# Patient Record
Sex: Female | Born: 1965 | Race: White | Hispanic: No | Marital: Married
Health system: Midwestern US, Academic
[De-identification: ages and names within clinical notes are randomized; demographics above are authoritative.]

## PROBLEM LIST (undated history)

## (undated) DIAGNOSIS — F1010 Alcohol abuse, uncomplicated: Secondary | ICD-10-CM

## (undated) HISTORY — PX: GASTRIC BYPASS: SHX52

## (undated) LAB — HM PAP SMEAR: HM Pap smear: NORMAL

---

## 2011-08-23 ENCOUNTER — Ambulatory Visit: Payer: PRIVATE HEALTH INSURANCE

## 2011-08-30 ENCOUNTER — Ambulatory Visit: Admit: 2011-08-30 | Payer: PRIVATE HEALTH INSURANCE

## 2011-08-30 DIAGNOSIS — Z0000 Encounter for general adult medical examination without abnormal findings: Secondary | ICD-10-CM

## 2011-08-30 MED ORDER — fluticasone (FLONASE) 50 mcg/actuation nasal spray
50 | Freq: Every day | NASAL | Status: AC
Start: 2011-08-30 — End: 2012-07-23

## 2011-08-30 MED ORDER — ALPRAZolam (XANAX) 0.25 MG tablet
0.25 | ORAL_TABLET | ORAL | Status: AC
Start: 2011-08-30 — End: 2012-07-23

## 2011-08-30 MED ORDER — ZOLMitriptan (ZOMIG) 5 MG tablet
5 | ORAL_TABLET | ORAL | 1.00 refills | 28.00000 days | Status: AC | PRN
Start: 2011-08-30 — End: 2014-09-28

## 2011-08-30 MED ORDER — valACYclovir (VALTREX) 1000 MG tablet
1000 | ORAL_TABLET | ORAL | Status: AC
Start: 2011-08-30 — End: 2012-07-23

## 2011-08-30 NOTE — Unmapped (Signed)
Subjective  HPI:   Patient ID: Sherry Compton is a 45 y.o. female.    Chief Complaint:  HPI       Needs to establish MD    Has not had a physical for many yrs    C/o migraine headache ,5-6 times a year   Usually excedrin helps but not in market any more  She has taken her sister's zomig which helps    Gets fever blisters 6-8 times a year,last time she received a cream  Andit did not help the sore    C/o anxiety, more lately  Her husband had  An heart attack recently  She received anxiety pills to help he fly   h/o allergies all her life,sxworse recenlty and thinks it is because she has got older  C/o watery  ,runny eyes ,runny nose,has throat itchy  Recently  got sinus cold medicine with not much help         ROS:   Review of Systems   Constitutional: Negative for fever, activity change, appetite change, fatigue and unexpected weight change.   HENT: Negative for ear pain, congestion, sneezing, postnasal drip and ear discharge.    Eyes: Negative for itching.   Respiratory: Negative for cough, chest tightness and wheezing.    Cardiovascular: Negative for chest pain, palpitations and leg swelling.   Gastrointestinal: Negative for nausea, vomiting and abdominal pain.   Genitourinary: Negative for urgency and decreased urine volume.   Musculoskeletal: Negative for back pain.   Neurological: Positive for headaches. Negative for tremors, seizures, syncope, speech difficulty, light-headedness and numbness.   Hematological: Negative for adenopathy.   Psychiatric/Behavioral: Negative for confusion, disturbed wake/sleep cycle and dysphoric mood.          Objective:   Physical Exam   Constitutional: She is oriented to person, place, and time. She appears well-developed and well-nourished.   HENT:   Head: Normocephalic and atraumatic.   Right Ear: External ear normal.   Left Ear: External ear normal.   Nose: Nose normal.   Mouth/Throat: Oropharynx is clear and moist. No oropharyngeal exudate.        Nares boggy and congested      Eyes: Conjunctivae are normal. Pupils are equal, round, and reactive to light.   Neck: Normal range of motion. Neck supple.   Cardiovascular: Normal rate, regular rhythm, normal heart sounds and intact distal pulses.    No murmur heard.  Pulmonary/Chest: Effort normal and breath sounds normal. No respiratory distress. She has no wheezes. She exhibits no tenderness.   Abdominal: Soft. Bowel sounds are normal. She exhibits no distension and no mass. There is no tenderness. There is no rebound and no guarding.   Musculoskeletal: Normal range of motion. She exhibits no edema and no tenderness.   Neurological: She is alert and oriented to person, place, and time. She has normal reflexes. She displays normal reflexes. No cranial nerve deficit. She exhibits normal muscle tone. Coordination normal.   Skin: Skin is warm.   Psychiatric: She has a normal mood and affect. Her behavior is normal. Judgment and thought content normal.             Filed Vitals:    08/30/11 1534   BP: 120/80   Pulse: 68   Temp: 97.7 ??F (36.5 ??C)   TempSrc: Oral   Height: 5' 9 (1.753 m)   Weight: 255 lb (115.667 kg)   SpO2: 99%     Body mass index is 37.66 kg/(m^2).  Body  surface area is 2.37 meters squared.                Assessment/Plan:     Anxiety rx for xanaxgiven  Headache ,migraine zomig rx given    Allergic rhinits rx for flonase given    feve rblisters rx for valtrex  Follow up for cpe

## 2011-08-30 NOTE — Unmapped (Signed)
Take zomig at start of headache    Take dAILY ZYRTEC 10mg   And flonase    Xanax as needed prior to flight  Valtrex as directed for feve rblisters  F/u for cpe

## 2012-07-23 ENCOUNTER — Ambulatory Visit: Admit: 2012-07-23 | Payer: PRIVATE HEALTH INSURANCE

## 2012-07-23 DIAGNOSIS — J019 Acute sinusitis, unspecified: Secondary | ICD-10-CM

## 2012-07-23 MED ORDER — valACYclovir (VALTREX) 1000 MG tablet
1000 | ORAL_TABLET | Freq: Two times a day (BID) | ORAL | Status: AC
Start: 2012-07-23 — End: 2014-03-01

## 2012-07-23 MED ORDER — ALPRAZolam (XANAX) 0.25 MG tablet
0.25 | ORAL_TABLET | ORAL | Status: AC
Start: 2012-07-23 — End: 2014-03-01

## 2012-07-23 NOTE — Unmapped (Signed)
Subjective  HPI:   Patient ID: Sherry Compton is a 46 y.o. female.    Chief Complaint:  HPI Comments: Fever blisters in mouth - prev used valtrex. Worked well. Needs refills.    Sinus - cough prod green stuff. 4 days. Runny nose. Sinus pressure.  Nothing otc    Going on trip soon - caving. Anxious. Would like refill of xanax due to anxiety going in [place].            Sinusitis    Anxiety                        ROS:   Review of Systems  No fevers, chills, sob, wheezing, n/v/d.     Objective:   Physical Exam      nad  unlabored  ctab  Rrr, no m  Sinuses ttp  Rhinorrhea, boggy turbinates  Tm and canals normal bilat, normal ext ears.  Tearful affect    Filed Vitals:    07/23/12 1305   BP: 110/76   Pulse: 84   Temp: 98.5 ??F (36.9 ??C)   TempSrc: Oral   Height: 5' 9 (1.753 m)   Weight: 276 lb (125.193 kg)   SpO2: 98%     Body mass index is 40.76 kg/(m^2).  Body surface area is 2.47 meters squared.                Assessment/Plan:   There is no problem list on file for this patient.  Acute sinusitis - handout on otc remedies for symptoms. D/w pt no need for abx. Call if persisting after 14 days and I'll call in an abx.    Anxiety - Rx refill x1 of xanax. F/u with Dr. August Saucer to discuss anxiety more.    Herpes labialis - valtrex refill.    Reviewed medications with the patient and she understands how to take them and has no barriers to medication compliance.

## 2012-07-23 NOTE — Unmapped (Signed)
Patient needs refill on fever blister medication, she has no idea what its called     [facility], [address]  [phone]

## 2012-07-23 NOTE — Unmapped (Signed)
Call if not improving by 2 weeks.    Please follow up with Dr. August Saucer to discuss depression and anxiety.

## 2012-10-21 NOTE — Unmapped (Signed)
Orders inc hart

## 2012-10-21 NOTE — Unmapped (Signed)
Patient is requesting lab orders because she wants to get them done before her appt tomorrow at 10am and she said dr. August Saucer had ordered labs for her last year but they are expired and she never had them done so she basically just wants to get those done. She would like a phone call letting her know this has been entered into the system because she is planning to go there in the morning before the appt. She is going to [facility] lab

## 2012-10-22 ENCOUNTER — Ambulatory Visit: Admit: 2012-10-22 | Payer: PRIVATE HEALTH INSURANCE

## 2012-10-22 ENCOUNTER — Inpatient Hospital Stay: Admit: 2012-10-22 | Payer: PRIVATE HEALTH INSURANCE

## 2012-10-22 DIAGNOSIS — F32A Depression, unspecified: Secondary | ICD-10-CM

## 2012-10-22 DIAGNOSIS — Z0000 Encounter for general adult medical examination without abnormal findings: Secondary | ICD-10-CM

## 2012-10-22 LAB — HEMOGLOBIN A1C: Hemoglobin A1C: 4.8 % (ref 4.8–6.4)

## 2012-10-22 LAB — LIPID PANEL
Cholesterol, Total: 164 mg/dL (ref 0–200)
HDL: 50 mg/dL (ref 30–60)
LDL Cholesterol: 90 mg/dL (ref 0–160)
Triglycerides: 119 mg/dL (ref 10–150)

## 2012-10-22 LAB — CBC
Hematocrit: 39.2 % (ref 35.0–45.0)
Hemoglobin: 12.9 g/dL (ref 11.7–15.5)
MCH: 30.6 pg (ref 27.0–33.0)
MCHC: 32.8 g/dL (ref 32.0–36.0)
MCV: 93.2 fL (ref 80.0–100.0)
MPV: 8.3 fL (ref 7.5–11.5)
Platelets: 250 10*3/uL (ref 140–400)
RBC: 4.2 10*6/uL (ref 3.80–5.10)
RDW: 14.5 % (ref 11.0–15.0)
WBC: 7.2 10*3/uL (ref 3.8–10.8)

## 2012-10-22 LAB — VITAMIN D 25 HYDROXY: Vit D, 25-Hydroxy: 19 ng/mL (ref 30.0–100)

## 2012-10-22 LAB — TSH: TSH: 2.53 m[IU]/L (ref 0.45–4.50)

## 2012-10-22 MED ORDER — FLUoxetine (PROZAC) 20 MG capsule
20 | ORAL_CAPSULE | Freq: Every day | ORAL | Status: AC
Start: 2012-10-22 — End: 2012-11-12

## 2012-10-22 NOTE — Unmapped (Signed)
Pt here now for an appt with dr August Saucer

## 2012-10-22 NOTE — Unmapped (Signed)
lmtcb

## 2012-10-22 NOTE — Unmapped (Addendum)
Start prozac,schedule for psychology appt.  If not affordable  Continue your present peer to peer counseling.  F/u in 4 weeks

## 2012-10-22 NOTE — Unmapped (Signed)
Subjective  HPI:   Patient ID: Sherry Compton is a 46 y.o. female.    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  She has 46 yr old in school ,married last yr majoring in Environmental manager.ever since he got married she thinks her dtr in law  turned him away from family,they never come down to visit them  Initially he rdtr in law was very nice to them and then there was an incident at he rgraduation party in may and since then thinks chamged.  They are [place] family and very close knit family.  She thought this was separation and has been counseling with a Librarian, academic and not  gettign better and feels she has lost he rson and wants to let go and knows he will come back ,he is immature and they will come back to her  She still calls her son once  In 1-2 weeks  Her son and dtr in law  visit her dtr in law's sideof family who live in [place] and drive past their home and do not drop by to see them.  NAd if they come home it is for few minutes  When she asked her son why the do not  Visit them  he said he is not comfortable at their home.    Past Medical History   Diagnosis Date   ??? Anxiety      Past Surgical History   Procedure Laterality Date   ??? Augmentation breast endoscopic       History     Social History   ??? Marital Status: Married     Spouse Name: N/A     Number of Children: N/A   ??? Years of Education: N/A     Occupational History   ??? Not on file.     Social History Main Topics   ??? Smoking status: Former Smoker   ??? Smokeless tobacco: Not on file   ???  Alcohol Use: No   ??? Drug Use: No   ??? Sexually Active: Not on file     Other Topics Concern   ??? Caffeine Use Yes   ??? Exercise No   ??? Seat Belt Yes     Social History Narrative   ??? No narrative on file     Family History   Problem Relation Age of Onset   ??? Migraines Sister          Medications:  Current Outpatient Prescriptions   Medication Sig Dispense Refill   ??? ALPRAZolam (XANAX) 0.25 MG tablet 1-2 tablets 1/2 hr prior to flight  30 tablet  0   ??? valACYclovir (VALTREX) 1000 MG tablet Take 1 tablet (1,000 mg total) by mouth 2 times a day. For 3 days  6 tablet  2   ??? ZOLMitriptan (ZOMIG) 5 MG tablet Take 1 tablet (5  mg total) by mouth as needed for Migraine.  10 tablet  5     No current facility-administered medications for this visit.        ROS:   Review of Systems   Constitutional: Negative for fatigue.   Respiratory: Negative for shortness of breath.    Cardiovascular: Negative for chest pain and palpitations.   Psychiatric/Behavioral: Positive for sleep disturbance and dysphoric mood. The patient is nervous/anxious. The patient is not hyperactive.           Objective:   Physical Exam   Vitals reviewed.  Constitutional: She is oriented to person, place, and time. She appears well-developed and well-nourished.   HENT:   Head: Normocephalic and atraumatic.   Eyes: Conjunctivae and EOM are normal. Pupils are equal, round, and reactive to light.   Neck: Normal range of motion. Neck supple.   Cardiovascular: Normal rate, regular rhythm, normal heart sounds and intact distal pulses.    No murmur heard.  Pulmonary/Chest: Effort normal and breath sounds normal.   Abdominal: Soft.   Neurological: She is alert and oriented to person, place, and time.   Psychiatric: Her speech is normal and behavior is normal. Judgment and thought content normal. Her mood appears anxious. Cognition and memory are normal.   tearful             Filed Vitals:    10/22/12 0957   BP: 124/78   Pulse: 79   Temp: 97.4 ??F (36.3 ??C)   TempSrc: Oral    Height: 5' 9 (1.753 m)   Weight: 293 lb (132.904 kg)   SpO2: 98%     Body mass index is 43.25 kg/(m^2).  Body surface area is 2.54 meters squared.           See ph9  Score       Assessment/Plan:     Depression with adjustment disorder  See pt isntructions  Total time spent with pt 09-1044 am of which more than 50% in ftfc/cc  Pt given counseling   Recommended psychotherpay  rx for prozac given

## 2012-10-22 NOTE — Unmapped (Signed)
Patient says dr. August Saucer gave her a prescription but she does not have it. She wants to know if it was being sent to the pharmacy and where it was sent. She only has her appt summary but no prescription.

## 2012-10-23 NOTE — Unmapped (Signed)
LMTCB.  Prozac was refilled on 10/30 to [facility] on [address].

## 2012-10-23 NOTE — Unmapped (Signed)
Pt informed rx sent to [facility]

## 2012-11-12 MED ORDER — FLUoxetine (PROZAC) 20 MG capsule
20 | ORAL_CAPSULE | Freq: Two times a day (BID) | ORAL | Status: AC
Start: 2012-11-12 — End: 2014-09-28

## 2012-11-12 NOTE — Unmapped (Signed)
Was supposed to take once aday ,does she want to continue 2 times daily ot one time a day.

## 2012-11-12 NOTE — Unmapped (Signed)
Pt states she was taking the med once a day for 2 weeks and it didn't help at all, so she try taking it bid and she feels much better. Advised pt to f/u with dr August Saucer at her scheduled time and she would sent a refill to the pharm.

## 2012-11-12 NOTE — Unmapped (Signed)
Wanting to clarify the dosage for this patient from dr August Saucer for   FLUoxetine (PROZAC) 20 MG capsule patient is stating she is taking two a day, and now trying to get her script filled and it is too early,because the prescription states she should be taking only (1) a day.

## 2014-03-01 ENCOUNTER — Ambulatory Visit: Admit: 2014-03-01 | Payer: PRIVATE HEALTH INSURANCE

## 2014-03-01 MED ORDER — ZOLMitriptan (ZOMIG) 5 MG tablet
5 | ORAL_TABLET | Freq: Once | ORAL | 1.00 refills | 28.00000 days | Status: AC | PRN
Start: 2014-03-01 — End: 2014-09-28

## 2014-03-01 MED ORDER — ALPRAZolam (XANAX) 0.25 MG tablet
0.25 | ORAL_TABLET | ORAL | Status: AC
Start: 2014-03-01 — End: 2014-09-28

## 2014-03-01 MED ORDER — valACYclovir (VALTREX) 1000 MG tablet
1000 | ORAL_TABLET | Freq: Two times a day (BID) | ORAL | Status: AC
Start: 2014-03-01 — End: 2015-02-07

## 2014-03-01 NOTE — Unmapped (Signed)
Schedule appt to see bariatric  Doctor     continue valtrex and  zomig  F/u for physical

## 2014-03-01 NOTE — Unmapped (Signed)
Subjective  HPI:   Patient ID: Sherry Compton is a 48 y.o. female.    Chief Complaint:  HPI       Here for follow up   needs refill  Fever blister gets one in every 8 weeks     flying to [place] soon and would like refill of her ativan   wondering  about her diet   used adipex in past  H/o migraine doing ok needs refill  Past Medical History   Diagnosis Date   ??? Anxiety      Past Surgical History   Procedure Laterality Date   ??? Augmentation breast endoscopic       History     Social History   ??? Marital Status: Married     Spouse Name: N/A     Number of Children: N/A   ??? Years of Education: N/A     Occupational History   ??? Not on file.     Social History Main Topics   ??? Smoking status: Former Smoker   ??? Smokeless tobacco: Not on file   ??? Alcohol Use: No   ??? Drug Use: No   ??? Sexual Activity: Not on file     Other Topics Concern   ??? Caffeine Use Yes   ??? Exercise No   ??? Seat Belt Yes     Social History Narrative   ??? No narrative on file     Family History   Problem Relation Age of Onset   ??? Migraines Sister    ??? Diabetes Paternal Grandmother    ??? Heart disease Paternal Grandmother    ??? Miscarriages / Stillbirths Paternal Grandmother          Medications:  Current Outpatient Prescriptions   Medication Sig Dispense Refill   ??? ALPRAZolam (XANAX) 0.25 MG tablet 1-2 tablets 1/2 hr prior to flight  30 tablet  0   ??? FLUoxetine (PROZAC) 20 MG capsule Take 1 capsule (20 mg total) by mouth 2 times a day.  60 capsule  1   ??? valACYclovir (VALTREX) 1000 MG tablet Take 1 tablet (1,000 mg total) by mouth 2 times a day. For 3 days  6 tablet  2   ??? ZOLMitriptan (ZOMIG) 5 MG tablet Take 5 mg by mouth once as needed for Migraine.       ??? ZOLMitriptan (ZOMIG) 5 MG tablet Take 1 tablet (5 mg total) by mouth as needed for Migraine.  10 tablet  5     No current facility-administered medications for this visit.        ROS:   Review of Systems   Constitutional: Negative for fever and fatigue.   Respiratory: Negative for chest tightness and  shortness of breath.    Neurological: Negative for dizziness and light-headedness.          Objective:   Physical Exam   Vitals reviewed.  Constitutional: She appears well-developed and well-nourished.   Cardiovascular: Normal rate, regular rhythm, normal heart sounds and intact distal pulses.    No murmur heard.  Pulmonary/Chest: Effort normal and breath sounds normal.   Skin:   Fever blister on uppe rlip   Psychiatric: She has a normal mood and affect. Her behavior is normal.             Filed Vitals:    03/01/14 1002   BP: 118/88   Pulse: 88   Temp: 98.1 ??F (36.7 ??C)   TempSrc: Oral   Height: 5' 9.5 (  1.765 m)   Weight: 296 lb (134.265 kg)   SpO2: 98%     Body mass index is 43.1 kg/(m^2).  Body surface area is 2.57 meters squared.                Assessment/Plan:     Fever blister  rx for valtrex given   migraines controlled refilled zomig    Obesity referral given   anxiety to [place] rx given

## 2014-09-13 NOTE — Unmapped (Signed)
Called stating is requesting medical records from dr August Saucer

## 2014-09-13 NOTE — Unmapped (Signed)
Ok  Make sure she signs ror

## 2014-09-14 NOTE — Unmapped (Signed)
LM-pt must sign release of records and let us know who we will be forwarding the records to.

## 2014-09-22 NOTE — Unmapped (Signed)
Pt called back -  I informed her that the best way to do this would be for her to sign a records release stating that we're sending records to a new doc.  She is going to come in and sign the release.

## 2014-09-28 ENCOUNTER — Ambulatory Visit: Admit: 2014-09-28 | Payer: PRIVATE HEALTH INSURANCE

## 2014-09-28 DIAGNOSIS — E663 Overweight: Secondary | ICD-10-CM

## 2014-09-28 MED ORDER — ALPRAZolam (XANAX) 0.25 MG tablet
0.25 | ORAL_TABLET | ORAL | Status: AC
Start: 2014-09-28 — End: 2015-02-07

## 2014-09-28 MED ORDER — ZOLMitriptan (ZOMIG) 5 MG tablet
5 | ORAL_TABLET | ORAL | 1.00 refills | 28.00000 days | Status: AC | PRN
Start: 2014-09-28 — End: 2014-11-15

## 2014-09-28 NOTE — Unmapped (Signed)
Subjective  HPI:   Patient ID: Sherry Compton is a 48 y.o. female.    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 Anxiety      Past Surgical History   Procedure Laterality Date   ??? Augmentation breast endoscopic       History     Social History   ??? Marital Status: Married     Spouse Name: N/A     Number of Children: N/A   ??? Years of Education: N/A     Occupational History   ??? Not on file.     Social History Main Topics   ??? Smoking status: Former Smoker   ??? Smokeless tobacco: Not on file   ??? Alcohol Use: No   ??? Drug Use: No   ??? Sexual Activity: Not on file     Other Topics Concern   ??? Caffeine Use Yes   ??? Exercise No   ??? Seat Belt Yes     Social History Narrative   ??? No narrative on file     Family History   Problem Relation Age of Onset   ??? Migraines Sister    ??? Diabetes Paternal Grandmother    ??? Heart disease Paternal Grandmother    ??? Miscarriages / Stillbirths Paternal Grandmother          Medications:  Current Outpatient Prescriptions   Medication Sig Dispense Refill   ??? ALPRAZolam (XANAX) 0.25 MG tablet 1-2 tablets 1/2 hr prior to flight  30 tablet  2   ??? valACYclovir (VALTREX) 1000 MG tablet Take 1 tablet (1,000 mg total) by mouth 2 times a day. For 3 days  30 tablet  2   ??? ZOLMitriptan (ZOMIG) 5 MG tablet Take 1 tablet (5 mg total) by mouth as needed for Migraine.  10 tablet  5   ??? [DISCONTINUED] ZOLMitriptan (ZOMIG) 5 MG tablet Take 1 tablet (5 mg total) by mouth as needed for Migraine.  10 tablet  5     No current facility-administered medications for this visit.        ROS:   Review of  Systems   Constitutional: Positive for weight gain. Negative for weight loss and fatigue.   Gastrointestinal: Negative for abdominal pain.   Neurological: Negative for dizziness and headaches.          Objective:   Physical Exam   Vitals reviewed.  Constitutional: She is oriented to person, place, and time. She appears well-developed and well-nourished.   Neck: Normal range of motion. Neck supple.   Cardiovascular: Normal rate, regular rhythm, normal heart sounds and intact distal pulses.    No murmur heard.  Pulmonary/Chest: Effort normal and breath sounds normal.   Abdominal: Soft.   Neurological: She is alert and oriented to person, place, and time.   Psychiatric: She has a normal mood and affect. Her behavior is normal. Judgment and thought content normal.  Filed Vitals:    09/28/14 1515   BP: 128/76   Pulse: 72   Temp: 98.4 ??F (36.9 ??C)   TempSrc: Oral   Height: 5' 9 (1.753 m)   Weight: 292 lb (132.45 kg)   SpO2: 98%     Body mass index is 43.1 kg/(m^2).  Body surface area is 2.54 meters squared.                Assessment/Plan:   Anxiety controlled ,prn xanax   has not been taking prozac  Morbid obesity referrla to bariatric surgery given    Migraines doing well using zomig as needed

## 2014-09-28 NOTE — Unmapped (Signed)
F/u in 3months for physical

## 2014-11-15 ENCOUNTER — Ambulatory Visit: Admit: 2014-11-15 | Payer: PRIVATE HEALTH INSURANCE

## 2014-11-15 ENCOUNTER — Inpatient Hospital Stay: Admit: 2014-11-15 | Payer: PRIVATE HEALTH INSURANCE

## 2014-11-15 DIAGNOSIS — M1711 Unilateral primary osteoarthritis, right knee: Secondary | ICD-10-CM

## 2014-11-15 DIAGNOSIS — M25561 Pain in right knee: Secondary | ICD-10-CM

## 2014-11-15 MED ORDER — naproxen (NAPROSYN) 500 MG tablet
500 | ORAL_TABLET | Freq: Two times a day (BID) | ORAL | Status: AC
Start: 2014-11-15 — End: 2015-02-07

## 2014-11-15 MED ORDER — ketorolacTORADOLinjection30mg
30 | Freq: Once | INTRAMUSCULAR | Status: AC
Start: 2014-11-15 — End: 2014-11-15
  Administered 2014-11-15: 17:00:00 30 mg via INTRAMUSCULAR

## 2014-11-15 MED ORDER — ketorolac (TORADOL) injection 30 mg
30 | Freq: Four times a day (QID) | INTRAMUSCULAR | Status: AC | PRN
Start: 2014-11-15 — End: 2014-11-15

## 2014-11-15 NOTE — Unmapped (Signed)
Get r knee xray today   ice compress 10 min 3-4 times  aday    wait for xray results

## 2014-11-15 NOTE — Unmapped (Signed)
Subjective  HPI:   Patient ID: Sherry Compton is a 48 y.o. female.    Chief Complaint:  HPI       Chief Complaint   Patient presents with   ??? Knee Pain     48 y.o female here with c/o knee pain due to injury on Thursday     Started a new job at [employer]   she works 5hrs shifts  Works in fashion department organizing stuff  2 days in row   on Thursday woke up with r knee pain  Went throughthe day    it hurt,felt like it was popped out   hurt when she slept   did not know what she did   went to work   on Saturday ,bent over to pick up stuff  r knee pain hit her again  Took ibuprofen 2 every 5-6 hrs with some releif  Pain is 8/10  When she gets up and wants on ti it subsides   layign down on bwd hurts  Past Medical History   Diagnosis Date   ??? Anxiety      Past Surgical History   Procedure Laterality Date   ??? Augmentation breast endoscopic       History     Social History   ??? Marital Status: Married     Spouse Name: N/A     Number of Children: N/A   ??? Years of Education: N/A     Occupational History   ??? Not on file.     Social History Main Topics   ??? Smoking status: Former Smoker   ??? Smokeless tobacco: Not on file   ??? Alcohol Use: No   ??? Drug Use: No   ??? Sexual Activity: Not on file     Other Topics Concern   ??? Caffeine Use Yes   ??? Exercise No   ??? Seat Belt Yes     Social History Narrative     Family History   Problem Relation Age of Onset   ??? Migraines Sister    ??? Diabetes Paternal Grandmother    ??? Heart disease Paternal Grandmother    ??? Miscarriages / Stillbirths Paternal Grandmother          Medications:  Current Outpatient Prescriptions   Medication Sig Dispense Refill   ??? ALPRAZolam (XANAX) 0.25 MG tablet 1-2 tablets 1/2 hr prior to flight 30 tablet 2   ??? valACYclovir (VALTREX) 1000 MG tablet Take 1 tablet (1,000 mg total) by mouth 2 times a day. For 3 days 30 tablet 2   ??? [DISCONTINUED] ZOLMitriptan (ZOMIG) 5 MG tablet Take 1 tablet (5 mg total) by mouth as needed for Migraine. 10 tablet 5     No current  facility-administered medications for this visit.        ROS:   Review of Systems   Constitutional: Negative for fatigue.   Musculoskeletal: Positive for myalgias, joint swelling and arthralgias. Negative for gait problem.   Neurological: Negative for weakness and numbness.          Objective:   Physical Exam   Vitals reviewed.  Constitutional: She appears well-developed and well-nourished.   Cardiovascular: Normal rate, regular rhythm, normal heart sounds and intact distal pulses.    No murmur heard.  Pulmonary/Chest: Effort normal and breath sounds normal.   Musculoskeletal: She exhibits edema and tenderness.   r knee swollen   no redness  rom decreased sec to pain  Filed Vitals:    11/15/14 1141   BP: 110/82   Pulse: 93   Temp: 98.5 ??F (36.9 ??C)   Weight: 297 lb 8 oz (134.945 kg)   SpO2: 97%     Body mass index is 43.91 kg/(m^2).  There is no height on file to calculate BSA.                Assessment/Plan:     r knee pain    poss strain  ? Arthritis  See pt instructions

## 2014-11-30 NOTE — Progress Notes (Signed)
[facility] [address] [place], [place] [address]    Acknowledgment of Informed Consent for Surgical or Medical Procedure and Sedation  I agree to allow doctor(s) TRACE W CURRY and his/her associates or assistants, including residents and/or other qualified medical practitioner to perform the following medical treatment or procedure and to administer or direct the administration of sedation as necessary:  Procedure(s) : LAPAROSCOPIC SLEEVE GASTRECTOMY WITH POSSIBLE HIATAL HERNIA REPAIR  My doctor has explained the following regarding the proposed procedure:  ??? the explanation of the procedure  ??? the benefits of the procedure  ??? the potential problems that might occur during recuperation  ??? the risks and side effects of the procedure which could include but are not limited to severe blood loss, infection, stroke or death  ??? the benefits, risks and side effect of alternative procedures including the consequences of declining this procedure or any alternative procedures  ??? the likelihood of achieving satisfactory results.  I acknowledge no guarantee or assurance has been made to me regarding the results.    I understand that during the course of this treatment/procedure, unforeseen conditions can occur which require an additional or different procedure.  I agree to allow my physician or assistants to perform such extension of the original procedure as they may find necessary.    I understand that sedation will often result in temporary impairment of memory and fine motor skills and that sedation can occasionally progress to a state of deep sedation or general anesthesia.    I understand the risks of anesthesia for surgery include, but are not limited to, sore throat, hoarseness, injury to face, mouth, or teeth; nausea; headache; injury to blood vessels or nerves; death, brain damage, or paralysis.    I understand that if I have a Limitation of Treatment order in effect during my  hospitalization, the order may or may not be in effect during this procedure.     I give my doctor permission to give me blood or blood products.  I understand that there are risks with receiving blood such as hepatitis, AIDS, fever, or allergic reaction.  I acknowledge that the risks, benefits, and alternatives of this treatment have been explained to me and that no express or implied warranty has been given by the hospital, any blood bank, or any person or entity as to the blood or blood components transfused.    At the discretion of my doctor, I agree to allow observers, equipment/product representatives and allow photographing, and/or televising of the procedure, provided my name or identity is maintained confidentially.      I agree the hospital may dispose of or use for scientific or educational purposes any tissue, fluid, or body parts which may be removed.    ________________________________Date________Time______ am/pm  (Circle One)  Patient or Signature of Closest Relative or Legal Guardian    ________________________________Date________Time______am/pm      Page 1 of ____  Witness

## 2014-12-02 ENCOUNTER — Inpatient Hospital Stay: Admit: 2014-12-02 | Attending: Surgery

## 2014-12-02 LAB — CBC
Hematocrit: 37 % (ref 36.0–48.0)
Hemoglobin: 12.1 g/dL (ref 12.0–16.0)
MCH: 28.4 pg (ref 26.0–34.0)
MCHC: 32.7 g/dL (ref 31.0–36.0)
MCV: 87 fL (ref 80.0–100.0)
MPV: 7.2 fL (ref 5.0–10.5)
Platelets: 297 10*3/uL (ref 135–450)
RBC: 4.25 M/uL (ref 4.00–5.20)
RDW: 15.7 % — ABNORMAL HIGH (ref 12.4–15.4)
WBC: 6.2 10*3/uL (ref 4.0–11.0)

## 2014-12-02 LAB — COMPREHENSIVE METABOLIC PANEL
ALT: 11 U/L (ref 10–40)
AST: 13 U/L — ABNORMAL LOW (ref 15–37)
Albumin/Globulin Ratio: 1.4 (ref 1.1–2.2)
Albumin: 3.8 g/dL (ref 3.4–5.0)
Alkaline Phosphatase: 67 U/L (ref 40–129)
Anion Gap: 13 (ref 3–16)
BUN: 13 mg/dL (ref 7–20)
CO2: 23 mmol/L (ref 21–32)
Calcium: 8.7 mg/dL (ref 8.3–10.6)
Chloride: 102 mmol/L (ref 99–110)
Creatinine: 0.7 mg/dL (ref 0.6–1.1)
GFR African American: >60 (ref >60)
GFR Non-African American: >60 (ref >60)
Globulin: 2.7 g/dL
Glucose: 100 mg/dL — ABNORMAL HIGH (ref 70–99)
Potassium: 4.2 mmol/L (ref 3.5–5.1)
Sodium: 138 mmol/L (ref 136–145)
Total Bilirubin: <0.2 mg/dL (ref 0.0–1.0)
Total Protein: 6.5 g/dL (ref 6.4–8.2)

## 2014-12-02 LAB — TYPE AND SCREEN
ABO/Rh: A NEG
Antibody Screen: NEGATIVE

## 2014-12-02 NOTE — Progress Notes (Signed)
The following educational items and goals will be achieved upon completion of the patient's Pre-admission testing appointment:             Identify the learner who is being assessed for education:  Patient                       Ability to Learn:  Exhibits ability to grasp concepts and respond to questions: High  Ready to Learn: Yes  calm   Preferred Method of Learning:  verbal  Barriers to Learning: Verbalizes interest  Special Considerations due to cultural, religious, spiritual beliefs:  No  Language:  English  Language Interpreter:  No    [place]  _0  Appropriate evaluation / integration of data as delineated by ASPAN Standards of Perianesthesia Nursing Practice    Pain scale and pain management   _1 Patient verbalizes understanding of pain scale and pain management  _2 Pre-operative determination of patient???s anticipated Post-Operative pain goal:   4 of 10 on 10 point scale post op goal  _3  Other     Medication(s) - Compliance with preop medication instructions  _4  Patient verbalizes understanding of preop medications (see [facility] Presurgical Instructions)    Instructions, Pre op                                                                                            _5  Patient verbalizes understanding of presurgical instructions as reviewed with phone interview nurse or in-person nurse review    Fall Risk Potential, Preoperatively                                                                                   _6 No preoperative risk identified  _7 Preop risk identified:                    _8 Sensory deficit        _9 Motor deficit        _10 Balance problem        _11 Home medication        _12 Uses assistive device                    _13 History of a Fall within the last 30 days    Goal(s) for fall prevention:  _14 Prevent fall or injury by requesting assistance with activities of daily living  _15 Patient / Significant other verbalizes understanding the need to call for  assistance prior to getting out of bed during hospitalization      Infection Precautions                                                                                            [  x] Patient understands implementation of Surgical Site Infection precautions (see [facility] Pres[facility] Instructions)     Patient Safety  [x]  Patient identification verified  [x]  Site verified    Instructions - Discharge Planning for Outpatients  [x]  Patient / significant other voices understanding of home care and follow up procedures  [x]  Encourage patient / significant other to review discharge instructions the day after procedure due to sedation on day of surgery    Anticipated Special Needs upon discharge:        []  Cooling device        []  Crutches       []  Walker        []  Wound Support device        []  Drain        []  Other       Instructions - Discharge Planning for Admitted patients  [x]  Patient / significant other understands plan for admission after surgery  [x]  Patient / significant other understands plan for anticipated discharge dispostion        12/02/2014 12:07 PM Tami Nichols

## 2014-12-02 NOTE — Anesthesia Pre-Procedure Evaluation (Signed)
Department of Anesthesiology  Preprocedure Note       Name:  Tami Nichols   Age:  48 y.o.  DOB:  04/01/1966                                          MRN:  [id number]         Date:  12/02/2014      Surgeon: Clementeen Grahamurry    Procedure: Laparoscopic Sleeve Gastrectomy    Medications prior to admission:   Prior to Admission medications    Medication Sig Start Date End Date Taking? Authorizing Provider   ALPRAZolam (XANAX) 0.25 MG tablet Take 0.25 mg by mouth nightly as needed for Sleep   Yes Historical Provider, MD   ZOLMitriptan (ZOMIG) 5 MG tablet Take 5 mg by mouth as needed for Migraine   Yes Historical Provider, MD   naproxen (NAPROSYN) 500 MG tablet Take 500 mg by mouth 2 times daily (with meals)   Yes Historical Provider, MD   cetirizine (ZYRTEC) 10 MG tablet Take 10 mg by mouth daily   Yes Historical Provider, MD   naltrexone-bupropion HCl ER (CONTRAVE) 8-90 MG per tablet Take 2 tablets by mouth 2 times daily   Yes Historical Provider, MD       Current medications:    Current Outpatient Prescriptions   Medication Sig Dispense Refill   ??? ALPRAZolam (XANAX) 0.25 MG tablet Take 0.25 mg by mouth nightly as needed for Sleep     ??? ZOLMitriptan (ZOMIG) 5 MG tablet Take 5 mg by mouth as needed for Migraine     ??? naproxen (NAPROSYN) 500 MG tablet Take 500 mg by mouth 2 times daily (with meals)     ??? cetirizine (ZYRTEC) 10 MG tablet Take 10 mg by mouth daily     ??? naltrexone-bupropion HCl ER (CONTRAVE) 8-90 MG per tablet Take 2 tablets by mouth 2 times daily       No current facility-administered medications for this encounter.       Allergies:  No Known Allergies    Problem List:  There is no problem list on file for this patient.      Past Medical History:        Diagnosis Date   ??? Anxiety        Past Surgical History:        Procedure Laterality Date   ??? Breast surgery       augmentation       Social History:    History   Substance Use Topics   ??? Smoking status: Never Smoker    ??? Smokeless tobacco: Not on file   ??? Alcohol Use:  No                                Counseling given: Not Answered      Vital Signs (Current):   Filed Vitals:    12/02/14 1153   Pulse: 82   Temp: 97.3 ??F (36.3 ??C)   TempSrc: Oral   Resp: 20   Height: 5\' 9"  (1.753 m)   Weight: 299 lb 5 oz (135.767 kg)   SpO2: 97%  BP Readings from Last 3 Encounters:   No data found for BP       NPO Status:                                                                                 BMI:   Wt Readings from Last 3 Encounters:   12/02/14 299 lb 5 oz (135.767 kg)     Body mass index is 44.18 kg/(m^2).    Anesthesia Evaluation  Patient summary reviewed and Nursing notes reviewed no history of anesthetic complications:   Airway: Mallampati: II     Neck ROM: full  Mouth opening: > = 3 FB Dental:          Pulmonary:       (-) pneumonia, COPD, asthma, shortness of breath, recent URI and sleep apnea       Cardiovascular:        (-) pacemaker, hypertension, valvular problems/murmurs, past MI, CAD, CABG/stent, dysrhythmias,  angina,  CHF, orthopnea, PND and  DOE             Neuro/Psych:      (-) seizures, neuromuscular disease, TIA, CVA, headaches and psychiatric history  GI/Hepatic/Renal:        (-) hiatal hernia, GERD, PUD, hepatitis, liver disease and bowel prep     Endo/Other:        (-) hypothyroidism, hyperthyroidism, blood dyscrasia    Abdominal:                    Anesthesia Plan    ASA 3     general   (48 year old female presents for laparoscopic sleeve gastrectomy.  Plan general anesthesia with ASA standard monitors.  Questions answered.  Patient agreeable with anesthetic plan.  )  intravenous induction   Anesthetic plan and risks discussed with patient.      Weyman CroonJAMES R Trey Bebee, MD   12/02/2014

## 2014-12-02 NOTE — Progress Notes (Signed)
[facility] PRE-SURGICAL TESTING INSTRUCTIONS     Date of Procedure 12-21 Time of Procedure1100    PRIOR TO PROCEDURE DATE:  1. Arrange for someone to take you home and be with you after discharge since you cannot drive after receiving sedation. Please ensure it is someone we can share information with regarding your discharge.    2. Contact your doctor for advice if:       a. You are taking any blood thinners, aspirin, anti-inflammatory or vitamin E products.       b. There is a change in your physical state such as a cold, fever, rash, cuts, sores or infection, especially near your surgical site.    3. Do not drink alcohol the day before your surgery.    4. Please follow guidelines prior to surgery for diet, medication, or preparations as advised by your doctor.    5. FOLLOW INSTRUCTIONS FOR ARRIVAL TIME AS DIRECTED BY YOUR DOCTOR.  If your doctor does not give you a specific arrival time, please arrive at 0900.    6.  If there is no one at the front desk when you arrive, please call SDS at [phone] to  let us know you have arrived.  You will not need to register again.    THE DAY OF YOUR PROCEDURE:  1. On the day of surgery, enter the MAIN entrance [address] [facility] [address] and follow the signs to the MAIN entrance and Valet parking.      2. Check in with the receptionist inside the lobby or follow the signs posted on the floor and walls to go directly into Same Day Surgery/Services located on the same floor as the lobby.  The phone number for SDS is [phone].    3. DO NOT EAT OR DRINK ANYTHING AFTER MIDNIGHT. The only exception would be a small sip of water with any medications you were told to take the morning of surgery.        a. Medication instructions for the day of surgery:          b. Please contact your Diabetic Physician to receive instructions regarding your insulin for the night before and the day of surgery.    5. Do not swallow water when brushing teeth. No gum, candy, mints or ice chips. Refrain  from smoking or at least decrease the amount.    6. Dress in loose, comfortable clothing appropriate for redressing after your procedure.  Do not wear jewelry, make-up-especially NO eye make-up, fingernail polish, lotion, powders or metal hairclips.    7. Dentures, glasses, or contacts will need to be removed before surgery. Bring cases for your glasses, contacts, dentures, or hearing aids.  If you use a CPAP, please bring it with you the day of surgery.    8. Leave any valuables at home such as credit cards, cash, cell phones and jewelry. The hospital will not be responsible for valuables that are not secured in the hospital safe.    9. You may bring a bag with personal items if you are to spend the night. Please have any large items you may need brought in by your family after your arrival to your hospital room.    10. Please bring a copy of any history and physical form, or medical reports your doctor may have given you.    11. If you have a Living Will or Durable Power of Attorney, please bring a copy on the day of your procedure.  HOW WE KEEP YOU SAFE and WORK TO PREVENT SURGICAL SITE INFECTIONS:  1. Health care workers should always check your ID bracelet to verify your name and birth date. You will be asked many times to state your name, date of birth, and allergies.    2. Health care workers should always clean their hands with soap or alcohol gel before providing care to you. It is okay to ask anyone if they cleaned their hands before they touch you.    3. You will be actively involved in verifying the type of surgery you are having and ensuring the correct surgical site is confirmed.    4. Do not shave near where you will have surgery. Shaving with a razor can irritate your skin and make it easier to develop an infection. On the day of your procedure, any hair that needs to be removed near the surgical site will be ???clipped??? by a Research scientist (physical sciences)healthcare worker using a special clippers designed to avoid skin  irritation.    5. When you are in the operating room, your surgical site will be cleansed with a special soap and in most cases you will be given an antibiotic before the surgery begins.    AFTER YOUR PROCEDURE:  1. For comfort and safety, arrange to have someone at home with you for the first 24 hours after discharge.    2. You and your family will be given written instructions about your diet, activity, dressing care, medications, and return visits.    3. Always clean your hands before and after caring for your wound.    4. Mild nausea, headache, muscle aches, sore throat, or fatigue may occur after anesthesia. Should any of these symptoms become severe, or should you notice any signs of infection, you should call your doctor.    5. Narcotic pain medications can cause significant constipation.  You may want to add a stool softener to your postoperative medication schedule or speak to your surgeon on how best to manage this SIDE EFFECT.    SPECIAL INSTRUCTIONS     ADDITIONAL INFORMATION REVIEWED:  Yes Taking Control of Your Pain   No Cardiac Surgery Instructions for AM admission to the hospital  Yes FAQs about ???Surgical Site Infections ???  No Your Guide to Hip Replacement Surgery. Please bring this booklet  back on the day of your surgery.  No Your Guide to Knee Replacement Surgery. Please bring this booklet back on the day of your surgery.  Yes Same Day Services Booklet   No Hibiclens?? Bathing Instructions   No Other    Thank you for allowing us to care for you.  We strive to exceed your expectations in the overall delivery of care and service to you and your family.    If you need to contact us for any reason, please call us at [phone]  Shandrell Boda.12/02/2014 .12:09 PM    Instructions reviewed and copy given to patient during visit.

## 2014-12-03 LAB — EKG 12-LEAD
Atrial Rate: 68 {beats}/min
P Axis: 49 degrees
P-R Interval: 172 ms
Q-T Interval: 376 ms
QRS Duration: 98 ms
QTc Calculation (Bazett): 399 ms
R Axis: 74 degrees
T Axis: 15 degrees
Ventricular Rate: 68 {beats}/min

## 2014-12-13 LAB — TYPE AND SCREEN
ABO/Rh: A NEG
Antibody Screen: NEGATIVE

## 2014-12-13 LAB — POC PREGNANCY UR-QUAL: Pregnancy, Urine: NEGATIVE

## 2014-12-13 MED ORDER — DIPHENHYDRAMINE HCL 50 MG/ML IJ SOLN
50 MG/ML | Freq: Once | INTRAMUSCULAR | Status: DC | PRN
Start: 2014-12-13 — End: 2014-12-13

## 2014-12-13 MED ORDER — HYDROMORPHONE HCL 1 MG/ML IJ SOLN
1 MG/ML | INTRAMUSCULAR | Status: DC | PRN
Start: 2014-12-13 — End: 2014-12-15

## 2014-12-13 MED ORDER — METOCLOPRAMIDE HCL 5 MG/ML IJ SOLN
5 MG/ML | Freq: Once | INTRAMUSCULAR | Status: DC
Start: 2014-12-13 — End: 2014-12-13

## 2014-12-13 MED ORDER — LACTATED RINGERS IV SOLN
INTRAVENOUS | Status: DC
Start: 2014-12-13 — End: 2014-12-13
  Administered 2014-12-13: 15:00:00 via INTRAVENOUS

## 2014-12-13 MED ORDER — NORMAL SALINE FLUSH 0.9 % IV SOLN
0.9 % | INTRAVENOUS | Status: DC | PRN
Start: 2014-12-13 — End: 2014-12-13

## 2014-12-13 MED ORDER — OXYCODONE-ACETAMINOPHEN 5-325 MG PO TABS
5-325 MG | Freq: Once | ORAL | Status: DC | PRN
Start: 2014-12-13 — End: 2014-12-13

## 2014-12-13 MED ORDER — HYDROMORPHONE HCL 1 MG/ML IJ SOLN
1 MG/ML | INTRAMUSCULAR | Status: DC | PRN
Start: 2014-12-13 — End: 2014-12-13

## 2014-12-13 MED ORDER — MIDAZOLAM HCL 2 MG/2ML IJ SOLN
2 MG/ML | Freq: Once | INTRAMUSCULAR | Status: DC | PRN
Start: 2014-12-13 — End: 2014-12-13

## 2014-12-13 MED ORDER — HYDRALAZINE HCL 20 MG/ML IJ SOLN
20 MG/ML | INTRAMUSCULAR | Status: DC | PRN
Start: 2014-12-13 — End: 2014-12-13

## 2014-12-13 MED ORDER — MEPERIDINE HCL 25 MG/ML IJ SOLN
25 MG/ML | INTRAMUSCULAR | Status: DC | PRN
Start: 2014-12-13 — End: 2014-12-13

## 2014-12-13 MED ORDER — METOCLOPRAMIDE HCL 5 MG/ML IJ SOLN
5 MG/ML | Freq: Once | INTRAMUSCULAR | Status: AC | PRN
Start: 2014-12-13 — End: 2014-12-13
  Administered 2014-12-13: 18:00:00 10 mg via INTRAVENOUS

## 2014-12-13 MED ORDER — GLYCOPYRROLATE 0.2 MG/ML IJ SOLN
0.2 MG/ML | Freq: Once | INTRAMUSCULAR | Status: DC
Start: 2014-12-13 — End: 2014-12-13

## 2014-12-13 MED ORDER — LABETALOL HCL 5 MG/ML IV SOLN
5 MG/ML | INTRAVENOUS | Status: DC | PRN
Start: 2014-12-13 — End: 2014-12-13

## 2014-12-13 MED ORDER — MORPHINE SULFATE (PF) 2 MG/ML IV SOLN
2 MG/ML | INTRAVENOUS | Status: DC | PRN
Start: 2014-12-13 — End: 2014-12-13

## 2014-12-13 MED ORDER — NORMAL SALINE FLUSH 0.9 % IV SOLN
0.9 % | Freq: Two times a day (BID) | INTRAVENOUS | Status: DC
Start: 2014-12-13 — End: 2014-12-13

## 2014-12-13 MED ORDER — SCOPOLAMINE 1 MG/3DAYS TD PT72
1 MG/3DAYS | TRANSDERMAL | Status: DC
Start: 2014-12-13 — End: 2014-12-15
  Administered 2014-12-13: 18:00:00 1 via TRANSDERMAL

## 2014-12-13 MED ORDER — LIDOCAINE HCL (PF) 1 % IJ SOLN
1 % | Freq: Once | INTRAMUSCULAR | Status: DC | PRN
Start: 2014-12-13 — End: 2014-12-13

## 2014-12-13 MED ORDER — FAMOTIDINE 20 MG/2ML IV SOLN
20 MG/2ML | Freq: Once | INTRAVENOUS | Status: DC
Start: 2014-12-13 — End: 2014-12-13

## 2014-12-13 MED ORDER — HYOSCYAMINE SULFATE 0.125 MG SL SUBL
125 MCG | SUBLINGUAL | Status: DC | PRN
Start: 2014-12-13 — End: 2014-12-15
  Administered 2014-12-14 (×2): 125 ug via SUBLINGUAL

## 2014-12-13 MED ORDER — MORPHINE SULFATE (PF) 4 MG/ML IV SOLN
4 MG/ML | INTRAVENOUS | Status: DC | PRN
Start: 2014-12-13 — End: 2014-12-13
  Administered 2014-12-13: 18:00:00 4 mg via INTRAVENOUS

## 2014-12-13 MED ORDER — ACETAMINOPHEN 325 MG PO TABS
325 MG | ORAL | Status: DC | PRN
Start: 2014-12-13 — End: 2014-12-13

## 2014-12-13 MED ORDER — DEXTROSE 5 % IV SOLN (MINI-BAG)
5 % | Freq: Once | INTRAVENOUS | Status: DC
Start: 2014-12-13 — End: 2014-12-13

## 2014-12-13 MED ORDER — LACTATED RINGERS IV SOLN
INTRAVENOUS | Status: DC
Start: 2014-12-13 — End: 2014-12-15
  Administered 2014-12-14 (×2): via INTRAVENOUS

## 2014-12-13 MED ORDER — PANTOPRAZOLE SODIUM 40 MG IV SOLR
40 MG | Freq: Every day | INTRAVENOUS | Status: DC
Start: 2014-12-13 — End: 2014-12-15
  Administered 2014-12-13 – 2014-12-15 (×3): 40 mg via INTRAVENOUS

## 2014-12-13 MED ORDER — BUPIVACAINE-EPINEPHRINE (PF) 0.5% -1:200000 IJ SOLN
INTRAMUSCULAR | Status: AC
Start: 2014-12-13 — End: ?

## 2014-12-13 MED ORDER — SODIUM CHLORIDE 0.9 % IJ SOLN
0.9 % | Freq: Every day | INTRAMUSCULAR | Status: DC
Start: 2014-12-13 — End: 2014-12-15
  Administered 2014-12-15: 13:00:00 10 mL via INTRAVENOUS

## 2014-12-13 MED ORDER — ALPRAZOLAM 0.25 MG PO TABS
0.25 MG | Freq: Every evening | ORAL | Status: DC | PRN
Start: 2014-12-13 — End: 2014-12-15

## 2014-12-13 MED ORDER — ONDANSETRON HCL 4 MG/2ML IJ SOLN
4 MG/2ML | Freq: Four times a day (QID) | INTRAMUSCULAR | Status: DC | PRN
Start: 2014-12-13 — End: 2014-12-15
  Administered 2014-12-13 – 2014-12-15 (×5): 4 mg via INTRAVENOUS

## 2014-12-13 MED ORDER — HYOSCYAMINE SULFATE 0.125 MG PO TABS
125 MCG | ORAL | Status: DC | PRN
Start: 2014-12-13 — End: 2014-12-13

## 2014-12-13 MED ORDER — FENTANYL CITRATE 0.05 MG/ML IJ SOLN
0.05 MG/ML | INTRAMUSCULAR | Status: DC | PRN
Start: 2014-12-13 — End: 2014-12-13
  Administered 2014-12-13 (×2): 25 ug via INTRAVENOUS

## 2014-12-13 MED ORDER — HYDROMORPHONE HCL 1 MG/ML IJ SOLN
1 MG/ML | INTRAMUSCULAR | Status: DC | PRN
Start: 2014-12-13 — End: 2014-12-15
  Administered 2014-12-13 – 2014-12-14 (×3): 0.5 mg via INTRAVENOUS

## 2014-12-13 MED ORDER — NORMAL SALINE FLUSH 0.9 % IV SOLN
0.9 % | Freq: Two times a day (BID) | INTRAVENOUS | Status: DC
Start: 2014-12-13 — End: 2014-12-15
  Administered 2014-12-14: 12:00:00 10 mL via INTRAVENOUS

## 2014-12-13 MED ORDER — NORMAL SALINE FLUSH 0.9 % IV SOLN
0.9 % | INTRAVENOUS | Status: DC | PRN
Start: 2014-12-13 — End: 2014-12-15

## 2014-12-13 MED FILL — PROTONIX 40 MG IV SOLR: 40 MG | INTRAVENOUS | Qty: 40

## 2014-12-13 MED FILL — FENTANYL CITRATE 0.05 MG/ML IJ SOLN: 0.05 MG/ML | INTRAMUSCULAR | Qty: 2

## 2014-12-13 MED FILL — HYDROMORPHONE HCL 1 MG/ML IJ SOLN: 1 MG/ML | INTRAMUSCULAR | Qty: 1

## 2014-12-13 MED FILL — LEVSIN 0.125 MG PO TABS: 0.125 MG | ORAL | Qty: 1

## 2014-12-13 MED FILL — ONDANSETRON HCL 4 MG/2ML IJ SOLN: 4 MG/2ML | INTRAMUSCULAR | Qty: 2

## 2014-12-13 MED FILL — MORPHINE SULFATE (PF) 4 MG/ML IV SOLN: 4 MG/ML | INTRAVENOUS | Qty: 1

## 2014-12-13 MED FILL — GLYCOPYRROLATE 0.2 MG/ML IJ SOLN: 0.2 MG/ML | INTRAMUSCULAR | Qty: 1

## 2014-12-13 MED FILL — FAMOTIDINE 20 MG/2ML IV SOLN: 20 MG/2ML | INTRAVENOUS | Qty: 2

## 2014-12-13 MED FILL — CEFTRIAXONE SODIUM 2 G IJ SOLR: 2 g | INTRAMUSCULAR | Qty: 2

## 2014-12-13 MED FILL — METOCLOPRAMIDE HCL 5 MG/ML IJ SOLN: 5 MG/ML | INTRAMUSCULAR | Qty: 2

## 2014-12-13 MED FILL — SENSORCAINE-MPF/EPINEPHRINE 0.5% -1:200000 IJ SOLN: INTRAMUSCULAR | Qty: 30

## 2014-12-13 NOTE — Progress Notes (Signed)
Rocephin  2 gr iv to or

## 2014-12-13 NOTE — Op Note (Signed)
Operative Note    Patient: Tami Nichols  DOB: 1966/06/22    Surgery date:  12-13-14     [facility]     PREOPERATIVE DIAGNOSIS:  Morbid obesity  POSTOPERATIVE DIAGNOSIS:  Morbid obesity   BMI:   43.68  PROCEDURE PERFORMED: Sleeve gastrectomy  (laparoscopic) 36 French     Surgeon:   Joneen Boersrace W.  Jamerson Vonbargen, MD  Assistant: Dr. Jarvis NewcomerNathan Roberts    Anesthesia: GETA  Estimated Blood Loss: 10 cc's  Complications: None      Indications for procedure:  This is an obese patient who came to see me in the office in consultation for weight loss surgery.  This patient is a good candidate for the procedure and has been thoroughly educated on the risks and benefits of the procedure including the possibility of bleeding, infection, and even death.  The patient has been extensively educated on post operative dietary requirements and the lifestyle changes needed for a successful outcome.  The patient has made a commitment to make the necessary dietary and lifestyle changes for a successful outcome.  Risks, benefits, and alternatives were discsussed and the patient expresses full understanding and desires to proceed.   Procedure Details:  The patient was taken to the operating room and placed on the table in the supine position.  General endotracheal anesthesia was administered and the abdomen was prepped and draped in the usual sterile fashion.  Compression boots were in place.  An incision was made above the umbilicus and access was gained using an Optiview non-bladed trocar.  The abdomen was insufflated with carbon dioxide to 18 mm of mercury.  The remaining trocars were inserted in standard position under direct visualization without complication.  Adhesions were taken down as needed to facilitate exposure.   The patient was placed in slight reverse Trendelenburg position and a liver retractor was placed.     The peritoneum was incised at the Angle of His for mark the cephalad aspect of our dissection. The distal  aspect of dissection was started about 5cm proximal to the pylorus by dividing the attachments of the greater omentum to the greature curvature of the stomach with the harmonic scalpel.  We proceeded with this dissection all the way up to the angle of His until we had the entire greater curve freed up.  We also freed up all posterior attachments all the way over to the lesser curve.    The gastrectomy was initiated 6cm from the pylorus firing transversely using the Ethicon 60mm linear cutter.  Seamguard was used for staple line reinforcement.     After that firing we passed a  Bougie down into the initiated sleeve and pulled it up snugly against the lesser curve.  We then followed the Bougie with repeated firings of the 60mm stapler until we completed the gastrectomy entirely.    The specimen was removed through a 15mm trocar and sent to pathology.    We inspected the staple lines and they all looked good and there was no bleeding.      We achieved meticulous hemostasis, withdrew the trocars, and closed the incisions with 4-0 Monocryl. Benzoin, steri strips, and sterile dressings were applied.  The patient was then awakened, extubated, and taken to the recovery room in stable condition.  There were no complications to this procedure and at the end all counts were correct.              SIGNED  Delos Haringrace Naja Apperson MD  Dictated: 12-13-14           11:18    cc:  Dr.  Maree ErieSrilakshmi Murthy, [address], [place], [phone]

## 2014-12-13 NOTE — H&P (Signed)
[employer]    [id number]    [facility] Same Day Surgery Update H & P  Department of General Surgery   Surgical Service   CNP Pre-operative History and Physical  Last H & P within the last 30 days.    DIAGNOSIS:   E66.01 MORBID OBESTIY, K45.8 HIAT    PROCEDURE:  Laparoscopic Sleeve Gastrectomy With Possible Hernia Repair      HISTORY OF PRESENT ILLNESS: Pt. Is a 48 y.o morbidly obese. Female who has been unsuccessful in losing weight with conservative treatment. Pt. Is now her for surgical intervention.     Please see initial H & P     Past Medical History:        Diagnosis Date   ??? Anxiety      Past Surgical History:        Procedure Laterality Date   ??? Breast surgery       augmentation     Past Social History:  History     Social History   ??? Marital Status: Married     Spouse Name: N/A     Number of Children: N/A   ??? Years of Education: N/A     Social History Main Topics   ??? Smoking status: Never Smoker    ??? Smokeless tobacco: None   ??? Alcohol Use: No   ??? Drug Use: None   ??? Sexual Activity: None     Other Topics Concern   ??? None     Social History Narrative         Medications Prior to Admission:      Prior to Admission medications    Medication Sig Start Date End Date Taking? Authorizing Provider   ALPRAZolam (XANAX) 0.25 MG tablet Take 0.25 mg by mouth nightly as needed for Sleep   Yes Historical Provider, MD   naproxen (NAPROSYN) 500 MG tablet Take 500 mg by mouth 2 times daily (with meals)   Yes Historical Provider, MD   cetirizine (ZYRTEC) 10 MG tablet Take 10 mg by mouth daily   Yes Historical Provider, MD   ZOLMitriptan (ZOMIG) 5 MG tablet Take 5 mg by mouth as needed for Migraine    Historical Provider, MD   naltrexone-bupropion HCl ER (CONTRAVE) 8-90 MG per tablet Take 2 tablets by mouth 2 times daily    Historical Provider, MD         Allergies:  Review of patient's allergies indicates no known allergies.    PHYSICAL EXAM:      BP 175/94 mmHg   Pulse 76   Temp(Src) 97.4 ??F (36.3 ??C)   Resp 18   Ht  5\' 9"  (1.753 m)   Wt 299 lb (135.626 kg)   BMI 44.13 kg/m2   SpO2 100%   LMP 11/23/2014     Heart:  regular rate and rhythm,no murmur     Lungs:  No increased work of breathing, good air exchange, clear to auscultation bilaterally, no crackles or wheezing    Abdomen:  soft, non-distended, non-tender, no rebound tenderness or guarding, normal active bowel sounds and no masses palpated    ASSESSMENT AND PLAN:    1.  Patient seen and focused exam done today- no new changes since last physical exam on 11/15/14    2.  Access to ancillary services are available per request of the provider.    Marijean HeathKathleen Sotero Brinkmeyer, CNP     12/13/2014

## 2014-12-13 NOTE — Progress Notes (Signed)
PACU Transfer Note    Filed Vitals:    12/13/14 1315   BP: 152/91   Pulse: 64   Temp:    Resp: 21   SpO2: 95%       In: 1300 [I.V.:1300]  Out: 10     Pain assessment:  VS stable. [facility] at bedside. Skin check completed. Patient to transfer to room #5304-02 via bed as scheduled. Family updated.  Pain Level: 7 (abdomen ache)    Report given to Receiving unit RN.    12/13/2014 1:39 PM

## 2014-12-13 NOTE — Progress Notes (Signed)
[facility]  Patient resting in bed with eyes closed appears sleeping with VSS, moving freely in bed.

## 2014-12-13 NOTE — Anesthesia Post-Procedure Evaluation (Signed)
Anesthesia Post-op Note    Name: Tami Nichols   DOB: 08/02/1966    MRN: [id number]   Procedure(s) Performed: Sleeve gastrectomy  (laparoscopic) 3036 French        Date of Surgery: 12/13/14  Anesthesia type: General      POST-OP ASSESSMENT::    ?? Anesthetic problems: No  ?? Vitals (most recent):  height is 5\' 9"  (1.753 m) and weight is 299 lb (135.626 kg). Her temporal temperature is 97.6 ??F (36.4 ??C). Her blood pressure is 152/91 and her pulse is 64. Her respiration is 21 and oxygen saturation is 95%.   ?? Cardiovascular system stable: Yes  ?? Respiratory function:  airway patent: Yes  ventilator and/or ETT: No  ?? Hydration adequate: Yes    ?? Nausea: No  ?? Level of consciousness: awake, alert and oriented  ?? Post-op pain control: Adequate  ?? Other:    assessment completed and signed @:   Dola ArgyleJames R. Effie Shyoleman, MD  December 13, 2014 2:01 PM

## 2014-12-13 NOTE — Discharge Summary (Signed)
Physician Discharge Summary     Patient ID:  Tami Nichols  [id number]  48 y.o.  02/19/1966    Admit date: 12/13/2014    Discharge date and time: 12/15/14    Admitting Physician: Joneen Boersrace W Curry, MD     Discharge Physician: Joneen Boersrace W Curry, MD    Admission Diagnoses: E66.01 MORBID OBESTIY, K45.8 HIAT    Discharge Diagnoses: Morbid Obesity    Admission Condition: fair    Discharged Condition: stable    Indication for Admission: Surgery:  Laparoscopic Sleeve Gastrectomy, IV hydration, monitoring, pain and nausea management    Hospital Course: 48 y.o. female admitted with morbid obesity who underwent laparoscopic sleeve gastrectomy.  Surgery was uneventful and she was admitted to bariatric post-operative surgical floor in stable condition for IV hydration, monitoring and pain and nausea management.  The following morning the pain was tolerable on po pain medication but she was having nausea with po intake.  She stayed overnight for continued nausea and pain control.  She was discharged in stable condition on POD2.      Treatments: IV hydration, IV pain control, IV nausea control        Disposition: home    Patient Instructions:   @MEDDISCHARGE @  Activity: activity as tolerated and no driving while on analgesics  Diet: clear liquids  Wound Care: as directed    Follow-up with Dr. Clementeen Grahamurry in 1-2 weeks as scheduled      Signed:  Rufina FalcoDianne Elizabeth Nichols  12/13/2014  2:43 PM

## 2014-12-13 NOTE — Progress Notes (Signed)
Patient admitted to PACU # 15 from OR post Lap Gastrectomy per Dr. Clementeen Grahamurry.  Attached to PACU monitoring system and report received from CRNA.  Patient was hemodynamically stable during surgical procedure.  Patient arrived in PACU very drowsy and with no evidence of pain.

## 2014-12-13 NOTE — Progress Notes (Addendum)
Department of Surgery  Post Op Note    Objective:Resting in bed.  Complaining of "gas pains".  Denies any nausea or vomiting.  Denies CP or SOF  Anesthesia type: General      I/O    Intra op    Post op     Fluids    1100  ml    300  ml     EBL       10  ml      n/a     Urine       n/a      n/a       Exam: VITALS:  BP 146/75 mmHg   Pulse 68   Temp(Src) 97.8 ??F (36.6 ??C) (Oral)   Resp 18   Ht 5\' 9"  (1.753 m)   Wt 299 lb (135.626 kg)   BMI 44.13 kg/m2   SpO2 97%   LMP 11/23/2014  Post-op vital signs:  Stable     Exam:General appearance: alert, appears stated age and cooperative  Lungs: clear to auscultation bilaterally  Heart: regular rate and rhythm, S1, S2 normal, no murmur, click, rub or gallop  Abdomen: soft, appropriately tender, 5 trocar sites with gauze and medipore dressing.  Minimal staining on dressings 1, 2 and 4.  No active signs of bleeding present.  Hypoactive bowel sounds      Assessment and Plan  Pt is a 48 year old female s/p laparoscopic sleeve gastrectomy POD #0    Pain management: continue with dilaudid  FeNa:  Diet - bariatric clears , Fluids LR @ 125 ml/hour   GU:  Patient has not urinated.  Will continue to monitor closely  Ambulation: OOB to chair  Respiratory:  IS at bedside, encourage hourly IS and deep breathing  Prophylaxis: AC boots    Stable post op check    D. [place] RidingCahill NP-C  [phone]  Resident Support Staff

## 2014-12-13 NOTE — Plan of Care (Signed)
Problem: Pain:  Goal: Pain level will decrease  Pain level will decrease   Outcome: Ongoing  Complaining of "gas" pain rated 9 on a 0-10 scale.  Instructed patient on turning and repositioning and ambulating.  Medicated with Dilaudid 0.5 mg IV x 1 at this time.

## 2014-12-13 NOTE — Progress Notes (Signed)
[facility] PACU Education and Care Plan Goals  The following items will be achieved upon completion of the patient's transfer or discharge from the PACU:    Post Operative Pain Management                                                                               _0  Patient will verbalize understanding of pain scale and pain management.  _1  Patient achieves predetermined pain goal of 4.  _2  Self reports a comfort level acceptable for discharge to in patient floor.  _3  Other     Fall Risk Potential  _4  Due to Perioperative medication administration  Additional Risk Identified:   _5  Sensory deficit         _6  Motor deficit         _7  Balance problem         _8  Home medication         _9  Uses assistive device to ambulate    Goal(s) for fall prevention:  _10  Prevent fall or injury by calling for assistance with activity and use of siderails while hospitalized  _11  Prevent fall or injury by using assistance with activity after discharge.  _12  Patient / Significant other verbalize understanding in use of any ordered assistive devices    Mobility Safety/ ADL  _13  Reach a functional mobility goal within limitations of the procedure.    Infection Precautions                                                                                                            _14 Patient understands implementation of infection precautions (see [facility] Presurgical Instructions and SSI Prevention Handout)    Post operative Assessment and Care                                                             _15  Standards of care met as delineated by ASPAN.                                                              Discharge Education and Goals  _16 Patient voices understanding of PACU discharge criteria  _17 Outpatient / significant other voices understanding of home care and follow up procedures (See [facility] Procedure Discharge Instructions)  _18  Patient / significant other understanding of Special Needs:  _19  Cooling  device  _20   Wound Support Device  _0  Crutches   _1 Drain    _2  Walker   _3 Other:  Ice pack to abdomen/bilateral BK SCD's on   _4  Inpatient / significant other understands the plan for transfer to the inpatient unit

## 2014-12-13 NOTE — Brief Op Note (Signed)
Brief Postoperative Note    Tami SkyeAmber Nichols  Date of Birth:  08/03/1966  [id number]    Pre-operative Diagnosis: morbid obesity    Post-operative Diagnosis: Same    Procedure:  Laparoscopic sleeve gastrectomy     Anesthesia: General    Surgeons/Assistants: curry, Raiden Haydu     Estimated Blood Loss: less than 50     Complications: None    Specimens: Was Obtained: partial gastrectomy       Electronically signed by Jarvis NewcomerNathan Anu Stagner, MD on 12/13/2014 at 11:21 AM

## 2014-12-14 ENCOUNTER — Inpatient Hospital Stay
Admit: 2014-12-14 | Discharge: 2014-12-15 | Disposition: A | Discharge status: Home / Self Care | Source: Ambulatory Visit | Attending: Surgery | Admitting: Surgery

## 2014-12-14 MED ORDER — HYDROCODONE-ACETAMINOPHEN 5-325 MG PO TABS
5-325 MG | ORAL | Status: DC | PRN
Start: 2014-12-14 — End: 2014-12-13

## 2014-12-14 MED ORDER — OXYCODONE-ACETAMINOPHEN 5-325 MG PO TABS
5-325 MG | ORAL | Status: DC | PRN
Start: 2014-12-14 — End: 2014-12-15

## 2014-12-14 MED ORDER — OXYCODONE-ACETAMINOPHEN 5-325 MG PO TABS
5-325 MG | ORAL | Status: DC | PRN
Start: 2014-12-14 — End: 2014-12-15
  Administered 2014-12-14 – 2014-12-15 (×5): 1 via ORAL

## 2014-12-14 MED ORDER — PROCHLORPERAZINE EDISYLATE 5 MG/ML IJ SOLN
5 MG/ML | Freq: Once | INTRAMUSCULAR | Status: AC
Start: 2014-12-14 — End: 2014-12-14
  Administered 2014-12-14: 13:00:00 10 mg via INTRAVENOUS

## 2014-12-14 MED ORDER — PROCHLORPERAZINE EDISYLATE 5 MG/ML IJ SOLN
5 MG/ML | Freq: Four times a day (QID) | INTRAMUSCULAR | Status: DC | PRN
Start: 2014-12-14 — End: 2014-12-15
  Administered 2014-12-14: 20:00:00 5 mg via INTRAVENOUS

## 2014-12-14 MED ORDER — ENOXAPARIN SODIUM 40 MG/0.4ML SC SOLN
40 MG/0.4ML | Freq: Every day | SUBCUTANEOUS | Status: DC
Start: 2014-12-14 — End: 2014-12-15
  Administered 2014-12-15 (×2): 40 mg via SUBCUTANEOUS

## 2014-12-14 MED FILL — MIDAZOLAM HCL 2 MG/2ML IJ SOLN: 2 MG/ML | INTRAMUSCULAR | Qty: 2

## 2014-12-14 MED FILL — SYMAX-SL 0.125 MG SL SUBL: 0.125 MG | SUBLINGUAL | Qty: 1

## 2014-12-14 MED FILL — GLYCOPYRROLATE 0.4 MG/2ML IJ SOLN: 0.4 MG/2ML | INTRAMUSCULAR | Qty: 2

## 2014-12-14 MED FILL — PROCHLORPERAZINE EDISYLATE 5 MG/ML IJ SOLN: 5 MG/ML | INTRAMUSCULAR | Qty: 2

## 2014-12-14 MED FILL — LIDOCAINE HCL (CARDIAC) 20 MG/ML IV SOLN: 20 MG/ML | INTRAVENOUS | Qty: 5

## 2014-12-14 MED FILL — HYDROMORPHONE HCL 1 MG/ML IJ SOLN: 1 MG/ML | INTRAMUSCULAR | Qty: 1

## 2014-12-14 MED FILL — ONDANSETRON HCL 4 MG/2ML IJ SOLN: 4 MG/2ML | INTRAMUSCULAR | Qty: 2

## 2014-12-14 MED FILL — LOVENOX 40 MG/0.4ML SC SOLN: 40 MG/0.4ML | SUBCUTANEOUS | Qty: 0.4

## 2014-12-14 MED FILL — PROTONIX 40 MG IV SOLR: 40 MG | INTRAVENOUS | Qty: 40

## 2014-12-14 MED FILL — PROPOFOL 10 MG/ML IV EMUL: 10 MG/ML | INTRAVENOUS | Qty: 40

## 2014-12-14 MED FILL — ZEMURON 50 MG/5ML IV SOLN: 50 MG/5ML | INTRAVENOUS | Qty: 5

## 2014-12-14 MED FILL — KETOROLAC TROMETHAMINE 60 MG/2ML IJ SOLN: 60 MG/2ML | INTRAMUSCULAR | Qty: 2

## 2014-12-14 MED FILL — FENTANYL CITRATE 0.05 MG/ML IJ SOLN: 0.05 MG/ML | INTRAMUSCULAR | Qty: 2

## 2014-12-14 MED FILL — PERCOCET 5-325 MG PO TABS: 5-325 MG | ORAL | Qty: 1

## 2014-12-14 NOTE — Progress Notes (Signed)
Nausea has improved and pain now  "4".  Resting in chair with eyes closed. Encouraged ambulation.

## 2014-12-14 NOTE — Progress Notes (Signed)
Surgery Daily Progress Note  Karys Totzke  Subjective:Continues to c/o abdominal pain.  Tolerating sips      Objective   Infusions:  ??? lactated ringers 125 mL/hr at 12/13/14 1440        I/O:I/O last 3 completed shifts:  In: 3268 [I.V.:3268]  Out: 1735 [Urine:1725; Blood:10]           Wt Readings from Last 1 Encounters:   12/13/14 299 lb (135.626 kg)                 LABS:  No results for input(s): WBC, HGB, HCT, MCV, PLT in the last 72 hours.   No results for input(s): NA, K, CL, CO2, PHOS, BUN, CREATININE in the last 72 hours.    Invalid input(s): CA   No results for input(s): AST, ALT, ALB, BILIDIR, BILITOT, ALKPHOS in the last 72 hours.   No results for input(s): LIPASE, AMYLASE in the last 72 hours.   No results for input(s): PROT, INR, APTT in the last 72 hours.   No results for input(s): CKTOTAL, CKMB, CKMBINDEX, TROPONINI in the last 72 hours.            Exam:BP 145/73 mmHg   Pulse 80   Temp(Src) 98.9 ??F (37.2 ??C) (Oral)   Resp 16   Ht 5\' 9"  (1.753 m)   Wt 299 lb (135.626 kg)   BMI 44.13 kg/m2   SpO2 98%   LMP 11/23/2014  General appearance: alert, appears stated age and cooperative  Lungs: clear to auscultation bilaterally  Heart: regular rate and rhythm, S1, S2   Abdomen: soft, appropriately-tender; bowel sounds present. Incisions c/d/i      ASSESSMENT/PLAN: Pt. is a 48 y.o. female s/p laparoscopic sleeve gastrectomy POD #1  Continue IVF  Continue to encourage sips of bariatric clears  Continue ambulation  Will follow progress this am, if patient tolerates diet, ambulation with pain controlled, will anticipate discharge home this afternoon       Rufina FalcoDianne Elizabeth Taimur Fier 12/14/2014 7:07 AM  [phone]

## 2014-12-14 NOTE — Progress Notes (Addendum)
Nausea without emesis noted.  Belching as well.  No flatus at this time. Abdomen soft and old drainage on incision sites, no new drainage.  Encouraged ambulating frequently and IS every hour 10 times.  Ambulated to chair and gait steady.  Medicated with zofran for nausea.  Patient did have small amount of clear emesis after ambulating to chair.  Also had some urinary incontinence when vomiting.  [facility]ee Cahill aware and new order received.

## 2014-12-14 NOTE — Other (Addendum)
Patient Acct Nbr:  [id number]  Primary AUTH/CERT:    Primary Insurance Company Name:   [employer]/HMO  Primary Insurance Plan Name:  [employer]/SELECT PLUS/CHOICE  Primary Insurance Group Number:  [id number]  Primary Insurance Plan Type: N  Primary Insurance Policy Number:  [id number]

## 2014-12-14 NOTE — Plan of Care (Signed)
Problem: Pain:  Goal: Control of acute pain  Control of acute pain   Outcome: Ongoing  Pt reported pain at 4/10. Pain medication given per MAR. Will continue to monitor.

## 2014-12-14 NOTE — Progress Notes (Signed)
[facility] Cahill APRN aware of nausea with small amount of clear emesis.  Stated she drank two 30 ml cups within 30 minutes.  Medicated with compazine.

## 2014-12-14 NOTE — Progress Notes (Signed)
Pt asked for pain medication earlier this shift and reported her pain at a 4/10. [facility]. Pt then walked the halls and showered. She asked for the second tablet more than an hour after the first to "help her sleep".  Lilli FewSara Ziegler, RN.

## 2014-12-14 NOTE — Progress Notes (Signed)
Walked around half unit and then to chair.  States nausea has improved.

## 2014-12-14 NOTE — Progress Notes (Signed)
Patient has been asleep since pain medications.  Having to remind patient to drink and she is not drinking on her own.  States that she is afraid to drink due to becoming nauseated but is not nauseated at this time.  Encouraged patient to drink every hour and use IS.

## 2014-12-14 NOTE — Progress Notes (Signed)
Pt up and walking in halls with daughter. Tolerated well and encouraged to walk as much as possible.   Lilli FewSara Ziegler, RN.

## 2014-12-14 NOTE — Plan of Care (Signed)
Problem: Pain:  Goal: Pain level will decrease  Pain level will decrease   Outcome: Ongoing  Pain controlled at this time.

## 2014-12-15 MED FILL — PERCOCET 5-325 MG PO TABS: 5-325 MG | ORAL | Qty: 1

## 2014-12-15 MED FILL — ONDANSETRON HCL 4 MG/2ML IJ SOLN: 4 MG/2ML | INTRAMUSCULAR | Qty: 2

## 2014-12-15 MED FILL — LOVENOX 40 MG/0.4ML SC SOLN: 40 MG/0.4ML | SUBCUTANEOUS | Qty: 0.4

## 2014-12-15 MED FILL — PROTONIX 40 MG IV SOLR: 40 MG | INTRAVENOUS | Qty: 40

## 2014-12-15 NOTE — Progress Notes (Addendum)
Surgery Daily Progress Note  Patient: Tami Nichols    Subjective:  No acute events overnight.    Objective   Infusions:  ??? lactated ringers 125 mL/hr at 12/14/14 2254        I/O:I/O last 3 completed shifts:  In: 3902.9 [P.O.:60; I.V.:3842.9]  Out: 4500 [Urine:4500]           Wt Readings from Last 1 Encounters:   12/13/14 299 lb (135.626 kg)                 LABS:  No results for input(s): WBC, HGB, HCT, MCV, PLT in the last 72 hours.   No results for input(s): NA, K, CL, CO2, PHOS, BUN, CREATININE in the last 72 hours.    Invalid input(s): CA   No results for input(s): AST, ALT, ALB, BILIDIR, BILITOT, ALKPHOS in the last 72 hours.   No results for input(s): LIPASE, AMYLASE in the last 72 hours.   No results for input(s): PROT, INR, APTT in the last 72 hours.   No results for input(s): CKTOTAL, CKMB, CKMBINDEX, TROPONINI in the last 72 hours.      Exam:BP 145/78 mmHg   Pulse 74   Temp(Src) 98 ??F (36.7 ??C) (Oral)   Resp 16   Ht 5\' 9"  (1.753 m)   Wt 299 lb (135.626 kg)   BMI 44.13 kg/m2   SpO2 97%   LMP 11/23/2014  General appearance: alert, appears stated age and cooperative  Lungs: clear to auscultation bilaterally  Heart: regular rate and rhythm, S1, S2 normal, no murmur, click, rub or gallop  Abdomen: soft, appropriately tender, incisions c/d/i      ASSESSMENT/PLAN: Pt. is a 48 y.o. female s/p lap sleeve gastrectomy POD2    - doing well  -continue bariatric clears  -DC today       Lafayette DragonAndrew Jairen Goldfarb, MD  12/15/2014 6:44 AM  Pager [phone]

## 2014-12-15 NOTE — Progress Notes (Signed)
Pt given discharge instructions and explained what to look for. Pt  Verbalized understanding. Pt left via wheelchair with husband.

## 2014-12-15 NOTE — Progress Notes (Signed)
Doing well no issues currently.    No nausea or vomiting.    Abdomen soft, NTND    Wounds look good.    Probable DC today.    Delos Haringrace Letica Giaimo MD

## 2015-01-14 NOTE — Unmapped (Signed)
Pt c/o right knee pain. Sherry Compton states that she was prescribed a medication previously for the same issue. She has to work this evening, and would like a refill sent to her local pharmacy.   Spoke with Dr. Amedeo Gory received verbal Sherry Compton to phone in Naproxen 500 mg #30no refills. Pt has been made aware.

## 2015-02-07 ENCOUNTER — Other Ambulatory Visit: Admit: 2015-02-07 | Payer: PRIVATE HEALTH INSURANCE

## 2015-02-07 ENCOUNTER — Ambulatory Visit: Admit: 2015-02-07 | Payer: PRIVATE HEALTH INSURANCE

## 2015-02-07 DIAGNOSIS — B351 Tinea unguium: Secondary | ICD-10-CM

## 2015-02-07 LAB — FUNGAL CULTURE, DERMATOPHYTE WITH KOH: KOH Prep: NONE SEEN

## 2015-02-07 MED ORDER — naproxen (NAPROSYN) 500 MG tablet
500 | ORAL_TABLET | Freq: Two times a day (BID) | ORAL | Status: AC
Start: 2015-02-07 — End: 2015-03-10

## 2015-02-07 MED ORDER — ALPRAZolam (XANAX) 0.25 MG tablet
0.25 | ORAL_TABLET | ORAL | Status: AC
Start: 2015-02-07 — End: 2015-05-03

## 2015-02-07 MED ORDER — valACYclovir (VALTREX) 1000 MG tablet
1000 | ORAL_TABLET | Freq: Two times a day (BID) | ORAL | Status: AC
Start: 2015-02-07 — End: 2015-06-16

## 2015-02-07 NOTE — Unmapped (Signed)
Alprazolam 0.25 mg  LR- 09/28/14 #30x2  LOV-02/07/15  Lab Results   Component Value Date    LDL 90 10/22/2012    HGBA1C 4.8 10/22/2012    TSH 2.53 10/22/2012    VITD25H 19.0* 10/22/2012

## 2015-02-07 NOTE — Unmapped (Signed)
Subjective  HPI:   Patient ID: Sherry Compton is a 49 y.o. female.    Chief Complaint:  HPI     Chief Complaint   Patient presents with   ??? Nail Problem     fungus. left foot, big toe         Here with left big toe nail discoloration   since 2 mths  Nail bed lifts   she thinks she has fungal infection   no pain  H/o anxiety stable   see below   on xanax prn    Little interest or pleasure in doing things: Not at all  Feeling down, depressed, or hopeless: Not at all  Trouble falling or staying asleep, or sleeping too much: More than half the days  Feeling tired or having little energy: Several days  Feeling bad about yourself - or that you are a failure or have let yourself or your family down: Several days  Trouble concentrating on things, such as reading the newspaper or watching television: Several days  Moving or speaking so slowly that other people could have noticed. Or the opposite - being so fidgety or restless that you have been moving around a lot more than usual: Not at all  Thoughts that you would be better off dead, or of hurting yourself in some way: Not at all  PHQ-9 Total Score: 5  If you checked off any problems, how difficult have these problems made it for you to do your work, take care of things at home, or get along with other people?: Somewhat difficult    Over the last 2 weeks, how often have you been bothered by the following problems?  Feeling nervous, anxious or on edge:: More than half the days  Not being able to stop or control worrying:: Several days  Worrying too much about different things:: More than half the days  Trouble relaxing:: Several days  Being so restless that it is hard to sit still:: Not at all  Becoming easily annoyed or irritable:: Several days  Feeling afraid as if something awful might happen:: Not at all  Total Score: 7  If you checked off any problems, how difficult have these problems made it for you to do your work, take care of things at home, or get along with other  people?: Somewhat difficult   She has h/o back pain would like refill of naproxen   has herpes labialis stable would like rx of valtrex when she has flare up  Past Medical History   Diagnosis Date   ??? Anxiety      Past Surgical History   Procedure Laterality Date   ??? Augmentation breast endoscopic       History     Social History   ??? Marital Status: Married     Spouse Name: N/A     Number of Children: N/A   ??? Years of Education: N/A     Occupational History   ??? Not on file.     Social History Main Topics   ??? Smoking status: Former Smoker   ??? Smokeless tobacco: Not on file   ??? Alcohol Use: No   ??? Drug Use: No   ??? Sexual Activity: Not on file     Other Topics Concern   ??? Caffeine Use Yes   ??? Exercise No   ??? Seat Belt Yes     Social History Narrative     Family History   Problem Relation Age of  Onset   ??? Migraines Sister    ??? Diabetes Paternal Grandmother    ??? Heart disease Paternal Grandmother    ??? Miscarriages / Stillbirths Paternal Grandmother        Medications:  Current Outpatient Prescriptions   Medication Sig Dispense Refill   ??? ALPRAZolam (XANAX) 0.25 MG tablet 1-2 tablets 1/2 hr prior to flight 30 tablet 2   ??? naproxen (NAPROSYN) 500 MG tablet Take 1 tablet (500 mg total) by mouth 2 times a day with meals. 60 tablet 0   ??? valACYclovir (VALTREX) 1000 MG tablet Take 1 tablet (1,000 mg total) by mouth 2 times a day. For 3 days 30 tablet 2     No current facility-administered medications for this visit.        ROS:   Review of Systems   Constitutional: Negative for weight loss, weight gain and fatigue.   Musculoskeletal: Negative for neck pain and neck stiffness.   Neurological: Negative for dizziness, light-headedness and headaches.   Psychiatric/Behavioral: Negative for sleep disturbance and dysphoric mood. The patient is nervous/anxious.           Objective:   Physical Exam   Vitals reviewed.  Constitutional: She appears well-developed and well-nourished.   Cardiovascular: Normal rate, regular rhythm, normal  heart sounds and intact distal pulses.    No murmur heard.  Pulmonary/Chest: Effort normal and breath sounds normal.   Abdominal: Soft.   Psychiatric: She has a normal mood and affect. Her behavior is normal. Judgment and thought content normal.         Left foot nail discolored and loose on nail bed      Filed Vitals:    02/07/15 1046   BP: 112/80   Pulse: 75   Temp: 97.9 ??F (36.6 ??C)   TempSrc: Oral   Weight: 266 lb 12.8 oz (121.02 kg)   SpO2: 98%     Body mass index is 39.38 kg/(m^2).  There is no height on file to calculate BSA.         Chief Complaint   Patient presents with   ??? Nail Problem     fungus. left foot, big toe     Wt Readings from Last 3 Encounters:   02/07/15 266 lb 12.8 oz (121.02 kg)   11/15/14 297 lb 8 oz (134.945 kg)   09/28/14 292 lb (132.45 kg)     Lab Results   Component Value Date    WBC 7.2 10/22/2012    HGB 12.9 10/22/2012    HCT 39.2 10/22/2012    MCV 93.2 10/22/2012    PLT 250 10/22/2012     Lab Results   Component Value Date    HGBA1C 4.8 10/22/2012     Lab Results   Component Value Date    CHOLTOT 164 10/22/2012    TRIG 119 10/22/2012    HDL 50 10/22/2012    LDL 90 10/22/2012     Lab Results   Component Value Date    TSH 2.53 10/22/2012     Lab Results   Component Value Date    VITD25H 19.0* 10/22/2012     No results found for: GLUCOSE, BUN, CO2, CREATININE, K, NA, CL, CALCIUM, ALBUMIN, PROT, ALKPHOS, ALT, AST, BILITOT           Assessment/Plan:     left toenail fungus culture sent    Anxiety stable on prn xanax  Herpes labialis flares rx for valtrex given   pain prn naproxen  F/u in 6 months

## 2015-02-07 NOTE — Unmapped (Signed)
rx printed

## 2015-02-07 NOTE — Unmapped (Signed)
Will send nail for culture

## 2015-03-10 MED ORDER — naproxen (NAPROSYN) 500 MG tablet
500 | ORAL_TABLET | Freq: Two times a day (BID) | ORAL | Status: AC
Start: 2015-03-10 — End: 2015-05-03

## 2015-03-10 NOTE — Unmapped (Signed)
Pt was in office to day with daughter. Received result of no fugal infection found in great toenail.

## 2015-03-10 NOTE — Unmapped (Signed)
Patient requesting refill on Naproxen sent to [facility]

## 2015-03-10 NOTE — Unmapped (Signed)
Pt aware- See previous message

## 2015-03-10 NOTE — Unmapped (Signed)
See previous message,closed it accidentally

## 2015-03-10 NOTE — Unmapped (Signed)
Pt called to request a prescription refill of Naproxen  LOV  02/07/15  LR  #60  x0  02/07/15    Lab Results   Component Value Date    LDL 90 10/22/2012    HGBA1C 4.8 10/22/2012    TSH 2.53 10/22/2012    VITD25H 19.0* 10/22/2012

## 2015-03-10 NOTE — Unmapped (Signed)
Please inform pt her toe nail does not have fungal infection.

## 2015-05-03 MED ORDER — naproxen (NAPROSYN) 500 MG tablet
500 | ORAL_TABLET | Freq: Two times a day (BID) | ORAL | Status: AC
Start: 2015-05-03 — End: 2015-06-16

## 2015-05-03 MED ORDER — ALPRAZolam (XANAX) 0.25 MG tablet
0.25 | ORAL_TABLET | ORAL | Status: AC
Start: 2015-05-03 — End: 2015-06-16

## 2015-05-03 NOTE — Unmapped (Signed)
Pt calling for two refills. Explained the need for an office visit for controlled medication, and to be sure to schedule prior to leaving. However, pt stated that she was just here 02/07/15, and requested to send anyway because she is leaving for vacation.    Alprazolam 0.25 mg  LR- 02/07/15 #30x0    NAPROXEN 500 MG  LR- 03/10/15 #60X0  LOV- 02/07/15  Lab Results   Component Value Date    LDL 90 10/22/2012    HGBA1C 4.8 10/22/2012    TSH 2.53 10/22/2012    VITD25H 19.0* 10/22/2012

## 2015-05-03 NOTE — Unmapped (Signed)
Error, should have bee refill request phone note

## 2015-05-04 NOTE — Unmapped (Signed)
Printed and signed rx for alprazolam faxed to [facility].

## 2015-06-14 NOTE — Unmapped (Signed)
Will call pt tom

## 2015-06-14 NOTE — Unmapped (Signed)
Patient is requesting an appointment for RX Refill. Soonest appt is not until 06/23/15 and patient will be running out her RXs. Patient is requesting RX for Xanax, Naproxen and Valtrex to be refilled.     Patient states that she would also like to discuss if possible for PCP to change RX for Valtrex, states that she has fever blisters and would like to try the medication that she could take everyday. Please advise. Thank you.

## 2015-06-15 NOTE — Unmapped (Signed)
Ok tomorrow [phone]

## 2015-06-15 NOTE — Unmapped (Signed)
Pt states she is going out of town for a wedding on Saturday 6/25 and will not be back until 7/6.  She is requesting to be seen this week if possible. Please advise.  Thank you!

## 2015-06-15 NOTE — Unmapped (Signed)
Pt advised and scheduled.

## 2015-06-16 ENCOUNTER — Ambulatory Visit: Admit: 2015-06-16 | Payer: PRIVATE HEALTH INSURANCE

## 2015-06-16 DIAGNOSIS — M545 Low back pain, unspecified: Secondary | ICD-10-CM

## 2015-06-16 MED ORDER — ALPRAZolam (XANAX) 0.25 MG tablet
0.25 | ORAL_TABLET | ORAL | Status: AC
Start: 2015-06-16 — End: 2015-12-01

## 2015-06-16 MED ORDER — valACYclovir (VALTREX) 1000 MG tablet
1000 | ORAL_TABLET | Freq: Two times a day (BID) | ORAL | Status: AC
Start: 2015-06-16 — End: 2016-10-15

## 2015-06-16 MED ORDER — cyclobenzaprine (FLEXERIL) 5 MG tablet
5 | ORAL_TABLET | Freq: Every evening | ORAL | Status: AC
Start: 2015-06-16 — End: 2015-08-26

## 2015-06-16 MED ORDER — naproxen (NAPROSYN) 500 MG tablet
500 | ORAL_TABLET | Freq: Two times a day (BID) | ORAL | Status: AC
Start: 2015-06-16 — End: 2015-08-26

## 2015-06-16 NOTE — Unmapped (Signed)
F/ in 3 mths

## 2015-06-16 NOTE — Unmapped (Signed)
Subjective  HPI:   Patient ID: Sherry Compton is a 49 y.o. female.    Chief Complaint:  HPI       Chief Complaint   Patient presents with   ??? Medication Refill     all medications; pt is also asking to change from valtrex to something she takes everyday; gave pt gad7       Here for follow up   working 3rd shift   back hurts   takes 2 of naproxen  H/o anxiety ,herpes labialis         Over the last 2 weeks, how often have you been bothered by the following problems?  Feeling nervous, anxious or on edge:: Nearly every day  Not being able to stop or control worrying:: More than half the days  Worrying too much about different things:: Nearly every day  Trouble relaxing:: More than half the days  Being so restless that it is hard to sit still:: More than half the days  Becoming easily annoyed or irritable:: Nearly every day  Feeling afraid as if something awful might happen:: Several days  Total Score: 16  If you checked off any problems, how difficult have these problems made it for you to do your work, take care of things at home, or get along with other people?: Somewhat difficult     Past Medical History   Diagnosis Date   ??? Anxiety      Past Surgical History   Procedure Laterality Date   ??? Augmentation breast endoscopic       History     Social History   ??? Marital Status: Married     Spouse Name: N/A   ??? Number of Children: N/A   ??? Years of Education: N/A     Occupational History   ??? Not on file.     Social History Main Topics   ??? Smoking status: Former Smoker   ??? Smokeless tobacco: Not on file   ??? Alcohol Use: No   ??? Drug Use: No   ??? Sexual Activity: Not on file     Other Topics Concern   ??? Caffeine Use Yes   ??? Exercise No   ??? Seat Belt Yes     Social History Narrative     Family History   Problem Relation Age of Onset   ??? Migraines Sister    ??? Diabetes Paternal Grandmother    ??? Heart disease Paternal Grandmother    ??? Miscarriages / Stillbirths Paternal Grandmother          Medications:  Current Outpatient  Prescriptions   Medication Sig Dispense Refill   ??? ALPRAZolam (XANAX) 0.25 MG tablet 1tablet daily as needed 30 tablet 2   ??? naproxen (NAPROSYN) 500 MG tablet Take 1 tablet (500 mg total) by mouth 2 times a day with meals. 60 tablet 0   ??? valACYclovir (VALTREX) 1000 MG tablet Take 1 tablet (1,000 mg total) by mouth 2 times a day. For 3 days 30 tablet 2     No current facility-administered medications for this visit.        ROS:   Review of Systems   Constitutional: Positive for weight loss. Negative for fatigue.        Voluntary weight loss   Respiratory: Negative for chest tightness and shortness of breath.    Cardiovascular: Negative for chest pain and palpitations.   Gastrointestinal: Negative for abdominal pain.   Musculoskeletal: Positive for myalgias and back pain.  Negative for arthralgias, neck pain and neck stiffness.        Has pain in back at work   she doe slot of mopping  Pain in lower back with no radiation or tingling   Neurological: Negative for dizziness, tremors, speech difficulty, weakness and light-headedness.   Psychiatric/Behavioral: Negative for dysphoric mood. The patient is nervous/anxious.         Has been anxious due to upcoming wedding of her dtr          Objective:   Physical Exam   Vitals reviewed.  Constitutional: She appears well-developed and well-nourished.   Cardiovascular: Normal rate, regular rhythm, normal heart sounds and intact distal pulses.    No murmur heard.  Pulmonary/Chest: Effort normal and breath sounds normal.   Abdominal: Soft.   Psychiatric: She has a normal mood and affect. Her behavior is normal. Judgment and thought content normal.             Filed Vitals:    06/16/15 1511   BP: 114/70   Pulse: 62   Temp: 98 ??F (36.7 ??C)   TempSrc: Oral   Height: 5' 9.5 (1.765 m)   Weight: 227 lb (102.967 kg)   SpO2: 98%     Body mass index is 33.05 kg/(m^2).  Body surface area is 2.25 meters squared.         Lab Results   Component Value Date    WBC 7.2 10/22/2012    HGB 12.9  10/22/2012    HCT 39.2 10/22/2012    MCV 93.2 10/22/2012    PLT 250 10/22/2012     Lab Results   Component Value Date    HGBA1C 4.8 10/22/2012     Lab Results   Component Value Date    CHOLTOT 164 10/22/2012    TRIG 119 10/22/2012    HDL 50 10/22/2012    LDL 90 10/22/2012     Lab Results   Component Value Date    TSH 2.53 10/22/2012     Lab Results   Component Value Date    VITD25H 19.0* 10/22/2012     No results found for: GLUCOSE, BUN, CO2, CREATININE, K, NA, CL, CALCIUM, ALBUMIN, PROT, ALKPHOS, ALT, AST, BILITOT           Assessment/Plan:   Back pain   naproxen nd flexeril   herpes labialis rx for valtrex given   anxiety rx for xanax refilled    The patient understands his/her medication(s).  Side effects/barriers of these medications were addressed.  Over-the-counter medications were also addressed during this encounter.

## 2015-07-13 MED ORDER — cefUROXime (CEFTIN) 250 MG tablet
250 | ORAL_TABLET | Freq: Two times a day (BID) | ORAL | 0.00 refills | 5.00000 days | Status: AC
Start: 2015-07-13 — End: 2015-07-16

## 2015-07-13 NOTE — Unmapped (Signed)
LMOM advised per Dr. Singh.

## 2015-07-13 NOTE — Unmapped (Signed)
Pt called in-  She said that for a week now she has been battling a UTI   C/O  Burning, urgency, strong odor   She said that she has been drinking a lot of cranberry and water  Would like a rx sent to pharmacy

## 2015-07-13 NOTE — Unmapped (Signed)
Ok  Prescription(s) were electronically prescribed today

## 2015-08-26 MED ORDER — cyclobenzaprine (FLEXERIL) 5 MG tablet
5 | ORAL_TABLET | Freq: Every evening | ORAL | Status: AC
Start: 2015-08-26 — End: 2015-12-01

## 2015-08-26 MED ORDER — naproxen (NAPROSYN) 500 MG tablet
500 | ORAL_TABLET | Freq: Two times a day (BID) | ORAL | Status: AC
Start: 2015-08-26 — End: 2015-12-01

## 2015-08-26 NOTE — Unmapped (Signed)
Cyclobenzaprine 5 mg  LR- 06/16/15 #30x0    Naproxen 500 mg  LR- 06/16/15 #60x0  LOV- 06/16/15  Lab Results   Component Value Date    LDL 90 10/22/2012    HGBA1C 4.8 10/22/2012    TSH 2.53 10/22/2012    VITD25H 19.0* 10/22/2012

## 2015-12-01 ENCOUNTER — Ambulatory Visit: Admit: 2015-12-01 | Payer: PRIVATE HEALTH INSURANCE

## 2015-12-01 DIAGNOSIS — Z0000 Encounter for general adult medical examination without abnormal findings: Secondary | ICD-10-CM

## 2015-12-01 MED ORDER — ALPRAZolam (XANAX) 0.25 MG tablet
0.25 | ORAL_TABLET | ORAL | Status: AC
Start: 2015-12-01 — End: 2016-03-15

## 2015-12-01 MED ORDER — cyclobenzaprine (FLEXERIL) 5 MG tablet
5 | ORAL_TABLET | Freq: Every evening | ORAL | Status: AC
Start: 2015-12-01 — End: 2016-07-10

## 2015-12-01 MED ORDER — naproxen (NAPROSYN) 500 MG tablet
500 | ORAL_TABLET | Freq: Two times a day (BID) | ORAL | Status: AC
Start: 2015-12-01 — End: 2016-03-15

## 2015-12-01 NOTE — Unmapped (Signed)
Your goals based on today's visit are:(BRING THIS TO YOUR NEXT APPOINTMENT)   1. improve your diet by: low fat low carb diet  2. increase your exercise by: aerobic exercise 30 min 3 times a week  3. Blood Pressure: <140/90   4. Cholesterol: Triglycerides <200, HDL >50, LDL <130   5. Other: Flushot in the fall   6. Your BMI is Body mass index is 30.7 kg/(m^2). so you should try to lose  25  pounds over the next year to reach your ideal body weight  7. Annual eye exam  8. Cancer screenings: Pap smears for women 21-65, mammograms for women 50-75, and colonoscopy : for patients over 14 years of age    How confident are you that you can work on these goals : fair  What barriers are keeping you from working toward these goals: time and motivation    Follow up appointment: 1 yr    You can find a lot of good health information online at [email or web address], click on conditions and treatment at the top of the page or in the about [place] section there are some excellent links (to the ADA etc)     You may get a call or mailing about how satisfied you were with the care you received today. Please do take the time to give [place] your input. It will help [place] to improve the care you and others receive in the future. We thank you in advance for doing this!

## 2015-12-01 NOTE — Unmapped (Signed)
Subjective  HPI:   Patient ID: Sherry Compton is a 49 y.o. female.    Chief Complaint:  HPI       Chief Complaint   Patient presents with   ??? Annual Exam   ??? Medication Refill     xanax, flexeril, naproxen (oarrs consistent & documented)        here for a physical   doing well  H/o back pain would like refill of her  meds  Past Medical History   Diagnosis Date   ??? Anxiety      Past Surgical History   Procedure Laterality Date   ??? Augmentation breast endoscopic       Social History     Social History   ??? Marital Status: Married     Spouse Name: N/A   ??? Number of Children: N/A   ??? Years of Education: N/A     Occupational History   ??? Not on file.     Social History Main Topics   ??? Smoking status: Former Smoker   ??? Smokeless tobacco: Not on file   ??? Alcohol Use: No   ??? Drug Use: No   ??? Sexual Activity: Not on file     Other Topics Concern   ??? Caffeine Use Yes   ??? Exercise No   ??? Seat Belt Yes     Social History Narrative     Family History   Problem Relation Age of Onset   ??? Migraines Sister    ??? Diabetes Paternal Grandmother    ??? Heart disease Paternal Grandmother    ??? Miscarriages / Stillbirths Paternal Grandmother          Medications:  Current Outpatient Prescriptions   Medication Sig Dispense Refill   ??? ALPRAZolam (XANAX) 0.25 MG tablet 1tablet daily as needed 30 tablet 2   ??? cyclobenzaprine (FLEXERIL) 5 MG tablet Take 1 tablet (5 mg total) by mouth at bedtime. 30 tablet 0   ??? naproxen (NAPROSYN) 500 MG tablet Take 1 tablet (500 mg total) by mouth 2 times a day with meals. 60 tablet 0   ??? valACYclovir (VALTREX) 1000 MG tablet Take 1 tablet (1,000 mg total) by mouth 2 times a day. For 3 days 30 tablet 2     No current facility-administered medications for this visit.        ROS:   Review of Systems   Constitutional: Negative for fever, activity change, appetite change and fatigue.   HENT: Negative for congestion, ear pain, sinus pressure, sore throat, trouble swallowing and voice change.    Eyes: Negative for  photophobia, pain, discharge, redness and visual disturbance.   Respiratory: Negative for cough, chest tightness, shortness of breath and wheezing.    Cardiovascular: Negative for chest pain, palpitations and leg swelling.   Gastrointestinal: Negative for nausea, vomiting, abdominal pain, diarrhea and constipation.   Genitourinary: Negative for dysuria, urgency, hematuria, decreased urine volume, difficulty urinating and pelvic pain.   Musculoskeletal: Negative for myalgias, back pain, joint swelling, arthralgias, neck pain and neck stiffness.   Skin: Negative for rash.   Neurological: Negative for dizziness, tremors, speech difficulty, weakness, light-headedness, numbness and headaches.   Hematological: Negative for adenopathy.   Psychiatric/Behavioral: Negative for sleep disturbance and dysphoric mood. The patient is not nervous/anxious.           Objective:   Physical Exam   Vitals reviewed.  Constitutional: She is oriented to person, place, and time. She appears well-developed.   HENT:  Head: Normocephalic and atraumatic.   Right Ear: External ear normal.   Left Ear: External ear normal.   Nose: Nose normal.   Mouth/Throat: Oropharynx is clear and moist. No oropharyngeal exudate.   Eyes: Conjunctivae and EOM are normal. Pupils are equal, round, and reactive to light. Left eye exhibits no discharge. No scleral icterus.   Neck: Normal range of motion. Neck supple. No thyromegaly present.   Cardiovascular: Normal rate, regular rhythm, normal heart sounds and intact distal pulses.    No murmur heard.  Pulmonary/Chest: Effort normal and breath sounds normal. No respiratory distress. She has no wheezes. She exhibits no tenderness.   Abdominal: Soft. Bowel sounds are normal. She exhibits no distension and no mass. There is no tenderness. There is no rebound and no guarding.   Musculoskeletal: Normal range of motion. She exhibits no edema or tenderness.   Lymphadenopathy:     She has no cervical adenopathy.    Neurological: She is alert and oriented to person, place, and time. She has normal reflexes. She displays normal reflexes. No cranial nerve deficit. She exhibits normal muscle tone. Coordination normal.   Skin: Skin is warm.   Psychiatric: She has a normal mood and affect. Her behavior is normal. Judgment and thought content normal.             Filed Vitals:    12/01/15 1517   BP: 118/88   Pulse: 69   Temp: 97.7 ??F (36.5 ??C)   TempSrc: Oral   Height: 5' 9 (1.753 m)   Weight: 208 lb (94.348 kg)   SpO2: 98%     There is no weight on file to calculate BMI.  There is no height or weight on file to calculate BSA.           Wt Readings from Last 3 Encounters:   12/01/15 208 lb (94.348 kg)   06/16/15 227 lb (102.967 kg)   02/07/15 266 lb 12.8 oz (121.02 kg)     Lab Results   Component Value Date    WBC 7.2 10/22/2012    HGB 12.9 10/22/2012    HCT 39.2 10/22/2012    MCV 93.2 10/22/2012    PLT 250 10/22/2012     Lab Results   Component Value Date    HGBA1C 4.8 10/22/2012     Lab Results   Component Value Date    CHOLTOT 164 10/22/2012    TRIG 119 10/22/2012    HDL 50 10/22/2012    LDL 90 10/22/2012     Lab Results   Component Value Date    TSH 2.53 10/22/2012     Lab Results   Component Value Date    VITD25H 19.0* 10/22/2012     No results found for: GLUCOSE, BUN, CO2, CREATININE, K, NA, CL, CALCIUM, ALBUMIN, PROT, ALKPHOS, ALT, AST, BILITOT         Assessment/Plan:   Well exam doing well    Back pain with muscle spasm     naproxen and flexeril refilled   anxiety stable prn xanac  S/p bariatric sleeve procedure doing well  See pt isntructiosn  The patient understands his/her medication(s).  Side effects/barriers of these medications were addressed.  Over-the-counter medications were also addressed during this encounter.\\     pt to schedule pap early january

## 2015-12-05 NOTE — Unmapped (Signed)
Pt states Dr. August Saucer already called her and informed her.

## 2015-12-05 NOTE — Unmapped (Signed)
Order placed for syphilis test

## 2015-12-05 NOTE — Unmapped (Signed)
LMTCB

## 2015-12-05 NOTE — Unmapped (Signed)
Pt called back and hung up while checking for message in her chart

## 2015-12-05 NOTE — Unmapped (Signed)
Pt states that she donates plasma and received notice that her STS [place] TP/PA was non reactive. Therefore, has been advise to have labs performed. Please advise, thanks

## 2015-12-07 ENCOUNTER — Other Ambulatory Visit: Admit: 2015-12-07 | Payer: PRIVATE HEALTH INSURANCE

## 2015-12-07 DIAGNOSIS — Z0000 Encounter for general adult medical examination without abnormal findings: Secondary | ICD-10-CM

## 2015-12-07 LAB — COMPREHENSIVE METABOLIC PANEL
ALT: 8 U/L (ref 7–52)
AST: 12 U/L (ref 13–39)
Albumin: 3.6 g/dL (ref 3.5–5.7)
Alkaline Phosphatase: 51 U/L (ref 36–125)
Anion Gap: 5 mmol/L (ref 3–16)
BUN: 9 mg/dL (ref 7–25)
CO2: 29 mmol/L (ref 21–33)
Calcium: 8.6 mg/dL (ref 8.6–10.3)
Chloride: 108 mmol/L (ref 98–110)
Creatinine: 0.67 mg/dL (ref 0.60–1.30)
Glucose: 94 mg/dL (ref 70–100)
Osmolality, Calculated: 292 mOsm/kg (ref 278–305)
Potassium: 4.3 mmol/L (ref 3.5–5.3)
Sodium: 142 mmol/L (ref 133–146)
Total Bilirubin: 0.4 mg/dL (ref 0.0–1.5)
Total Protein: 5.7 g/dL (ref 6.4–8.9)
eGFR AA CKD-EPI: >90 See note.
eGFR NONAA CKD-EPI: >90 See note.

## 2015-12-07 LAB — CBC
Hematocrit: 38.8 % (ref 35.0–45.0)
Hemoglobin: 12.5 g/dL (ref 11.7–15.5)
MCH: 30.5 pg (ref 27.0–33.0)
MCHC: 32.2 g/dL (ref 32.0–36.0)
MCV: 94.8 fL (ref 80.0–100.0)
MPV: 8.7 fL (ref 7.5–11.5)
Platelets: 208 10E3/uL (ref 140–400)
RBC: 4.1 10E6/uL (ref 3.80–5.10)
RDW: 13.8 % (ref 11.0–15.0)
WBC: 5.7 10E3/uL (ref 3.8–10.8)

## 2015-12-07 LAB — LIPID PANEL
Cholesterol, Total: 162 mg/dL (ref 0–200)
HDL: 57 mg/dL — ABNORMAL LOW (ref 60–92)
LDL Cholesterol: 92 mg/dL
Triglycerides: 63 mg/dL (ref 10–149)

## 2015-12-07 LAB — HEMOGLOBIN A1C: Hemoglobin A1C: 5.1 % (ref 4.8–6.4)

## 2015-12-07 LAB — VITAMIN D 25 HYDROXY: Vit D, 25-Hydroxy: 17.5 ng/mL (ref 30.0–100)

## 2015-12-07 LAB — TREPONEMA PALLIDUM AB WITH REFLEX: Treponema Pallidum: NEGATIVE

## 2015-12-07 LAB — TSH: TSH: 1.99 u[IU]/mL (ref 0.34–5.60)

## 2015-12-08 NOTE — Unmapped (Signed)
pls fax pt's syphilis test   pls check if you need a release of records signature form pt

## 2015-12-08 NOTE — Unmapped (Signed)
Dominique with [facility], calling to request pt's results from the repeat blood lab test to be faxed to her at ATTN: Dondra Prader [phone] to add to their system. Please advise, thanks

## 2015-12-09 NOTE — Unmapped (Signed)
Per Dondra Prader is exempt from [facility], being pt contacted them regarding test, and Dr. August Saucer her pcp ordered. Information faxed.

## 2015-12-22 MED ORDER — ergocalciferol (ERGOCALCIFEROL) 50,000 unit capsule
1250 | ORAL_CAPSULE | ORAL | Status: AC
Start: 2015-12-22 — End: 2016-03-09

## 2016-01-05 ENCOUNTER — Ambulatory Visit: Admit: 2016-01-05 | Payer: PRIVATE HEALTH INSURANCE

## 2016-01-05 DIAGNOSIS — Z01419 Encounter for gynecological examination (general) (routine) without abnormal findings: Secondary | ICD-10-CM

## 2016-01-05 LAB — HPV HIGH RISK WITH GENOTYPING
HPV DNA High Risk Oth: NEGATIVE
HPV Genotype 16: NEGATIVE
HPV Genotype 18: NEGATIVE

## 2016-01-05 NOTE — Unmapped (Signed)
F/u  As needed

## 2016-01-09 NOTE — Unmapped (Signed)
Subjective  HPI:   Patient ID: Sherry Compton is a 50 y.o. female.    Chief Complaint:  HPI       Here for a pap   doing well   her periods are doing well  Regular not heavy no cramping   no hot flashes  Past Medical History   Diagnosis Date   ??? Anxiety      Past Surgical History   Procedure Laterality Date   ??? Augmentation breast endoscopic     ??? Gastric sleeve bariatric surgeyr by dr Clementeen Graham       Social History     Social History   ??? Marital Status: Married     Spouse Name: N/A   ??? Number of Children: N/A   ??? Years of Education: N/A     Occupational History   ??? Not on file.     Social History Main Topics   ??? Smoking status: Former Smoker   ??? Smokeless tobacco: Not on file   ??? Alcohol Use: No   ??? Drug Use: No   ??? Sexual Activity: Not on file     Other Topics Concern   ??? Caffeine Use Yes   ??? Exercise No   ??? Seat Belt Yes     Social History Narrative     Family History   Problem Relation Age of Onset   ??? Migraines Sister    ??? Diabetes Paternal Grandmother    ??? Heart disease Paternal Grandmother    ??? Miscarriages / Stillbirths Paternal Grandmother        Medications:  Current Outpatient Prescriptions   Medication Sig Dispense Refill   ??? ALPRAZolam (XANAX) 0.25 MG tablet 1tablet daily as needed 30 tablet 2   ??? cyclobenzaprine (FLEXERIL) 5 MG tablet Take 1 tablet (5 mg total) by mouth at bedtime. 30 tablet 0   ??? ergocalciferol (ERGOCALCIFEROL) 50,000 unit capsule Take 1 capsule (50,000 Units total) by mouth once a week. 12 capsule 0   ??? naproxen (NAPROSYN) 500 MG tablet Take 1 tablet (500 mg total) by mouth 2 times a day with meals. 60 tablet 0   ??? valACYclovir (VALTREX) 1000 MG tablet Take 1 tablet (1,000 mg total) by mouth 2 times a day. For 3 days 30 tablet 2   ??? ZOLMitriptan (ZOMIG) 2.5 MG tablet Take 2.5 mg by mouth once as needed for Migraine.       No current facility-administered medications for this visit.        ROS:   Review of Systems   Constitutional: Negative for fatigue.   Genitourinary: Negative for  dysuria, urgency, frequency, hematuria, flank pain, decreased urine volume, vaginal bleeding, vaginal discharge, difficulty urinating and pelvic pain.   Neurological: Negative for dizziness, light-headedness and headaches.          Objective:   Physical Exam   Vitals reviewed.  Constitutional: She appears well-developed and well-nourished.   Cardiovascular: Normal rate, regular rhythm, normal heart sounds and intact distal pulses.    No murmur heard.  Pulmonary/Chest: Effort normal and breath sounds normal.   Abdominal: Soft. She exhibits no mass. There is no tenderness. There is no rebound and no guarding.   Genitourinary: Vagina normal and uterus normal. No breast swelling, tenderness, discharge or bleeding. There is no rash or tenderness on the right labia. There is no rash or tenderness on the left labia. Cervix exhibits no motion tenderness. Right adnexum displays no mass and no tenderness. Left adnexum displays no  mass and no tenderness.   Cervical ectropion and nabothian cysts noted.   Musculoskeletal: Normal range of motion.             Filed Vitals:    01/05/16 1519   BP: 118/74   Pulse: 71   Temp: 98.3 ??F (36.8 ??C)   TempSrc: Oral   Resp: 12   Weight: 204 lb (92.534 kg)   SpO2: 98%     Body mass index is 30.11 kg/(m^2).  There is no height on file to calculate BSA.           Lab Results   Component Value Date    WBC 5.7 12/07/2015    HGB 12.5 12/07/2015    HCT 38.8 12/07/2015    MCV 94.8 12/07/2015    PLT 208 12/07/2015     Lab Results   Component Value Date    HGBA1C 5.1 12/07/2015     Lab Results   Component Value Date    CHOLTOT 162 12/07/2015    TRIG 63 12/07/2015    HDL 57* 12/07/2015    LDL 92 12/07/2015     Lab Results   Component Value Date    TSH 1.99 12/07/2015     Lab Results   Component Value Date    VITD25H 17.5* 12/07/2015     Lab Results   Component Value Date    GLUCOSE 94 12/07/2015    BUN 9 12/07/2015    CO2 29 12/07/2015    CREATININE 0.67 12/07/2015    K 4.3 12/07/2015    NA 142  12/07/2015    CL 108 12/07/2015    CALCIUM 8.6 12/07/2015    ALBUMIN 3.6 12/07/2015    PROT 5.7* 12/07/2015    ALKPHOS 51 12/07/2015    ALT 8 12/07/2015    AST 12* 12/07/2015    BILITOT 0.4 12/07/2015            Assessment/Plan:     Well exam,gyn routine  See p tinstructions  Mammogram ordered   colonoscopy when she turns 50 yr ordered  The patient understands his/her medication(s).  Side effects/barriers of these medications were addressed.  Over-the-counter medications were also addressed during this encounter.

## 2016-03-09 MED ORDER — ergocalciferol (ERGOCALCIFEROL) 50,000 unit capsule
1250 | ORAL_CAPSULE | ORAL | Status: AC
Start: 2016-03-09 — End: 2016-05-31

## 2016-03-15 MED ORDER — naproxen (NAPROSYN) 500 MG tablet
500 | ORAL_TABLET | Freq: Two times a day (BID) | ORAL | Status: AC
Start: 2016-03-15 — End: 2016-04-11

## 2016-03-15 MED ORDER — ALPRAZolam (XANAX) 0.25 MG tablet
0.25 | ORAL_TABLET | ORAL | Status: AC
Start: 2016-03-15 — End: 2016-07-10

## 2016-03-15 MED ORDER — cyclobenzaprine (FLEXERIL) 5 MG tablet
5 | ORAL_TABLET | Freq: Every evening | ORAL | Status: AC
Start: 2016-03-15 — End: 2016-07-10

## 2016-03-15 NOTE — Unmapped (Signed)
Pt needs refills on Xanax, naproxen and cyclobenzaprine

## 2016-04-12 MED ORDER — naproxen (NAPROSYN) 500 MG tablet
500 | ORAL_TABLET | ORAL | Status: AC
Start: 2016-04-12 — End: 2016-05-13

## 2016-05-14 MED ORDER — naproxen (NAPROSYN) 500 MG tablet
500 | ORAL_TABLET | ORAL | Status: AC
Start: 2016-05-14 — End: 2016-06-08

## 2016-06-01 MED ORDER — ergocalciferol (ERGOCALCIFEROL) 50,000 unit capsule
1250 | ORAL_CAPSULE | ORAL | Status: AC
Start: 2016-06-01 — End: 2016-08-24

## 2016-06-11 MED ORDER — naproxen (NAPROSYN) 500 MG tablet
500 | ORAL_TABLET | ORAL | Status: AC
Start: 2016-06-11 — End: 2016-07-10

## 2016-07-09 MED ORDER — cephALEXin (KEFLEX) 500 MG capsule
500 | ORAL_CAPSULE | Freq: Two times a day (BID) | ORAL | Status: AC
Start: 2016-07-09 — End: 2016-11-01

## 2016-07-10 MED ORDER — naproxen (NAPROSYN) 500 MG tablet
500 | ORAL_TABLET | Freq: Two times a day (BID) | ORAL | Status: AC
Start: 2016-07-10 — End: 2016-08-11

## 2016-07-11 MED ORDER — ALPRAZolam (XANAX) 0.25 MG tablet
0.25 | ORAL_TABLET | ORAL | Status: AC
Start: 2016-07-11 — End: 2016-10-01

## 2016-07-11 MED ORDER — cyclobenzaprine (FLEXERIL) 5 MG tablet
5 | ORAL_TABLET | ORAL | Status: AC
Start: 2016-07-11 — End: 2016-08-17

## 2016-07-11 MED ORDER — cyclobenzaprine (FLEXERIL) 5 MG tablet
5 | ORAL_TABLET | ORAL | Status: AC
Start: 2016-07-11 — End: 2017-05-14

## 2016-08-01 NOTE — Unmapped (Signed)
Addended by: Maree Erie on: 08/01/2016 08:15 PM     Modules accepted: Orders

## 2016-08-01 NOTE — Unmapped (Signed)
Order placed for urine drug screen  Pt informed

## 2016-08-01 NOTE — Unmapped (Signed)
Pt states when she got her urine tested on July 26th it showed positive for opioids and THC and she states its impossible, did some research and found that if she ate poppy seeds it would make a positive THC test and she remembered she did eat 2 poppy seed muffins. She states she did her urine again today and it is still positive for opioids but neg for THC, pt is worried that something is going on in her body because she has not done drugs. How long does it take for poppy seeds to go out of your system?

## 2016-08-02 ENCOUNTER — Ambulatory Visit: Admit: 2016-08-02 | Payer: PRIVATE HEALTH INSURANCE

## 2016-08-02 ENCOUNTER — Ambulatory Visit: Admit: 2016-08-03 | Payer: PRIVATE HEALTH INSURANCE

## 2016-08-02 DIAGNOSIS — Z79899 Other long term (current) drug therapy: Secondary | ICD-10-CM

## 2016-08-02 DIAGNOSIS — R825 Elevated urine levels of drugs, medicaments and biological substances: Secondary | ICD-10-CM

## 2016-08-03 LAB — URINE DRUG COMPREHENSIVE PANEL, CONFIRMATION
Creatinine, Ur: 163.5 mg/dL
Naltrexone: 466 ng/mL
Oxidant: NEGATIVE
Specific Gravity: 1.02 (ref 1.003–1.035)
pH: 7.9 — ABNORMAL HIGH (ref 4.7–7.8)

## 2016-08-06 NOTE — Unmapped (Signed)
[facility]  [facility]  [address]  [place] [address]    Name:  Sherry Compton Date of Birth: Feb 15, 1966 (50 y.o.)   MRN: [id number]    Date of Service:  08/06/2016    Subjective  Chief Complaint:     Chief Complaint   Patient presents with   ??? Drug Screen       History of Present Illness:   Sherry Compton is a(n) 50 y.o. female here today for the following:     HPI  Here uds  Current Outpatient Medications:  Current Outpatient Prescriptions   Medication Sig Dispense Refill   ??? ALPRAZolam (XANAX) 0.25 MG tablet TAKE 1 TABLET BY MOUTH EVERY DAY AS NEEDED 30 tablet 0   ??? cephALEXin (KEFLEX) 500 MG capsule Take 1 capsule (500 mg total) by mouth 2 times a day. 14 capsule 0   ??? cyclobenzaprine (FLEXERIL) 5 MG tablet TAKE 1 TABLET(5 MG) BY MOUTH AT BEDTIME 30 tablet 0   ??? cyclobenzaprine (FLEXERIL) 5 MG tablet TAKE 1 TABLET(5 MG) BY MOUTH AT BEDTIME 30 tablet 0   ??? ergocalciferol (ERGOCALCIFEROL) 50,000 unit capsule TAKE 1 CAPSULE BY MOUTH 1 TIME A WEEK 12 capsule 0   ??? naproxen (NAPROSYN) 500 MG tablet Take 1 tablet (500 mg total) by mouth 2 times a day with meals. 60 tablet 0   ??? valACYclovir (VALTREX) 1000 MG tablet Take 1 tablet (1,000 mg total) by mouth 2 times a day. For 3 days 30 tablet 2   ??? ZOLMitriptan (ZOMIG) 2.5 MG tablet Take 2.5 mg by mouth once as needed for Migraine.       No current facility-administered medications for this visit.         ROS:   Review of Systems       Objective:   There were no vitals filed for this visit.  There is no weight on file to calculate BMI.    Physical Exam          Assessment/Plan:   Sherry Compton was seen today for drug screen.    Diagnoses and all orders for this visit:    Positive urine drug screen      No Follow-up on file.       Sherry Kells, MD

## 2016-08-13 MED ORDER — naproxen (NAPROSYN) 500 MG tablet
500 | ORAL_TABLET | ORAL | Status: AC
Start: 2016-08-13 — End: 2016-09-06

## 2016-08-20 MED ORDER — cyclobenzaprine (FLEXERIL) 5 MG tablet
5 | ORAL_TABLET | ORAL | Status: AC
Start: 2016-08-20 — End: 2016-10-08

## 2016-08-29 MED ORDER — ergocalciferol (ERGOCALCIFEROL) 50,000 unit capsule
1250 | ORAL_CAPSULE | ORAL | Status: AC
Start: 2016-08-29 — End: 2016-12-04

## 2016-09-07 MED ORDER — naproxen (NAPROSYN) 500 MG tablet
500 | ORAL_TABLET | ORAL | 5 refills | Status: AC
Start: 2016-09-07 — End: 2017-03-14

## 2016-10-01 NOTE — Telephone Encounter (Signed)
Markela # [phone]. Pt has appt on 01/02/2016.  Call pt and inform her.

## 2016-10-03 MED ORDER — ALPRAZolam (XANAX) 0.25 MG tablet
0.25 | ORAL_TABLET | Freq: Every day | ORAL | 0 refills | Status: AC | PRN
Start: 2016-10-03 — End: 2016-11-01

## 2016-10-03 NOTE — Telephone Encounter (Signed)
pls run oarss and if normal call rx in

## 2016-10-03 NOTE — Telephone Encounter (Signed)
rx was was sent in for xanax on 10/03/2016

## 2016-10-09 MED ORDER — cyclobenzaprine (FLEXERIL) 5 MG tablet
5 | ORAL_TABLET | ORAL | 0 refills | Status: AC
Start: 2016-10-09 — End: 2016-11-01

## 2016-10-15 MED ORDER — valACYclovir (VALTREX) 1000 MG tablet
1000 | ORAL_TABLET | Freq: Two times a day (BID) | ORAL | 2 refills | Status: AC
Start: 2016-10-15 — End: 2016-10-15

## 2016-10-16 MED ORDER — valACYclovir (VALTREX) 1000 MG tablet
1000 | ORAL_TABLET | Freq: Two times a day (BID) | ORAL | 2 refills | Status: AC
Start: 2016-10-16 — End: 2018-09-04

## 2016-11-01 ENCOUNTER — Ambulatory Visit: Admit: 2016-11-01 | Payer: PRIVATE HEALTH INSURANCE

## 2016-11-01 DIAGNOSIS — Z0000 Encounter for general adult medical examination without abnormal findings: Secondary | ICD-10-CM

## 2016-11-01 MED ORDER — busPIRone (BUSPAR) 5 MG tablet
5 | ORAL_TABLET | Freq: Two times a day (BID) | ORAL | 1 refills | Status: AC
Start: 2016-11-01 — End: 2017-05-14

## 2016-11-01 MED ORDER — ALPRAZolam (XANAX) 0.25 MG tablet
0.25 | ORAL_TABLET | Freq: Every day | ORAL | 0 refills | Status: AC | PRN
Start: 2016-11-01 — End: 2017-09-12

## 2016-11-01 NOTE — Unmapped (Signed)
Your goals based on today's visit are:(BRING THIS TO YOUR NEXT APPOINTMENT)   1. improve your diet by: low fat low carb diet  2. increase your exercise by: continue present regimen  3. Blood Pressure: <140/90   4. Cholesterol: Triglycerides <200, HDL >50, LDL <130   5. Other: Flushot in the fall   6. Your BMI is Body mass index is 28.24 kg/m??. so you should try to lose 15  pounds over the next year to reach your ideal body weight  7. Annual eye exam  8. Cancer screenings: Pap smears for women 21-65, mammograms for women 50-75, and colonoscopy : for patients over 3 years of age    How confident are you that you can work on these goals : fair  What barriers are keeping you from working toward these goals:none    Follow up appointment: 66yr    You can find a lot of good health information online at [email or web address], click on conditions and treatment at the top of the page or in the about [place] section there are some excellent links (to the ADA etc)     You may get a call or mailing about how satisfied you were with the care you received today. Please do take the time to give [place] your input. It will help [place] to improve the care you and others receive in the future. We thank you in advance for doing this!

## 2016-11-01 NOTE — Progress Notes (Signed)
[facility]  [facility]  [address]  [place] [address]    Name:  Sherry Compton Date of Birth: September 08, 1966 (50 y.o.)   MRN: [id number]    Date of Service:  11/01/2016     Subjective:       History of Present Illness:  Sherry Compton is a(n) 50 y.o. female here today for the following:   HPI  Here for physical    H/o anxiety on prn ativan  Past Medical History:   Diagnosis Date    Anxiety      Past Surgical History:   Procedure Laterality Date    AUGMENTATION BREAST ENDOSCOPIC      gastric sleeve bariatric surgeyr by dr Clementeen Graham       Social History     Social History    Marital status: Married     Spouse name: N/A    Number of children: N/A    Years of education: N/A     Occupational History    Not on file.     Social History Main Topics    Smoking status: Former Smoker    Smokeless tobacco: Not on file    Alcohol use No    Drug use: No    Sexual activity: Yes     Partners: Male     Other Topics Concern    Caffeine Use Yes    Exercise Yes     2 -3 times a week    Seat Belt Yes     Social History Narrative    No narrative on file     Family History   Problem Relation Age of Onset    Migraines Sister     Diabetes Paternal Grandmother     Heart disease Paternal Grandmother     Miscarriages / Stillbirths Paternal Grandmother        Current Outpatient Prescriptions   Medication Sig Dispense Refill    ALPRAZolam (XANAX) 0.25 MG tablet Take 1 tablet (0.25 mg total) by mouth daily as needed for Sleep or Anxiety. 30 tablet 0    cyclobenzaprine (FLEXERIL) 5 MG tablet TAKE 1 TABLET(5 MG) BY MOUTH AT BEDTIME 30 tablet 0    ergocalciferol (ERGOCALCIFEROL) 50,000 unit capsule TAKE 1 CAPSULE BY MOUTH 1 TIME A WEEK 12 capsule 0    naproxen (NAPROSYN) 500 MG tablet TAKE 1 TABLET(500 MG) BY MOUTH TWICE DAILY WITH MEALS 60 tablet 5    valACYclovir (VALTREX) 1000 MG tablet Take 1 tablet (1,000 mg total) by mouth 2 times a day. For 3 days (Patient taking differently:  Take 1,000 mg by mouth 2 times a day. For 3 days as needed   ) 30 tablet 2    busPIRone (BUSPAR) 5 MG tablet Take 1 tablet (5 mg total) by mouth 2 times a day. 30 tablet 1    ZOLMitriptan (ZOMIG) 2.5 MG tablet Take 2.5 mg by mouth once as needed for Migraine.       No current facility-administered medications for this visit.       Review of Systems   Constitutional: Negative for activity change, appetite change, fatigue and fever.   HENT: Negative for congestion, ear pain, sinus pressure, sore throat, trouble swallowing and voice change.    Eyes: Negative for photophobia, pain, discharge, redness and visual disturbance.   Respiratory: Negative for cough, chest tightness, shortness of breath and wheezing.    Cardiovascular: Negative for chest pain, palpitations and leg  swelling.   Gastrointestinal: Negative for abdominal pain, constipation, diarrhea, nausea and vomiting.   Genitourinary: Negative for decreased urine volume, difficulty urinating, dysuria, flank pain, frequency, hematuria, pelvic pain and urgency.   Musculoskeletal: Negative for arthralgias, back pain, joint swelling, myalgias and neck pain.   Skin: Negative for rash.   Neurological: Negative for dizziness, tremors, speech difficulty, weakness, light-headedness, numbness and headaches.   Hematological: Negative for adenopathy. Does not bruise/bleed easily.   Psychiatric/Behavioral: Negative for agitation, behavioral problems, confusion, decreased concentration, depression, dysphoric mood, hallucinations and sleep disturbance. The patient is nervous/anxious. The patient is not hyperactive.             Objective:     Vitals:    11/01/16 1332   BP: 110/70   Pulse: 97   Temp: 97.8 F (36.6 C)   TempSrc: Oral   SpO2: 98%   Weight: 191 lb 3.2 oz (86.7 kg)     Body mass index is 28.24 kg/m.    Physical Exam   Vitals reviewed.  Constitutional: She is oriented to person, place, and time. She appears well-developed and well-nourished. No distress.   HENT:    Head: Normocephalic and atraumatic.   Right Ear: External ear normal.   Left Ear: External ear normal.   Nose: Nose normal.   Mouth/Throat: Oropharynx is clear and moist. No oropharyngeal exudate.   Eyes: Conjunctivae and EOM are normal. Pupils are equal, round, and reactive to light. No scleral icterus.   Neck: Normal range of motion. Neck supple. No thyromegaly present.   Cardiovascular: Normal rate, regular rhythm, normal heart sounds and intact distal pulses.    No murmur heard.  Pulmonary/Chest: Effort normal and breath sounds normal. She has no wheezes.   Abdominal: Soft. Bowel sounds are normal. She exhibits no distension and no mass. There is no tenderness. There is no rebound and no guarding.   Musculoskeletal: Normal range of motion. She exhibits no edema or tenderness.   Lymphadenopathy:     She has no cervical adenopathy.   Neurological: She is alert and oriented to person, place, and time. She has normal reflexes. She displays normal reflexes. No cranial nerve deficit. She exhibits normal muscle tone. Coordination normal.   Skin: Skin is warm. She is not diaphoretic.   Psychiatric: She has a normal mood and affect. Her behavior is normal. Judgment and thought content normal.     Lab Results   Component Value Date    WBC 5.7 12/07/2015    HGB 12.5 12/07/2015    HCT 38.8 12/07/2015    MCV 94.8 12/07/2015    PLT 208 12/07/2015     Lab Results   Component Value Date    HGBA1C 5.1 12/07/2015     Lab Results   Component Value Date    CHOLTOT 162 12/07/2015    TRIG 63 12/07/2015    HDL 57 (L) 12/07/2015    LDL 92 12/07/2015     Lab Results   Component Value Date    TSH 1.99 12/07/2015     Lab Results   Component Value Date    VITD25H 17.5 (L) 12/07/2015     Lab Results   Component Value Date    GLUCOSE 94 12/07/2015    BUN 9 12/07/2015    CO2 29 12/07/2015    CREATININE 0.67 12/07/2015    K 4.3 12/07/2015    NA 142 12/07/2015    CL 108 12/07/2015    CALCIUM 8.6 12/07/2015  ALBUMIN 3.6 12/07/2015    PROT  5.7 (L) 12/07/2015    ALKPHOS 51 12/07/2015    ALT 8 12/07/2015    AST 12 (L) 12/07/2015    BILITOT 0.4 12/07/2015              Assessment/Plan:       Well exam     Immunization History   Administered Date(s) Administered    tdap 11/01/2016       Lab Results   Component Value Date    HMPAP normal 03/01/2012     Anxiety trial buspar   informed eventually wpuld like to wean her off xanax    Vit d def on supplement  Migraine headaches prn zomig  Herpes labialis prn valtrex  Labs ordered  See PO   Referred for colonoscopy and mammo    See pt instructions    The patient understands his/her medication(s).  Side effects/barriers of these medications were addressed.  Over-the-counter medications were also addressed during this encounter.         Veatrice Kells, MD

## 2016-12-04 MED ORDER — ergocalciferol (ERGOCALCIFEROL) 50,000 unit capsule
1250 | ORAL_CAPSULE | ORAL | 0 refills | Status: AC
Start: 2016-12-04 — End: 2019-08-11

## 2017-01-04 MED ORDER — cyclobenzaprine (FLEXERIL) 5 MG tablet
5 | ORAL_TABLET | ORAL | 0 refills | Status: AC
Start: 2017-01-04 — End: 2017-07-02

## 2017-03-12 MED ORDER — sulfamethoxazole-trimethoprim (BACTRIM DS) 800-160 mg per tablet
800-160 | ORAL_TABLET | Freq: Two times a day (BID) | ORAL | 0 refills | Status: AC
Start: 2017-03-12 — End: 2017-05-14

## 2017-03-12 MED ORDER — sulfamethoxazole-trimethoprim (BACTRIM DS) 800-160 mg per tablet
800-160 | ORAL_TABLET | Freq: Two times a day (BID) | ORAL | 0 refills | Status: AC
Start: 2017-03-12 — End: 2017-03-12

## 2017-03-12 NOTE — Telephone Encounter (Signed)
Pt looking for UTI med for Evisit she did this morning be sent to different pharmacy than what we have on file--[facility] [place] [address]--pls advise pt when done a [phone]

## 2017-03-12 NOTE — Telephone Encounter (Signed)
Advised pt medication was [facility] to [facility] per Dr August Saucer

## 2017-03-14 MED ORDER — naproxen (NAPROSYN) 500 MG tablet
500 | ORAL_TABLET | Freq: Two times a day (BID) | ORAL | 0 refills | Status: AC
Start: 2017-03-14 — End: 2019-07-28

## 2017-03-14 NOTE — Telephone Encounter (Signed)
Denyse Amass at [facility] calling for Naproxen for pt--pls advise at [phone]

## 2017-05-09 ENCOUNTER — Ambulatory Visit: Admit: 2017-05-10 | Payer: PRIVATE HEALTH INSURANCE

## 2017-05-09 ENCOUNTER — Ambulatory Visit: Admit: 2017-05-09 | Payer: PRIVATE HEALTH INSURANCE

## 2017-05-09 DIAGNOSIS — N761 Subacute and chronic vaginitis: Secondary | ICD-10-CM

## 2017-05-09 LAB — BACTERIAL VAGINOSIS PANEL
Candida Species: NEGATIVE
Gardnerella vaginosis: NEGATIVE
Trichomonas vaginosis: NEGATIVE

## 2017-05-09 NOTE — Unmapped (Signed)
Will call with the results

## 2017-05-09 NOTE — Telephone Encounter (Signed)
pls call rx

## 2017-05-09 NOTE — Unmapped (Signed)
[facility]  [facility]  [address]  [place] [address]    Name:  Sherry Compton Date of Birth: 31-Aug-1966 (51 y.o.)   MRN: [id number]    Date of Service:  05/09/2017     Subjective:     No chief complaint on file.    History of Present Illness:  Sherry Compton is a(n) 51 y.o. female here today for the following:   HPI     vaginal discharge  For yr   h/o endometrial ablation  In december  Clear discharge   no dryness   she though it resolved with monistat initially    She had the ablation 4 mths ago  Sx sporadic    lmp was ago?    Pt has mulitple family stressors     states her dtr in law not communicative with family  h/o anxiety    Not taken buspar    Would like rx ativan   uses for extreme anxiety and before travel  Past Medical History:   Diagnosis Date   ??? Anxiety      Past Surgical History:   Procedure Laterality Date   ??? AUGMENTATION BREAST ENDOSCOPIC     ??? gastric sleeve bariatric surgeyr by dr Clementeen Graham       Social History     Social History   ??? Marital status: Married     Spouse name: N/A   ??? Number of children: N/A   ??? Years of education: N/A     Occupational History   ??? Not on file.     Social History Main Topics   ??? Smoking status: Former Smoker   ??? Smokeless tobacco: Not on file   ??? Alcohol use No   ??? Drug use: No   ??? Sexual activity: Yes     Partners: Male     Other Topics Concern   ??? Caffeine Use Yes   ??? Exercise Yes     2 -3 times a week   ??? Seat Belt Yes     Social History Narrative   ??? No narrative on file     Family History   Problem Relation Age of Onset   ??? Migraines Sister    ??? Diabetes Paternal Grandmother    ??? Heart disease Paternal Grandmother    ??? Miscarriages / Stillbirths Paternal Grandmother          Current Outpatient Prescriptions   Medication Sig Dispense Refill   ??? ALPRAZolam (XANAX) 0.25 MG tablet Take 1 tablet (0.25 mg total) by mouth daily as needed for Sleep or Anxiety. 30 tablet 0   ??? busPIRone (BUSPAR) 5 MG tablet Take 1 tablet  (5 mg total) by mouth 2 times a day. 30 tablet 1   ??? cyclobenzaprine (FLEXERIL) 5 MG tablet TAKE 1 TABLET(5 MG) BY MOUTH AT BEDTIME 30 tablet 0   ??? cyclobenzaprine (FLEXERIL) 5 MG tablet TAKE 1 TABLET(5 MG) BY MOUTH AT BEDTIME 30 tablet 0   ??? ergocalciferol (ERGOCALCIFEROL) 50,000 unit capsule TAKE 1 CAPSULE BY MOUTH 1 TIME A WEEK 12 capsule 0   ??? naproxen (NAPROSYN) 500 MG tablet Take 1 tablet (500 mg total) by mouth 2 times a day with meals. As needed 60 tablet 0   ??? sulfamethoxazole-trimethoprim (BACTRIM DS) 800-160 mg per tablet Take 1 tablet by mouth 2 times a day. 6 tablet 0   ??? valACYclovir (VALTREX) 1000 MG tablet Take 1  tablet (1,000 mg total) by mouth 2 times a day. For 3 days (Patient taking differently: Take 1,000 mg by mouth 2 times a day. For 3 days as needed   ) 30 tablet 2   ??? ZOLMitriptan (ZOMIG) 2.5 MG tablet Take 2.5 mg by mouth once as needed for Migraine.       No current facility-administered medications for this visit.       Review of Systems   Constitutional: Negative for fatigue and fever.   Respiratory: Negative for chest tightness and shortness of breath.    Cardiovascular: Negative for chest pain, palpitations and leg swelling.   Genitourinary: Positive for vaginal discharge. Negative for decreased urine volume, difficulty urinating, dysuria, frequency, pelvic pain, urgency and vaginal pain.   Musculoskeletal: Negative for myalgias.   Neurological: Negative for dizziness, light-headedness and headaches.   Psychiatric/Behavioral: Negative for depression, dysphoric mood and sleep disturbance. The patient is nervous/anxious.             Objective:     Vitals:    05/09/17 1056   BP: 110/72   Pulse: 81   Temp: 98.4 ??F (36.9 ??C)   TempSrc: Oral   SpO2: 98%   Weight: 192 lb 3.2 oz (87.2 kg)     There is no height or weight on file to calculate BMI.    Physical Exam   Constitutional: She is oriented to person, place, and time. She appears well-developed and well-nourished.   Cardiovascular:  Normal rate, regular rhythm, normal heart sounds and intact distal pulses.    No murmur heard.  Pulmonary/Chest: Effort normal and breath sounds normal.   Neurological: She is alert and oriented to person, place, and time.   Psychiatric: She has a normal mood and affect. Her behavior is normal. Judgment and thought content normal.     Lab Results   Component Value Date    WBC 5.7 12/07/2015    HGB 12.5 12/07/2015    HCT 38.8 12/07/2015    MCV 94.8 12/07/2015    PLT 208 12/07/2015     Lab Results   Component Value Date    HGBA1C 5.1 12/07/2015     Lab Results   Component Value Date    CHOLTOT 162 12/07/2015    TRIG 63 12/07/2015    HDL 57 (L) 12/07/2015    LDL 92 12/07/2015     Lab Results   Component Value Date    TSH 1.99 12/07/2015     Lab Results   Component Value Date    VITD25H 17.5 (L) 12/07/2015     Lab Results   Component Value Date    GLUCOSE 94 12/07/2015    BUN 9 12/07/2015    CO2 29 12/07/2015    CREATININE 0.67 12/07/2015    K 4.3 12/07/2015    NA 142 12/07/2015    CL 108 12/07/2015    CALCIUM 8.6 12/07/2015    ALBUMIN 3.6 12/07/2015    PROT 5.7 (L) 12/07/2015    ALKPHOS 51 12/07/2015    ALT 8 12/07/2015    AST 12 (L) 12/07/2015    BILITOT 0.4 12/07/2015            Assessment/Plan:     Anxiety    Controlled Substance Monitoring 02/07/2015 12/01/2015   OARRS/eKASPER Status Reviewed Reviewed   OARRS/eKASPER Consistent with Prescriber Expectation Y Y     I have completed the required OARRS/eKASPER documentation for this patient on 05/14/2017.  [name]  Refilled rx  vaginitis affirm test ordered     Total time spent with pt of  which more than 50%was spent counselling for anxietysee pt instrutions    The patient understands his/her medication(s).  Side effects/barriers of these medications were addressed.  Over-the-counter medications were also addressed during this encounter.                 Veatrice Kells, MD

## 2017-05-09 NOTE — Unmapped (Signed)
Pt states she was supposed to get a refill of her Xanax during her appointment but she never received it.

## 2017-06-07 MED ORDER — sulfamethoxazole-trimethoprim (BACTRIM DS) 800-160 mg per tablet
800-160 | ORAL_TABLET | Freq: Two times a day (BID) | ORAL | 0 refills | Status: AC
Start: 2017-06-07 — End: 2017-07-04

## 2017-07-03 MED ORDER — cyclobenzaprine (FLEXERIL) 5 MG tablet
5 | ORAL_TABLET | ORAL | 2 refills | Status: AC
Start: 2017-07-03 — End: 2017-10-10

## 2017-07-04 MED ORDER — sulfamethoxazole-trimethoprim (BACTRIM DS) 800-160 mg per tablet
800-160 | ORAL_TABLET | Freq: Two times a day (BID) | ORAL | 0 refills | Status: AC
Start: 2017-07-04 — End: 2017-09-12

## 2017-07-10 MED ORDER — AMOXicillin-clavulanate (AUGMENTIN) 875-125 mg per tablet
875-125 | ORAL_TABLET | Freq: Two times a day (BID) | ORAL | 0 refills | Status: AC
Start: 2017-07-10 — End: 2017-07-17

## 2017-07-10 NOTE — Unmapped (Signed)
Addended byMelissa Montane on: 07/10/2017 06:35 PM     Modules accepted: Orders

## 2017-09-12 ENCOUNTER — Ambulatory Visit: Admit: 2017-09-12 | Discharge: 2017-09-17 | Payer: PRIVATE HEALTH INSURANCE

## 2017-09-12 ENCOUNTER — Ambulatory Visit: Admit: 2017-09-13 | Payer: PRIVATE HEALTH INSURANCE

## 2017-09-12 DIAGNOSIS — N3000 Acute cystitis without hematuria: Secondary | ICD-10-CM

## 2017-09-12 LAB — POCT URINALYSIS DIPSTICK, NONAUTOMATED; W/MICRO
POCT - Blood, UA: NEGATIVE
POCT - Glucose, UA: NEGATIVE
POCT - Nitrite, UA: NEGATIVE
POCT - Specific Gravity, Urine: 1.015
POCT - Urobilinogen, UA: 0.2
POCT - pH, UA: 5

## 2017-09-12 LAB — URINE CULTURE, OUTPATIENT

## 2017-09-12 MED ORDER — naproxen (NAPROSYN) 500 MG tablet
500 | ORAL_TABLET | ORAL | 0 refills | Status: AC
Start: 2017-09-12 — End: 2017-10-07

## 2017-09-12 MED ORDER — sulfamethoxazole-trimethoprim (BACTRIM,SEPTRA) 400-80 mg per tablet
400-80 | ORAL_TABLET | Freq: Every day | ORAL | 0 refills | Status: AC | PRN
Start: 2017-09-12 — End: 2017-10-31

## 2017-09-12 MED ORDER — ALPRAZolam (XANAX) 0.25 MG tablet
0.25 | ORAL_TABLET | Freq: Every day | ORAL | 0 refills | Status: AC | PRN
Start: 2017-09-12 — End: 2017-11-07

## 2017-09-12 NOTE — Unmapped (Signed)
09/12/2017 Sherry Erie, MD

## 2017-09-12 NOTE — Progress Notes (Signed)
[facility]  [facility]  [address]  [place] [address]    Name:  MARKEETA HOLTZER Date of Birth: Jun 08, 1966 (51 y.o.)   MRN: [id number]    Date of Service:  09/12/2017     Subjective:     Chief Complaint   Patient presents with    Medication Refill    Urinary Tract Infection     urinating frequently, burning with urination     History of Present Illness:  Sherry Compton is a(n) 51 y.o. female here today for the following:   HPI  Chief Complaint   Patient presents with    Medication Refill    Urinary Tract Infection     urinating frequently, burning with urination     utis after sexual intercourse    Starts immediately  Dysuria   frequency    States had 4 utis in last 1 yr     also c/o occ anxiety would liek few xanax    Past Medical History:   Diagnosis Date    Anxiety      Past Surgical History:   Procedure Laterality Date    AUGMENTATION BREAST ENDOSCOPIC      ENDOMETRIAL ABLATION      gastric sleeve bariatric surgeyr by dr Clementeen Graham       Social History     Social History    Marital status: Married     Spouse name: N/A    Number of children: N/A    Years of education: N/A     Occupational History    Not on file.     Social History Main Topics    Smoking status: Former Smoker    Smokeless tobacco: Never Used    Alcohol use No    Drug use: No    Sexual activity: Yes     Partners: Male     Other Topics Concern    Caffeine Use Yes    Exercise Yes     2 -3 times a week    Seat Belt Yes     Social History Narrative    No narrative on file     Family History   Problem Relation Age of Onset    Migraines Sister     Diabetes Paternal Grandmother     Heart disease Paternal Grandmother     Miscarriages / Stillbirths Paternal Grandmother        Current Outpatient Prescriptions   Medication Sig Dispense Refill    ALPRAZolam (XANAX) 0.25 MG tablet Take 1 tablet (0.25 mg total) by mouth daily as needed for Sleep or Anxiety. 30 tablet 0    cyclobenzaprine  (FLEXERIL) 5 MG tablet TAKE 1 TABLET(5 MG) BY MOUTH AT BEDTIME 30 tablet 2    ergocalciferol (ERGOCALCIFEROL) 50,000 unit capsule TAKE 1 CAPSULE BY MOUTH 1 TIME A WEEK 12 capsule 0    naproxen (NAPROSYN) 500 MG tablet Take 1 tablet (500 mg total) by mouth 2 times a day with meals. As needed 60 tablet 0    valACYclovir (VALTREX) 1000 MG tablet Take 1 tablet (1,000 mg total) by mouth 2 times a day. For 3 days (Patient taking differently: Take 1,000 mg by mouth 2 times a day. For 3 days as needed   ) 30 tablet 2    ZOLMitriptan (ZOMIG) 2.5 MG tablet Take 2.5 mg by mouth once as needed for Migraine.       No current facility-administered medications for  this visit.       Review of Systems   Genitourinary: Negative for decreased urine volume, difficulty urinating, dysuria, hematuria and urgency.   Psychiatric/Behavioral: The patient is nervous/anxious.             Objective:     Vitals:    09/12/17 0916   BP: 134/78   Pulse: 84   Temp: 98.3 F (36.8 C)   SpO2: 99%   Weight: 201 lb 12.8 oz (91.5 kg)   Height: 5' 9 (1.753 m)     Body mass index is 29.8 kg/m.    Physical Exam   Constitutional: She is oriented to person, place, and time. She appears well-developed and well-nourished.   Cardiovascular: Normal rate, regular rhythm, normal heart sounds and intact distal pulses.    No murmur heard.  Pulmonary/Chest: Effort normal and breath sounds normal.   Neurological: She is alert and oriented to person, place, and time. She has normal reflexes.   Psychiatric: She has a normal mood and affect. Her behavior is normal. Judgment and thought content normal.     Lab Results   Component Value Date    WBC 5.7 12/07/2015    HGB 12.5 12/07/2015    HCT 38.8 12/07/2015    MCV 94.8 12/07/2015    PLT 208 12/07/2015     Lab Results   Component Value Date    HGBA1C 5.1 12/07/2015     Lab Results   Component Value Date    CHOLTOT 162 12/07/2015    TRIG 63 12/07/2015    HDL 57 (L) 12/07/2015    LDL 92 12/07/2015     Lab Results    Component Value Date    TSH 1.99 12/07/2015     Lab Results   Component Value Date    VITD25H 17.5 (L) 12/07/2015     Lab Results   Component Value Date    GLUCOSE 94 12/07/2015    BUN 9 12/07/2015    CO2 29 12/07/2015    CREATININE 0.67 12/07/2015    K 4.3 12/07/2015    NA 142 12/07/2015    CL 108 12/07/2015    CALCIUM 8.6 12/07/2015    ALBUMIN 3.6 12/07/2015    PROT 5.7 (L) 12/07/2015    ALKPHOS 51 12/07/2015    ALT 8 12/07/2015    AST 12 (L) 12/07/2015    BILITOT 0.4 12/07/2015            Assessment/Plan:        uti s after intercourse  Bactrim after intercourse for prophylaxis     anxiety refilled xanax       Controlled Substance Monitoring 02/07/2015 12/01/2015 05/14/2017 09/12/2017   OARRS/eKASPER Status Reviewed Reviewed Reviewed Reviewed   OARRS/eKASPER Exclusion List (if applicable) - - - The drug is prescribed or personally furnished for less than a 7-day supply   OARRS/eKASPER Consistent with Prescriber Expectation Jerrel Ivory -   UDS Consistent with Prescriber Expectation - - Y -     I have completed the required OARRS/eKASPER documentation for this patient on 09/17/2017.  Ariyanah Aguado S Nikolai Wilczak                   Veatrice Kells, MD

## 2017-10-07 MED ORDER — naproxen (NAPROSYN) 500 MG tablet
500 | ORAL_TABLET | ORAL | 0 refills | Status: AC
Start: 2017-10-07 — End: 2017-11-25

## 2017-10-10 MED ORDER — cyclobenzaprine (FLEXERIL) 5 MG tablet
5 | ORAL_TABLET | ORAL | 0 refills | Status: AC
Start: 2017-10-10 — End: 2017-11-28

## 2017-10-18 NOTE — Unmapped (Signed)
Sherry Compton 10/17/2017 7:30 PM EDT    Please note this message is being forwarded to you from My Chart Advice/Customer Service Queue.    Are you able to provide the patient with diagnosis for her medications?    ----- Message -----  From: Sherry Compton  Sent: 10/17/2017 3:23 PM  To: Sherry Compton, Sherry Compton  Subject: FW: Need diagnosis on my ?[facility] medica*        ----- Message -----  From: Sherry Compton  Sent: 10/16/2017 5:02 PM  To: Patient Billing Crm  Subject: Need diagnosis on my ?[facility] medical re*    Topic: Bill or Statement    My [facility] medical records have a record of my medications, etc. but does not have the diagnosis for what the medication is for. I need to share my medical records with a specialist doctor but need my diagnosis listed. Will you please add onto my records the diagnosis that goes with the medication. Specifically, chronic back and joint pain which is what the Naproxen is for; migraine headaches for Zomig; anxiety for the Alprazolom; and muscle pain for the Cyclobenzaprine. Please send a message when you have completed or let me know if you need any more info. Thank you.   [employer]

## 2017-11-07 ENCOUNTER — Ambulatory Visit: Admit: 2017-11-07 | Payer: PRIVATE HEALTH INSURANCE

## 2017-11-07 DIAGNOSIS — Z0000 Encounter for general adult medical examination without abnormal findings: Secondary | ICD-10-CM

## 2017-11-07 MED ORDER — ALPRAZolam (XANAX) 0.25 MG tablet
0.25 | ORAL_TABLET | Freq: Every day | ORAL | 0 refills | Status: AC | PRN
Start: 2017-11-07 — End: 2018-09-04

## 2017-11-07 NOTE — Unmapped (Signed)
Your goals based on today's visit are:(BRING THIS TO YOUR NEXT APPOINTMENT)   1. improve your diet by: healthy diet  2. increase your exercise by: aerobic 30 min 3 times a week  3. Blood Pressure: <140/90   4. Cholesterol: Triglycerides <200, HDL >50, LDL <130   5. Other: Flushot in the fall   6. Your BMI is Body mass index is 31 kg/m??. so you should try to lose 30 pounds over the next year to reach your ideal body weight  7. Annual eye exam  8. Cancer screenings: Pap smears for women 21-65, mammograms for women 50-75, and colonoscopy : for patients over 63 years of age        Follow up appointment:  6-72mths    You can find a lot of good health information online at [email or web address], click on conditions and treatment at the top of the page or in the about [place] section there are some excellent links (to the ADA etc)     You may get a call or mailing about how satisfied you were with the care you received today. Please do take the time to give [place] your input. It will help [place] to improve the care you and others receive in the future. We thank you in advance for doing this!

## 2017-11-07 NOTE — Unmapped (Deleted)
p 

## 2017-11-07 NOTE — Unmapped (Signed)
[facility]  [facility] PRIMARY [facility]ARE AT [facility]  [address]  [place] [address]    Name:  Sherry Compton Date of Birth: 12/07/1966 (51 y.o.)   MRN: [id number]    Date of Service:  11/07/2017     Subjective:     Chief Complaint   Patient presents with   ??? Annual Exam     History of Present Illness:  Sherry Compton is a(n) 51 y.o. female here today for the following:   HPI  Chief Complaint   Patient presents with   ??? Annual Exam   Patient is a 51 y/o female with a history of myalgia presents to clinic for her annual wellness check. Patient states that she is doing well and has no complaints. She denies any chest pain, fatigue, abdominal pain, numbness or tingling, and changes in bowel or bladder at this time. Her migraines have been well-controlled by taking fexeril as needed, as her last migraine was three weeks ago. She notes that her lower back and knee pain has been well-controlled naproxen and muscle relaxer. She does confirm having trouble sleeping of which she attributes to life problems and finds it not to be to concerning. She expressed that she would like to loose weight by beginning an exercise regime to walk for a day twice a week, as she has gained ~20lbs over the past 4 months. She denies any recent episodes or current feelings of anxiety or depression at this time.     Past Medical History:   Diagnosis Date   ??? Anxiety      Past Surgical History:   Procedure Laterality Date   ??? AUGMENTATION BREAST ENDOSCOPIC     ??? ENDOMETRIAL ABLATION     ??? gastric sleeve bariatric surgeyr by dr Clementeen Graham       Social History     Social History   ??? Marital status: Married     Spouse name: N/A   ??? Number of children: N/A   ??? Years of education: N/A     Occupational History   ??? Not on file.     Social History Main Topics   ??? Smoking status: Former Smoker   ??? Smokeless tobacco: Never Used   ??? Alcohol use No   ??? Drug use: No   ??? Sexual activity: Yes     Partners: Male     Other Topics  Concern   ??? Caffeine Use Yes   ??? Exercise Yes     2 -3 times a week   ??? Seat Belt Yes     Social History Narrative   ??? No narrative on file     Family History   Problem Relation Age of Onset   ??? Migraines Sister    ??? Diabetes Paternal Grandmother    ??? Heart disease Paternal Grandmother    ??? Miscarriages / Stillbirths Paternal Grandmother          Current Outpatient Prescriptions   Medication Sig Dispense Refill   ??? ALPRAZolam (XANAX) 0.25 MG tablet Take 1 tablet (0.25 mg total) by mouth daily as needed for Sleep or Anxiety. 30 tablet 0   ??? cyclobenzaprine (FLEXERIL) 5 MG tablet TAKE 1 TABLET(5 MG) BY MOUTH AT BEDTIME 30 tablet 0   ??? ergocalciferol (ERGOCALCIFEROL) 50,000 unit capsule TAKE 1 CAPSULE BY MOUTH 1 TIME A WEEK 12 capsule 0   ??? naproxen (NAPROSYN) 500 MG tablet Take 1 tablet (500 mg  total) by mouth 2 times a day with meals. As needed 60 tablet 0   ??? naproxen (NAPROSYN) 500 MG tablet TAKE 1 TABLET(500 MG) BY MOUTH TWICE DAILY WITH MEALS 60 tablet 0   ??? valACYclovir (VALTREX) 1000 MG tablet Take 1 tablet (1,000 mg total) by mouth 2 times a day. For 3 days (Patient taking differently: Take 1,000 mg by mouth 2 times a day. For 3 days as needed   ) 30 tablet 2   ??? ZOLMitriptan (ZOMIG) 2.5 MG tablet Take 2.5 mg by mouth once as needed for Migraine.       No current facility-administered medications for this visit.       Review of Systems   Constitutional: Negative for appetite change, fatigue and fever.   HENT: Negative for congestion, ear pain, postnasal drip, rhinorrhea, sinus pressure, sore throat, trouble swallowing and voice change.    Eyes: Negative for photophobia, pain, discharge, redness and visual disturbance.   Respiratory: Negative for cough, chest tightness, shortness of breath and wheezing.    Cardiovascular: Negative for chest pain, palpitations and leg swelling.   Gastrointestinal: Negative for abdominal pain, bloating, blood in stool, constipation, diarrhea, heartburn, nausea and vomiting.    Genitourinary: Negative for decreased urine volume, difficulty urinating, dysuria, frequency, hematuria, pelvic pain, urgency, vaginal bleeding, vaginal discharge and vaginal pain.   Musculoskeletal: Negative for arthralgias, back pain, joint swelling, myalgias, neck pain and neck stiffness.   Skin: Negative for rash.   Neurological: Negative for dizziness, tremors, speech difficulty, weakness, light-headedness, numbness and headaches.   Hematological: Negative for adenopathy. Does not bruise/bleed easily.   Psychiatric/Behavioral: Negative for agitation, behavioral problems, confusion, decreased concentration, depression, dysphoric mood and sleep disturbance. The patient is not nervous/anxious and is not hyperactive.             Objective:     Vitals:    11/07/17 1305   BP: 124/86   Pulse: 87   Temp: 98.1 ??F (36.7 ??C)   SpO2: 98%   Weight: 209 lb 14.4 oz (95.2 kg)   Height: 5' 9 (1.753 m)     Body mass index is 31 kg/m??.    Physical Exam   Vitals reviewed.  Constitutional: She is oriented to person, place, and time. She appears well-developed and well-nourished. No distress.   HENT:   Head: Normocephalic and atraumatic.   Right Ear: External ear normal.   Left Ear: External ear normal.   Nose: Nose normal.   Mouth/Throat: Oropharynx is clear and moist. No oropharyngeal exudate.   Eyes: Pupils are equal, round, and reactive to light. Conjunctivae and EOM are normal. No scleral icterus.   Neck: Normal range of motion. Neck supple. No thyromegaly present.   Cardiovascular: Normal rate, regular rhythm, normal heart sounds and intact distal pulses.    No murmur heard.  Pulmonary/Chest: Effort normal and breath sounds normal. She has no wheezes.   Abdominal: Soft. Bowel sounds are normal. She exhibits no distension and no mass. There is no tenderness. There is no rebound and no guarding.   Musculoskeletal: Normal range of motion. She exhibits no edema or tenderness.   Lymphadenopathy:     She has no cervical  adenopathy.   Neurological: She is alert and oriented to person, place, and time. She has normal reflexes. She displays normal reflexes. No cranial nerve deficit. She exhibits normal muscle tone. Coordination normal.   Skin: Skin is warm. She is not diaphoretic.   Psychiatric: She has a normal mood  and affect. Her behavior is normal. Judgment and thought content normal.     Lab Results   Component Value Date    WBC 5.7 12/07/2015    HGB 12.5 12/07/2015    HCT 38.8 12/07/2015    MCV 94.8 12/07/2015    PLT 208 12/07/2015     Lab Results   Component Value Date    HGBA1C 5.1 12/07/2015     Lab Results   Component Value Date    CHOLTOT 162 12/07/2015    TRIG 63 12/07/2015    HDL 57 (L) 12/07/2015    LDL 92 12/07/2015     Lab Results   Component Value Date    TSH 1.99 12/07/2015     Lab Results   Component Value Date    VITD25H 17.5 (L) 12/07/2015     Lab Results   Component Value Date    GLUCOSE 94 12/07/2015    BUN 9 12/07/2015    CO2 29 12/07/2015    CREATININE 0.67 12/07/2015    K 4.3 12/07/2015    NA 142 12/07/2015    CL 108 12/07/2015    CALCIUM 8.6 12/07/2015    ALBUMIN 3.6 12/07/2015    PROT 5.7 (L) 12/07/2015    ALKPHOS 51 12/07/2015    ALT 8 12/07/2015    AST 12 (L) 12/07/2015    BILITOT 0.4 12/07/2015            Assessment/Plan:     Well exam    Health Maintenance Summary                Colon Cancer Screening / Stool Testing Overdue 05/25/2016     Immunization: Influenza Overdue 09/23/2017     Comprehensive Physical Exam Next Due 11/01/2017     Depression Screening Next Due 11/07/2018     Diabetes Screening Next Due 12/06/2018     Lipid Panel Next Due 12/06/2020     Cervical Cancer Screening/Pap Smear (MyChart) Next Due 01/04/2021     Immunization: DTaP/Tdap/Td Next Due 11/01/2026         cologuard recommended  Anxiety prn use of benzo for situational anxiety    Advised pt to reduce use of it    Controlled Substance Monitoring 02/07/2015 12/01/2015 05/14/2017 09/12/2017   OARRS/eKASPER Status Reviewed Reviewed Reviewed  Reviewed   OARRS/eKASPER Exclusion List (if applicable) - - - The drug is prescribed or personally furnished for less than a 7-day supply   OARRS/eKASPER Consistent with Prescriber Expectation Jerrel Ivory -   UDS Consistent with Prescriber Expectation - - Y -     I have completed the required OARRS/eKASPER documentation for this patient on 11/11/2017.  Michael Ventresca S Ailen Strauch     musculoskeletal pain controlled on meds    See pt instructions    The patient understands his/her medication(s).  Side effects/barriers of these medications were addressed.  Over-the-counter medications were also addressed during this encounter.                 Veatrice Kells, MD

## 2017-11-25 MED ORDER — naproxen (NAPROSYN) 500 MG tablet
500 | ORAL_TABLET | ORAL | 0 refills | Status: AC
Start: 2017-11-25 — End: 2018-01-18

## 2017-11-28 MED ORDER — cyclobenzaprine (FLEXERIL) 5 MG tablet
5 | ORAL_TABLET | ORAL | 0 refills | Status: AC
Start: 2017-11-28 — End: 2017-12-30

## 2017-12-13 MED ORDER — valACYclovir (VALTREX) 1000 MG tablet
1000 | ORAL_TABLET | ORAL | 11 refills | Status: AC
Start: 2017-12-13 — End: 2018-09-04

## 2017-12-14 MED ORDER — AMOXicillin (AMOXIL) 875 MG tablet
875 | ORAL_TABLET | Freq: Two times a day (BID) | ORAL | 0 refills | Status: AC
Start: 2017-12-14 — End: 2019-08-11

## 2017-12-30 MED ORDER — cyclobenzaprine (FLEXERIL) 5 MG tablet
5 | ORAL_TABLET | ORAL | 0 refills | Status: AC
Start: 2017-12-30 — End: 2018-01-28

## 2018-01-20 MED ORDER — naproxen (NAPROSYN) 500 MG tablet
500 | ORAL_TABLET | ORAL | 0 refills | Status: AC
Start: 2018-01-20 — End: 2018-02-16

## 2018-01-28 MED ORDER — cyclobenzaprine (FLEXERIL) 5 MG tablet
5 | ORAL_TABLET | ORAL | 0 refills | Status: AC
Start: 2018-01-28 — End: 2018-03-01

## 2018-02-17 MED ORDER — naproxen (NAPROSYN) 500 MG tablet
500 | ORAL_TABLET | ORAL | 0 refills | Status: AC
Start: 2018-02-17 — End: 2018-03-22

## 2018-03-03 MED ORDER — cyclobenzaprine (FLEXERIL) 5 MG tablet
5 | ORAL_TABLET | ORAL | 0 refills | Status: AC
Start: 2018-03-03 — End: 2018-07-09

## 2018-03-24 MED ORDER — naproxen (NAPROSYN) 500 MG tablet
500 | ORAL_TABLET | ORAL | 0 refills | Status: AC
Start: 2018-03-24 — End: 2018-04-22

## 2018-04-22 MED ORDER — naproxen (NAPROSYN) 500 MG tablet
500 | ORAL_TABLET | ORAL | 0 refills | Status: AC
Start: 2018-04-22 — End: 2018-06-04

## 2018-06-04 MED ORDER — naproxen (NAPROSYN) 500 MG tablet
500 | ORAL_TABLET | ORAL | 0 refills | Status: AC
Start: 2018-06-04 — End: 2018-08-08

## 2018-07-09 MED ORDER — cyclobenzaprine (FLEXERIL) 5 MG tablet
5 | ORAL_TABLET | ORAL | 0 refills | Status: AC
Start: 2018-07-09 — End: 2018-08-14

## 2018-07-10 NOTE — Telephone Encounter (Signed)
Left msg for pt to call back and make appt with Dr August Saucer for Xanax rx

## 2018-07-15 NOTE — Telephone Encounter (Signed)
Left msg for pt again to let her know she needs to be seen for this rx refill

## 2018-08-10 MED ORDER — naproxen (NAPROSYN) 500 MG tablet
500 | ORAL_TABLET | ORAL | 0 refills | Status: AC
Start: 2018-08-10 — End: 2018-09-06

## 2018-08-14 MED ORDER — cyclobenzaprine (FLEXERIL) 5 MG tablet
5 | ORAL_TABLET | ORAL | 0 refills | Status: AC
Start: 2018-08-14 — End: 2018-12-05

## 2018-09-04 ENCOUNTER — Ambulatory Visit: Admit: 2018-09-04 | Payer: PRIVATE HEALTH INSURANCE

## 2018-09-04 ENCOUNTER — Ambulatory Visit: Payer: PRIVATE HEALTH INSURANCE

## 2018-09-04 DIAGNOSIS — F419 Anxiety disorder, unspecified: Secondary | ICD-10-CM

## 2018-09-04 MED ORDER — valACYclovir (VALTREX) 1000 MG tablet
1000 | ORAL_TABLET | Freq: Two times a day (BID) | ORAL | 2 refills | Status: AC
Start: 2018-09-04 — End: 2019-10-01

## 2018-09-04 MED ORDER — ALPRAZolam (XANAX) 0.25 MG tablet
0.25 | ORAL_TABLET | Freq: Every day | ORAL | 0 refills | Status: AC | PRN
Start: 2018-09-04 — End: 2019-08-11

## 2018-09-04 NOTE — Progress Notes (Signed)
[facility]  [facility]  [address]  [place] [address]    The patient's chart was reviewed prior to the CHRONIC PROBLEM(S) VISIT as part of pre-visit planning    Problems:fever blisters, flight anxiety    Compliance w/ meds:+  Complains of: gets fever blisters every few months, 3d v valtrex usu works well   Xanax prn for travel anxiety    Past Medical History:   Diagnosis Date    Anxiety           Current Outpatient Prescriptions   Medication Sig Dispense Refill    ALPRAZolam (XANAX) 0.25 MG tablet Take 1 tablet (0.25 mg total) by mouth daily as needed for Sleep or Anxiety. 30 tablet 0    valACYclovir (VALTREX) 1000 MG tablet Take 1 tablet (1,000 mg total) by mouth 2 times a day. For 3 days as needed 30 tablet 2    AMOXicillin (AMOXIL) 875 MG tablet Take 1 tablet (875 mg total) by mouth 2 times a day. 14 tablet 0    cyclobenzaprine (FLEXERIL) 5 MG tablet TAKE 1 TABLET(5 MG) BY MOUTH AT BEDTIME 30 tablet 0    ergocalciferol (ERGOCALCIFEROL) 50,000 unit capsule TAKE 1 CAPSULE BY MOUTH 1 TIME A WEEK 12 capsule 0    naproxen (NAPROSYN) 500 MG tablet Take 1 tablet (500 mg total) by mouth 2 times a day with meals. As needed 60 tablet 0    naproxen (NAPROSYN) 500 MG tablet TAKE 1 TABLET(500 MG) BY MOUTH TWICE DAILY WITH MEALS 60 tablet 0    ZOLMitriptan (ZOMIG) 2.5 MG tablet Take 2.5 mg by mouth once as needed for Migraine.       No current facility-administered medications for this visit.        Review of systems:  As above    On Exam:  Vitals:    09/04/18 1121   BP: 132/77   Pulse: 65       Wt Readings from Last 3 Encounters:   09/04/18 218 lb (98.9 kg)   11/07/17 209 lb 14.4 oz (95.2 kg)   09/12/17 201 lb 12.8 oz (91.5 kg)       NAD  Psych: A&Ox3; reactive affect; no obv thought d/o     Lab Results   Component Value Date    HGBA1C 5.1 12/07/2015    HGBA1C 4.8 10/22/2012     Lab Results   Component Value Date    CREATININE 0.67 12/07/2015       Lab Results    Component Value Date    GLUCOSE 94 12/07/2015    BUN 9 12/07/2015    CO2 29 12/07/2015    CREATININE 0.67 12/07/2015    K 4.3 12/07/2015    NA 142 12/07/2015    CL 108 12/07/2015    CALCIUM 8.6 12/07/2015    ALBUMIN 3.6 12/07/2015    PROT 5.7 (L) 12/07/2015    ALKPHOS 51 12/07/2015    ALT 8 12/07/2015    AST 12 (L) 12/07/2015    BILITOT 0.4 12/07/2015       Lab Results   Component Value Date    CHOLTOT 162 12/07/2015    TRIG 63 12/07/2015    HDL 57 (L) 12/07/2015    LDL 92 12/07/2015       Health Maintenance Summary                Colon Cancer Screening / Stool Testing Overdue 05/25/2016  Immunization: Influenza Next Due 09/23/2018     Depression Screening Next Due 11/07/2018     Comprehensive Physical Exam Next Due 11/07/2018     Diabetes Screening Next Due 12/06/2018     Lipid Panel Next Due 12/06/2020     Cervical Cancer Screening/Pap Smear (MyChart) Next Due 01/04/2021     Immunization: DTaP/Tdap/Td Next Due 11/01/2026            Immunization History   Administered Date(s) Administered    tdap 11/01/2016         Body mass index is 32.19 kg/m.  Prediabetes referral: na     Time in: 1115  Time out:1125  10 mins, > 50% time spent in face to face counseling/coordinating of care: see A&P below for details    A/P:  1. Fever blisters: needs RF for prn valtrex, works well w 3d dosing  2. Flight anxiety: prn xanax  Controlled Substance Monitoring 02/07/2015 12/01/2015 05/14/2017 09/12/2017 11/11/2017 09/04/2018   OARRS/eKASPER Status Reviewed Reviewed Reviewed Reviewed Reviewed Reviewed   OARRS/eKASPER Exclusion List (if applicable) - - - The drug is prescribed or personally furnished for less than a 7-day supply - -   OARRS/eKASPER Consistent with Prescriber Expectation Hazel Sams   UDS Consistent with Prescriber Expectation - - Y - Y -     I have completed the required OARRS/eKASPER documentation for this patient on 09/04/2018.  Apphia Cropley K Masiya Claassen     RTC:No Follow-up on file.   New meds/Patient education given  : see  AVS  Patient verbalized understanding & agreement of the proposed plan.

## 2018-09-04 NOTE — Unmapped (Signed)
Health Maintenance Summary                Colon Cancer Screening / Stool Testing Overdue 05/25/2016     Immunization: Influenza Next Due 09/23/2018     Depression Screening Next Due 11/07/2018     Comprehensive Physical Exam Next Due 11/07/2018     Diabetes Screening Next Due 12/06/2018     Lipid Panel Next Due 12/06/2020     Cervical Cancer Screening/Pap Smear (MyChart) Next Due 01/04/2021     Immunization: DTaP/Tdap/Td Next Due 11/01/2026

## 2018-09-06 MED ORDER — naproxen (NAPROSYN) 500 MG tablet
500 | ORAL_TABLET | ORAL | 0 refills | Status: AC
Start: 2018-09-06 — End: 2018-10-12

## 2018-10-14 MED ORDER — naproxen (NAPROSYN) 500 MG tablet
500 | ORAL_TABLET | ORAL | 0 refills | Status: AC
Start: 2018-10-14 — End: 2018-11-14

## 2018-11-15 MED ORDER — naproxen (NAPROSYN) 500 MG tablet
500 | ORAL_TABLET | ORAL | 0 refills | Status: AC
Start: 2018-11-15 — End: 2018-12-12

## 2018-12-06 MED ORDER — cyclobenzaprine (FLEXERIL) 5 MG tablet
5 | ORAL_TABLET | ORAL | 0 refills | Status: AC
Start: 2018-12-06 — End: 2019-07-20

## 2018-12-12 MED ORDER — naproxen (NAPROSYN) 500 MG tablet
500 | ORAL_TABLET | ORAL | 0 refills | Status: AC
Start: 2018-12-12 — End: 2019-01-06

## 2019-01-06 MED ORDER — naproxen (NAPROSYN) 500 MG tablet
500 | ORAL_TABLET | ORAL | 0 refills | Status: AC
Start: 2019-01-06 — End: 2019-08-11

## 2019-07-20 MED ORDER — cyclobenzaprine (FLEXERIL) 5 MG tablet
5 | ORAL_TABLET | ORAL | 0 refills | Status: AC
Start: 2019-07-20 — End: 2019-10-07

## 2019-07-28 MED ORDER — naproxen (NAPROSYN) 500 MG tablet
500 | ORAL_TABLET | Freq: Two times a day (BID) | ORAL | 0 refills | Status: AC
Start: 2019-07-28 — End: 2019-10-07

## 2019-08-11 ENCOUNTER — Ambulatory Visit: Admit: 2019-08-11 | Payer: PRIVATE HEALTH INSURANCE

## 2019-08-11 DIAGNOSIS — Z0000 Encounter for general adult medical examination without abnormal findings: Secondary | ICD-10-CM

## 2019-08-11 MED ORDER — ALPRAZolam (XANAX) 0.25 MG tablet
0.25 | ORAL_TABLET | Freq: Every day | ORAL | 0 refills | Status: AC | PRN
Start: 2019-08-11 — End: 2020-12-14

## 2019-08-11 MED ORDER — sulfamethoxazole-trimethoprim (BACTRIM DS) 800-160 mg per tablet
800-160 | ORAL_TABLET | Freq: Every day | ORAL | 0 refills | Status: AC | PRN
Start: 2019-08-11 — End: 2019-10-07

## 2019-08-11 MED ORDER — ZOLMitriptan (ZOMIG) 2.5 MG tablet
2.5 | ORAL_TABLET | Freq: Once | ORAL | 0 refills | 28.00000 days | Status: AC | PRN
Start: 2019-08-11 — End: 2019-08-11

## 2019-08-11 NOTE — Unmapped (Signed)
[facility]  [facility] AT [facility] MEDICAL OFFICE  [address]  [place] [address]    Name:  Sherry Compton Date of Birth: Oct 13, 1966 (53 y.o.)   MRN: [id number]    Date of Service:  08/11/2019    Subjective   HPI:     Chief Complaint   Patient presents with   ??? Annual Exam     History of Present Illness:  Sherry Compton is a(n) 53 y.o. female here today for the following:   HPI     Chief Complaint   Patient presents with   ??? Annual Exam     Diet good    Exercise walking and riding bikes    Taking alprazolam seldom for anxiety and travel issues    Takes  One bactrim post intercourse to prevent uti  For occ back pain takes naproxen and flexeril prn  Past Medical History:   Diagnosis Date   ??? Anxiety      Past Surgical History:   Procedure Laterality Date   ??? AUGMENTATION BREAST ENDOSCOPIC     ??? ENDOMETRIAL ABLATION     ??? gastric sleeve bariatric surgeyr by dr Clementeen Graham       Social History     Socioeconomic History   ??? Marital status: Married     Spouse name: Not on file   ??? Number of children: Not on file   ??? Years of education: Not on file   ??? Highest education level: Not on file   Occupational History   ??? Not on file   Social Needs   ??? Financial resource strain: Not on file   ??? Food insecurity     Worry: Not on file     Inability: Not on file   ??? Transportation needs     Medical: Not on file     Non-medical: Not on file   Tobacco Use   ??? Smoking status: Former Smoker   ??? Smokeless tobacco: Never Used   Substance and Sexual Activity   ??? Alcohol use: No   ??? Drug use: No   ??? Sexual activity: Yes     Partners: Male   Lifestyle   ??? Physical activity     Days per week: Not on file     Minutes per session: Not on file   ??? Stress: Not on file   Relationships   ??? Social Wellsite geologist on phone: Not on file     Gets together: Not on file     Attends religious service: Not on file     Active member of club or organization: Not on file     Attends meetings of clubs or organizations:  Not on file     Relationship status: Not on file   ??? Intimate partner violence     Fear of current or ex partner: Not on file     Emotionally abused: Not on file     Physically abused: Not on file     Forced sexual activity: Not on file   Other Topics Concern   ??? Caffeine Use Yes   ??? Occupational Exposure Not Asked   ??? Exercise Yes     Comment: 2 -3 times a week riding bikes and walking   ??? Seat Belt Yes   Social History Narrative   ??? Not on file     Family History   Problem Relation Age  of Onset   ??? Heart disease Mother         mi at age high 77   ??? Migraines Sister    ??? Diabetes Paternal Grandmother    ??? Heart disease Paternal Grandmother    ??? Miscarriages / Stillbirths Paternal Grandmother           ROS:   Review of Systems   Constitutional: Negative for activity change, appetite change, fatigue and fever.   HENT: Negative for congestion, ear pain, sinus pressure, sore throat, trouble swallowing and voice change.    Eyes: Negative for photophobia, pain, discharge, redness and visual disturbance.   Respiratory: Negative for cough, chest tightness, shortness of breath and wheezing.    Cardiovascular: Negative for chest pain, palpitations and leg swelling.   Gastrointestinal: Negative for abdominal pain, blood in stool, constipation, diarrhea, nausea and vomiting.   Genitourinary: Negative for decreased urine volume, difficulty urinating, dysuria, flank pain, frequency, hematuria, pelvic pain, urgency, vaginal discharge and vaginal pain.   Musculoskeletal: Negative for arthralgias, back pain, joint swelling, myalgias, neck pain and neck stiffness.   Skin: Negative for rash.   Neurological: Positive for headaches. Negative for dizziness, tremors, speech difficulty, weakness, light-headedness and numbness.        Occ migraines   Hematological: Negative for adenopathy. Does not bruise/bleed easily.   Psychiatric/Behavioral: Negative for agitation, behavioral problems, confusion, decreased concentration, depression,  dysphoric mood and sleep disturbance. The patient is not nervous/anxious and is not hyperactive.                 Objective:     Vitals:    08/11/19 1150   BP: 122/84   Pulse: 74   SpO2: 98%   Height: 5' 9 (1.753 m)   Weight: 214 lb (97.1 kg)   BMI (Calculated): 31.59     Body mass index is 31.6 kg/m??.    Physical Exam  Vitals signs reviewed.   Constitutional:       General: She is not in acute distress.     Appearance: Normal appearance. She is well-developed. She is not diaphoretic.   HENT:      Head: Normocephalic and atraumatic.      Right Ear: Tympanic membrane and external ear normal.      Left Ear: Tympanic membrane and external ear normal.      Nose: Nose normal.      Mouth/Throat:      Mouth: Mucous membranes are moist.      Pharynx: No oropharyngeal exudate.   Eyes:      General: No scleral icterus.     Extraocular Movements: Extraocular movements intact.      Conjunctiva/sclera: Conjunctivae normal.      Pupils: Pupils are equal, round, and reactive to light.   Neck:      Musculoskeletal: Normal range of motion and neck supple.      Thyroid: No thyromegaly.   Cardiovascular:      Rate and Rhythm: Normal rate and regular rhythm.      Heart sounds: Normal heart sounds. No murmur.   Pulmonary:      Effort: Pulmonary effort is normal.      Breath sounds: Normal breath sounds. No wheezing.   Abdominal:      General: Bowel sounds are normal. There is no distension.      Palpations: Abdomen is soft. There is no mass.      Tenderness: There is no abdominal tenderness. There is no guarding or rebound.  Musculoskeletal: Normal range of motion.         General: No tenderness.   Lymphadenopathy:      Cervical: No cervical adenopathy.   Skin:     General: Skin is warm.   Neurological:      General: No focal deficit present.      Mental Status: She is alert and oriented to person, place, and time.      Cranial Nerves: No cranial nerve deficit.      Motor: No abnormal muscle tone.      Coordination: Coordination  normal.      Deep Tendon Reflexes: Reflexes are normal and symmetric. Reflexes normal.   Psychiatric:         Mood and Affect: Mood normal.         Behavior: Behavior normal.         Thought Content: Thought content normal.         Judgment: Judgment normal.            Lab Results   Component Value Date    WBC 5.7 12/07/2015    HGB 12.5 12/07/2015    HCT 38.8 12/07/2015    MCV 94.8 12/07/2015    PLT 208 12/07/2015     Lab Results   Component Value Date    HGBA1C 5.1 12/07/2015     Lab Results   Component Value Date    CHOLTOT 162 12/07/2015    TRIG 63 12/07/2015    HDL 57 (L) 12/07/2015    LDL 92 12/07/2015     Lab Results   Component Value Date    TSH 1.99 12/07/2015     Lab Results   Component Value Date    VITD25H 17.5 (L) 12/07/2015     Lab Results   Component Value Date    GLUCOSE 94 12/07/2015    BUN 9 12/07/2015    CO2 29 12/07/2015    CREATININE 0.67 12/07/2015    K 4.3 12/07/2015    NA 142 12/07/2015    CL 108 12/07/2015    CALCIUM 8.6 12/07/2015    ALBUMIN 3.6 12/07/2015    PROT 5.7 (L) 12/07/2015    ALKPHOS 51 12/07/2015    ALT 8 12/07/2015    AST 12 (L) 12/07/2015    BILITOT 0.4 12/07/2015          Assessment/Plan:     Well exam  Health Maintenance Summary                Colon Cancer Screening / Stool Testing Overdue 05/25/2016     Immunization: Zoster Overdue 05/25/2016     Comprehensive Physical Exam Overdue 11/07/2018     Diabetes Screening Overdue 12/06/2018     Immunization: Influenza Next Due 09/24/2019     Depression Screening Next Due 08/10/2020     Lipid Panel Next Due 12/06/2020     Cervical Cancer Screening/Pap Smear (MyChart) Next Due 01/04/2021     Immunization: DTaP/Tdap/Td Next Due 11/01/2026         Anxiety prn alprazolam  Controlled Substance Monitoring 02/07/2015 12/01/2015 05/14/2017 09/12/2017 11/11/2017 09/04/2018 08/12/2019   OARRS/eKASPER Status Reviewed Reviewed Reviewed Reviewed Reviewed Reviewed Reviewed   OARRS/eKASPER Exclusion List (if applicable) - - - The drug is prescribed or personally  furnished for less than a 7-day supply - - -   OARRS/eKASPER Consistent with Prescriber Expectation Hazel Sams -   UDS Consistent with Prescriber Expectation - - Y - Y - -  I have completed the required OARRS/eKASPER documentation for this patient on 08/12/2019.  Veatrice Kells  The patient understands his/her medication(s).  Side effects/barriers of these medications were addressed.  Over-the-counter medications were also addressed during this encounter.  See pt instructions and orders  cologuard ordered               Veatrice Kells, MD

## 2019-08-11 NOTE — Unmapped (Signed)
Your goals based on today's visit are:(BRING THIS TO YOUR NEXT APPOINTMENT)   1. improve your diet by: low fat low carb diet  2. increase your exercise by: no change  3. Blood Pressure: <140/90   4. Cholesterol: Triglycerides <200, HDL >50, LDL <130   5. Other: Flushot in the fall   6. Your BMI is Body mass index is 31.6 kg/m??. so you should try to lose 25 pounds over the next year to reach your ideal body weight  7. Annual eye exam  8. Cancer screenings: Pap smears for women 21-65, mammograms for women 50-75, and colonoscopy : for patients over 29 years of age      Follow up appointment: 50yr    You can find a lot of good health information online at [email or web address], click on conditions and treatment at the top of the page or in the about [place] section there are some excellent links (to the ADA etc)     You may get a call or mailing about how satisfied you were with the care you received today. Please do take the time to give [place] your input. It will help [place] to improve the care you and others receive in the future. We thank you in advance for doing this!

## 2019-10-01 MED ORDER — valACYclovir (VALTREX) 1000 MG tablet
1000 | ORAL_TABLET | ORAL | 2 refills | Status: AC
Start: 2019-10-01 — End: 2020-04-20

## 2019-10-07 MED ORDER — naproxen (NAPROSYN) 500 MG tablet
500 | ORAL_TABLET | Freq: Two times a day (BID) | ORAL | 0 refills | Status: AC
Start: 2019-10-07 — End: 2020-03-14

## 2019-10-07 MED ORDER — cyclobenzaprine (FLEXERIL) 5 MG tablet
5 | ORAL_TABLET | Freq: Every day | ORAL | 5 refills | Status: AC
Start: 2019-10-07 — End: 2020-11-07

## 2019-10-07 MED ORDER — sulfamethoxazole-trimethoprim (BACTRIM DS) 800-160 mg per tablet
800-160 | ORAL_TABLET | Freq: Every day | ORAL | 0 refills | Status: AC | PRN
Start: 2019-10-07 — End: 2020-03-12

## 2019-10-07 NOTE — Telephone Encounter (Signed)
08/11/2019 Sherry Erie, MD     No future appointments.

## 2019-10-07 NOTE — Unmapped (Signed)
08/11/2019 Sherry Murthy, MD     No future appointments.

## 2019-11-13 MED ORDER — ciprofloxacin HCl (CIPRO) 500 MG tablet
500 | ORAL_TABLET | Freq: Two times a day (BID) | ORAL | 0 refills | Status: AC
Start: 2019-11-13 — End: 2020-11-07

## 2020-03-07 NOTE — Telephone Encounter (Signed)
Possible exposure to a STD. Could labs for blood work and urine please be sent into the lab?    Good call back [phone]

## 2020-03-07 NOTE — Telephone Encounter (Signed)
Okay to inform via mychart when labs are sent in    Thank you

## 2020-03-08 ENCOUNTER — Other Ambulatory Visit: Admit: 2020-03-08 | Payer: PRIVATE HEALTH INSURANCE

## 2020-03-08 DIAGNOSIS — Z202 Contact with and (suspected) exposure to infections with a predominantly sexual mode of transmission: Secondary | ICD-10-CM

## 2020-03-08 LAB — CHLAMYDIA / GONORRHOEAE DNA URINE
Chlamydia Trachomatis DNA Urine: NEGATIVE
Neisseria gonorrhoeae DNA Urine: NEGATIVE

## 2020-03-08 LAB — HIV 1+2 ANTIBODY/ANTIGEN WITH REFLEX: HIV 1+2 AB/AGN: NONREACTIVE

## 2020-03-08 LAB — HEPATITIS C ANTIBODY: HCV Ab: NONREACTIVE

## 2020-03-14 MED ORDER — sulfamethoxazole-trimethoprim (BACTRIM DS) 800-160 mg per tablet
800-160 | ORAL_TABLET | Freq: Every day | ORAL | 0 refills | Status: AC | PRN
Start: 2020-03-14 — End: 2020-05-31

## 2020-03-14 MED ORDER — naproxen (NAPROSYN) 500 MG tablet
500 | ORAL_TABLET | Freq: Two times a day (BID) | ORAL | 0 refills | Status: AC
Start: 2020-03-14 — End: 2020-11-02

## 2020-03-17 ENCOUNTER — Ambulatory Visit: Payer: PRIVATE HEALTH INSURANCE

## 2020-03-23 ENCOUNTER — Ambulatory Visit: Payer: PRIVATE HEALTH INSURANCE

## 2020-04-20 MED ORDER — valACYclovir (VALTREX) 1000 MG tablet
1000 | ORAL_TABLET | ORAL | 2 refills | Status: AC
Start: 2020-04-20 — End: 2020-12-09

## 2020-05-31 NOTE — Unmapped (Signed)
Forwarded to covering physician  Next Primary Care Appt:   No future appointments.    Clinician Assessment of Risk (since 06/18/1948)     None        Lab Results   Component Value Date    HGBA1C 5.1 12/07/2015    HGBA1C 4.8 10/22/2012     BP Readings from Last 3 Encounters:   08/11/19 122/84   09/04/18 132/77   11/07/17 124/86     Health Maintenance Due   Topic Date Due   ??? Immunization: COVID-19 (1) Never done   ??? Colon Cancer Screening / Stool Testing  Never done   ??? Immunization: Zoster (1 of 2) Never done   ??? Diabetes Screening  12/06/2018       Lab Results   Component Value Date    CHOLTOT 162 12/07/2015    TRIG 63 12/07/2015    HDL 57 (L) 12/07/2015    LDL 92 12/07/2015       [facility] ED/ Hospital Admissions last 365 days:  Total [facility] ED Count: 0     Total [facility] Admission Count: 0       ???

## 2020-06-01 MED ORDER — sulfamethoxazole-trimethoprim (BACTRIM DS) 800-160 mg per tablet
800-160 | ORAL_TABLET | Freq: Every day | ORAL | 0 refills | Status: AC | PRN
Start: 2020-06-01 — End: 2020-08-30

## 2020-08-16 NOTE — Telephone Encounter (Signed)
Patient called the office ands says that she is experiencing Uterine prolaps.     She is in need of a referral for Urology/gynocologist to talk about possible surgery.     Please place referral-   [phone]

## 2020-08-17 NOTE — Unmapped (Signed)
Please return patient's call - happy to see her for this issue and potentially address in our office before referring to GYN, or can refer directly to GYN if that's her preference.    Dr Julian Reil

## 2020-08-17 NOTE — Unmapped (Signed)
LVM for pt to call the office to schedule an appt.

## 2020-08-30 MED ORDER — sulfamethoxazole-trimethoprim (BACTRIM DS) 800-160 mg per tablet
800-160 | ORAL_TABLET | ORAL | 0 refills | Status: AC
Start: 2020-08-30 — End: 2020-11-02

## 2020-10-27 ENCOUNTER — Inpatient Hospital Stay: Admit: 2020-10-27 | Payer: PRIVATE HEALTH INSURANCE

## 2020-10-27 DIAGNOSIS — Z1231 Encounter for screening mammogram for malignant neoplasm of breast: Secondary | ICD-10-CM

## 2020-11-01 NOTE — Unmapped (Signed)
Next Primary Care Appt:   Future Appointments   Date Time Provider Department Center   11/02/2020  2:30 PM [facility] ROOM [facility]   11/04/2020 10:55 AM COVID TESTING - [facility] COV [facility]   11/07/2020  1:00 PM [facility]PC PHONE SCREEN [facility]PC [facility]   12/07/2020  9:00 AM Shelby M. Gardner, MD PC FM WCS [facility]CS       Clinician Assessment of Risk (since 11/19/1948)     None        Lab Results   Component Value Date    HGBA1C 5.1 12/07/2015    HGBA1C 4.8 10/22/2012     Lab Results   Component Value Date    TSH 1.99 12/07/2015     BP Readings from Last 3 Encounters:   08/11/19 122/84   09/04/18 132/77   11/07/17 124/86     Health Maintenance Due   Topic Date Due   ??? Alcohol Misuse Screening  Never done   ??? Colon Cancer Screening / Stool Testing  Never done   ??? Lung Cancer Screening  Never done   ??? Immunization: Zoster (1 of 2) Never done   ??? Diabetes Screening  12/06/2018   ??? Depression Screening  08/10/2020   ??? Comprehensive Physical Exam  08/10/2020   ??? Immunization: Influenza (1) 08/24/2020   ??? Cervical Cancer Screening/Pap Smear (MyChart)  01/04/2021       Lab Results   Component Value Date    CHOLTOT 162 12/07/2015    TRIG 63 12/07/2015    HDL 57 (L) 12/07/2015    LDL 92 12/07/2015       [facility] ED/ Hospital Admissions last 365 days:  Total [facility] ED Count: 0     Total [facility] Hospital Admission Count: 0       ???

## 2020-11-01 NOTE — Unmapped (Signed)
Next Primary Care Appt:   Future Appointments   Date Time Provider Department Center   11/02/2020  2:30 PM [facility] ROOM [facility]   11/04/2020 10:55 AM COVID TESTING - [facility] UCP COV [facility]CS [facility]CS   11/07/2020  1:00 PM CPC PHONE SCREEN [facility] CPC [facility]   12/07/2020  9:00 AM Mayford Knife, MD PC FM [facility]       Clinician Assessment of Risk (since 11/19/1948)     None        Lab Results   Component Value Date    HGBA1C 5.1 12/07/2015    HGBA1C 4.8 10/22/2012     Lab Results   Component Value Date    TSH 1.99 12/07/2015     BP Readings from Last 3 Encounters:   08/11/19 122/84   09/04/18 132/77   11/07/17 124/86     Health Maintenance Due   Topic Date Due   ??? Alcohol Misuse Screening  Never done   ??? Colon Cancer Screening / Stool Testing  Never done   ??? Lung Cancer Screening  Never done   ??? Immunization: Zoster (1 of 2) Never done   ??? Diabetes Screening  12/06/2018   ??? Depression Screening  08/10/2020   ??? Comprehensive Physical Exam  08/10/2020   ??? Immunization: Influenza (1) 08/24/2020   ??? Cervical Cancer Screening/Pap Smear (MyChart)  01/04/2021       Lab Results   Component Value Date    CHOLTOT 162 12/07/2015    TRIG 63 12/07/2015    HDL 57 (L) 12/07/2015    LDL 92 12/07/2015       [facility] ED/ Hospital Admissions last 365 days:  Total [facility] ED Count: 0     Total [facility] Admission Count: 0       ???

## 2020-11-02 ENCOUNTER — Ambulatory Visit: Admit: 2020-11-02 | Payer: PRIVATE HEALTH INSURANCE

## 2020-11-02 DIAGNOSIS — Z01818 Encounter for other preprocedural examination: Secondary | ICD-10-CM

## 2020-11-02 LAB — CBC
Hematocrit: 37.3 % (ref 35.0–45.0)
Hematocrit: 37.3 % (ref 35.0–45.0)
Hemoglobin: 12.7 g/dL (ref 11.7–15.5)
Hemoglobin: 12.7 g/dL (ref 11.7–15.5)
MCH: 34.4 pg (ref 27.0–33.0)
MCH: 34.4 pg — ABNORMAL HIGH (ref 27.0–33.0)
MCHC: 34.1 g/dL (ref 32.0–36.0)
MCHC: 34.1 g/dL (ref 32.0–36.0)
MCV: 100.9 fL (ref 80.0–100.0)
MCV: 100.9 fL — ABNORMAL HIGH (ref 80.0–100.0)
MPV: 8 fL (ref 7.5–11.5)
MPV: 8 fL (ref 7.5–11.5)
Platelets: 294 10*3/uL (ref 140–400)
Platelets: 294 10*3/uL (ref 140–400)
RBC: 3.7 10*6/uL (ref 3.80–5.10)
RBC: 3.7 10*6/uL — ABNORMAL LOW (ref 3.80–5.10)
RDW: 13.8 % (ref 11.0–15.0)
RDW: 13.8 % (ref 11.0–15.0)
WBC: 6.2 10*3/uL (ref 3.8–10.8)
WBC: 6.2 10*3/uL (ref 3.8–10.8)

## 2020-11-02 LAB — ABO/RH
Rh Factor: NEGATIVE
Rh Type: NEGATIVE

## 2020-11-02 LAB — ANTIBODY SCREEN
Antibody Screen: NEGATIVE
Antibody Screen: NEGATIVE

## 2020-11-02 MED ORDER — naproxen (NAPROSYN) 500 MG tablet
500 | ORAL_TABLET | ORAL | 0 refills | Status: AC
Start: 2020-11-02 — End: 2020-11-07

## 2020-11-02 MED ORDER — sulfamethoxazole-trimethoprim (BACTRIM DS) 800-160 mg per tablet
800-160 | ORAL_TABLET | ORAL | 0 refills | Status: AC
Start: 2020-11-02 — End: 2021-01-02

## 2020-11-04 ENCOUNTER — Institutional Professional Consult (permissible substitution): Admit: 2020-11-04 | Discharge: 2020-11-12 | Payer: PRIVATE HEALTH INSURANCE

## 2020-11-04 ENCOUNTER — Ambulatory Visit: Admit: 2020-11-04 | Payer: PRIVATE HEALTH INSURANCE

## 2020-11-04 DIAGNOSIS — Z01818 Encounter for other preprocedural examination: Secondary | ICD-10-CM

## 2020-11-04 LAB — 2019 NOVEL CORONAVIRUS (COVID-19), NAA-B: SARS-CoV-2: NOT DETECTED

## 2020-11-04 NOTE — Unmapped (Signed)
Covid-19 nasopharyngeal specimen collected.

## 2020-11-05 LAB — COVID-19: SARS-CoV-2: NOT DETECTED

## 2020-11-07 ENCOUNTER — Ambulatory Visit: Payer: PRIVATE HEALTH INSURANCE

## 2020-11-07 NOTE — Unmapped (Signed)
Pre-Procedure Instructions  We???re pleased that you have chosen [facility] for your upcoming procedure.  The staff serving you is professionally trained to provide the highest quality care.  We encourage you to ask questions and to let the staff know your special needs.  We want your visit to be as comfortable as possible.    Your surgery is scheduled on 11/16.    Please arrive at 1300 PM and check in at the Registration Desk on your right as you enter the lobby of the main hospital.    STARTING ONE WEEK BEFORE SURGERY  WE WOULD LIKE YOU TO STOP/NOT TAKE ASPIRIN, NSAIDS (non-steroidal anti-inflammatories such as Ibuprofen, Advil, Aleve, Naproxen, Excedrin, Meloxicam, Mobic, Celebrex, Diclofenac, and Voltaren), SUPPLEMENTS, FISH OIL, VITAMINS, AND HERBAL SUPPLEMENTS.  ACETAMINOPHEN (TYLENOL) IS THE ONLY OVER THE COUNTER PAIN MEDICATION THAT IS OK TO TAKE BEFORE SURGERY.    STARTING 3 DAYS BEFORE SURGERY  Patients should hold the following diabetic medications: Jardiance, Jardiomet, Farxiga, Invokana, Xigudo    INSTRUCTIONS FOR THE DAY OF SURGERY    ?? DO NOT EAT OR DRINK ANYTHING (including gum, mints, water, etc.) after midnight the night before your procedure except for 16 oz of clear fluid by 11am.  You may brush your teeth and gargle on the morning of surgery, but do not swallow any water, except for a small sip, with the following medication: na     ??? Please make transportation arrangements and bring, at least, one responsible adult to accompany you home and remain with you for 24 hours.  At this time we are allowing ONLY TWO ADULT VISITORS per patient in our facilities.  ALL visitors must wear a mask.   Visitors will not be able to spend the night and will be asked to leave when visiting hours end @ 7PM.    ??? We recommend that you leave valuables (i.e. money, jewelry, credit cards) at home or with your family.  If you wear glasses or contacts, bring a case for safekeeping.    ??? Wear casual, loose  fitting, and comfortable clothing.  A gown will be provided. (In an effort to keep your personal belongings safe it is recommended that you leave these belongings with your family until admitted to your room after surgery. If you do bring a bag for an overnight stay, please note that storage space is limited in the surgery area.)    ??? Bring a list of your medications and dose including herbal.  Do not bring any pills or medications to the hospital. (Exception: transplant patients.)    ??? Bring a photo ID and your insurance card so your insurance company can be billed directly.    ??? Do not shave in the area of the surgery for 2 days prior to surgery.  If needed, a trained staff member will clip the area immediately before your surgery.    ??? Quit smoking as far in advance of surgery as possible. Patients who quit at least 30 days before surgery may have better outcomes.    ??? If you are diabetic, pay close attention to your blood sugar and try to keep it in the range your doctor wants it to be in.  If you have low blood sugar prior to coming in to the hospital you may drink a small amount of CLEAR SUGAR-CONTAINING FLUIDS WITHOUT PULP SUCH AS APPLE JUICE OR CLEAR SODA.  Depending on the procedure you are having this could impact the timing of your  surgery.    ??? If you have a cold or are sick prior to surgery, contact your surgeon.    ??? Please shower at home the evening before and the morning of surgery using an antibacterial soap, such as Dial or Safeguard.    ??? Please remove all makeup, nail polish, jewelry, wristwatch, body piercings, powder, lotions, deodorant and perfume/cologne before you arrive.      Special Instructions    1. Please bring Rescue Inhaler the day of surgery.  2. Please bring CPAP machine and all associated equipment to the hospital the day of surgery      ??       Antibacterial showering and good hand hygiene are essential to prevent surgical site infections and reduce the spread of MRSA.  Patient  verbalized understanding of these instructions.    Make sure all of your health care givers are checking your ID bracelet and verifying your name and date of birth.  You will actively be involved in verifying the type of surgery you are having and the correct site.  Your health care givers should be cleaning their hands with soap and water or antibacterial foam before taking care of you and if they do not it is ok to remind them to do so.      In an effort to reduce the risks of blood clots after your surgery you may have compression sleeves on your lower legs.  These sleeves help facilitate circulation and decrease the chances of developing any blood clots.     You may be given an incentive spirometer after surgery to use every hour to help prevent pneumonia by having you take deep breaths in and out.  You will be given instructions about proper use after surgery.         Patient/Family provided education about surgical site infection prevention.    Contact information:    [facility],  Monday - Friday 8:00 am - 4:30 pm,   [phone].    If you need to reach someone outside of regular business hours regarding your surgery please call your surgeon or   [facility] at [phone].

## 2020-11-08 LAB — ABO/RH
Rh Factor: NEGATIVE
Rh Type: NEGATIVE

## 2020-11-08 MED ORDER — HYDROmorphone (DILAUDID) 0.5 mg/0.5 mL injection Syrg
0.5 | INTRAMUSCULAR | Status: AC
Start: 2020-11-08 — End: 2020-11-08
  Administered 2020-11-08: 23:00:00 0.2

## 2020-11-08 MED ORDER — fentaNYL (SUBLIMAZE) injection 12.5 mcg
50 | INTRAMUSCULAR | Status: AC | PRN
Start: 2020-11-08 — End: 2020-11-08

## 2020-11-08 MED ORDER — dexamethasone (DECADRON) injection
4 | INTRAMUSCULAR | Status: AC | PRN
Start: 2020-11-08 — End: 2020-11-08
  Administered 2020-11-08: 21:00:00 8 via INTRAVENOUS

## 2020-11-08 MED ORDER — famotidine (PF) (PEPCID) injection 20 mg
20 | Freq: Once | INTRAVENOUS | Status: AC
Start: 2020-11-08 — End: 2020-11-08
  Administered 2020-11-08: 20:00:00 20 mg via INTRAVENOUS

## 2020-11-08 MED ORDER — glucose chewable tablet 12 g
4 | ORAL | Status: AC | PRN
Start: 2020-11-08 — End: 2020-11-08

## 2020-11-08 MED ORDER — lactated Ringers infusion
INTRAVENOUS | Status: AC
Start: 2020-11-08 — End: 2020-11-08
  Administered 2020-11-08: 19:00:00 20 mL/h via INTRAVENOUS

## 2020-11-08 MED ORDER — oxyCODONE (ROXICODONE) immediate release tablet 2.5 mg
5 | ORAL | Status: AC | PRN
Start: 2020-11-08 — End: 2020-11-08
  Administered 2020-11-08: 23:00:00 2.5 mg via ORAL

## 2020-11-08 MED ORDER — ondansetron (ZOFRAN) injection
4 | INTRAMUSCULAR | Status: AC | PRN
Start: 2020-11-08 — End: 2020-11-08
  Administered 2020-11-08: 21:00:00 4 via INTRAVENOUS

## 2020-11-08 MED ORDER — peppermint oiL liquid 1 mL
Status: AC | PRN
Start: 2020-11-08 — End: 2020-11-08

## 2020-11-08 MED ORDER — ketorolac (TORADOL) 30 mg/mL (1 mL) injection
30 | INTRAMUSCULAR | Status: AC
Start: 2020-11-08 — End: ?

## 2020-11-08 MED ORDER — HYDROmorphone (DILAUDID) injection Syrg 0.2 mg
0.5 | INTRAMUSCULAR | Status: AC | PRN
Start: 2020-11-08 — End: 2020-11-08

## 2020-11-08 MED ORDER — naloxoneNARCANinjection004mg
0.4 | INTRAMUSCULAR | Status: AC | PRN
Start: 2020-11-08 — End: 2020-11-08

## 2020-11-08 MED ORDER — ceFAZolin (ANCEF) 2 gram in NSS 100 mL IVPB
2 | INTRAVENOUS | Status: AC | PRN
Start: 2020-11-08 — End: 2020-11-08
  Administered 2020-11-08: 21:00:00 2 g via INTRAVENOUS

## 2020-11-08 MED ORDER — lidocaine (PF) (XYLOCAINE) 10 mg/mL (1 %)
10 | INTRAMUSCULAR | Status: AC
Start: 2020-11-08 — End: 2020-11-08

## 2020-11-08 MED ORDER — dextrose 50 % in water (D50W) iv Syrg 25 mL
INTRAVENOUS | Status: AC | PRN
Start: 2020-11-08 — End: 2020-11-08

## 2020-11-08 MED ORDER — gabapentin (NEURONTIN) capsule 600 mg
300 | ORAL | Status: AC | PRN
Start: 2020-11-08 — End: 2020-11-08
  Administered 2020-11-08: 19:00:00 600 mg via ORAL

## 2020-11-08 MED ORDER — HYDROmorphone (DILAUDID) 0.5 mg/0.5 mL injection Syrg
0.5 | INTRAMUSCULAR | Status: AC
Start: 2020-11-08 — End: ?

## 2020-11-08 MED ORDER — lidocaine (PF) 20 mg/mL (2 %) Soln
20 | INTRAVENOUS | Status: AC | PRN
Start: 2020-11-08 — End: 2020-11-08
  Administered 2020-11-08: 21:00:00 100 via INTRAVENOUS

## 2020-11-08 MED ORDER — acetaminophen (TYLENOL) tablet 975 mg
325 | ORAL | Status: AC | PRN
Start: 2020-11-08 — End: 2020-11-08
  Administered 2020-11-08: 19:00:00 975 mg via ORAL

## 2020-11-08 MED ORDER — HYDROmorphone (DILAUDID) injection Syrg
1 | INTRAMUSCULAR | Status: AC | PRN
Start: 2020-11-08 — End: 2020-11-08
  Administered 2020-11-08 (×2): .5 via INTRAVENOUS

## 2020-11-08 MED ORDER — HYDROmorphone (DILAUDID) injection Syrg 0.5 mg
0.5 | INTRAMUSCULAR | Status: AC | PRN
Start: 2020-11-08 — End: 2020-11-08

## 2020-11-08 MED ORDER — propofol 10 mg/ml (DIPRIVAN) injection
10 | INTRAVENOUS | Status: AC | PRN
Start: 2020-11-08 — End: 2020-11-08
  Administered 2020-11-08: 21:00:00 200 via INTRAVENOUS

## 2020-11-08 MED ORDER — oxyCODONE (ROXICODONE) immediate release tablet 5 mg
5 | ORAL | Status: AC | PRN
Start: 2020-11-08 — End: 2020-11-08

## 2020-11-08 MED ORDER — lidocaine (PF) (XYLOCAINE) 10 mg/mL (1 %)
10 | INTRAMUSCULAR | Status: AC | PRN
Start: 2020-11-08 — End: 2020-11-08
  Administered 2020-11-08: 22:00:00 18

## 2020-11-08 MED ORDER — bupivacaine-EPINEPHrine 0.25 %-1:200,000 injection
0.25 | INTRAMUSCULAR | Status: AC | PRN
Start: 2020-11-08 — End: 2020-11-08
  Administered 2020-11-08: 22:00:00 3 via INTRAPLEURAL

## 2020-11-08 MED ORDER — celecoxib (CELEBREX) capsule 400 mg
200 | ORAL | Status: AC | PRN
Start: 2020-11-08 — End: 2020-11-08
  Administered 2020-11-08: 19:00:00 400 mg via ORAL

## 2020-11-08 MED ORDER — sodium chloride 0.9 % irrigation
0.9 | Status: AC | PRN
Start: 2020-11-08 — End: 2020-11-08
  Administered 2020-11-08: 21:00:00 3000

## 2020-11-08 MED ORDER — bupivacaine-EPINEPHrine (PF) (SENSORCAINE) 0.25 %-1:200,000 injection Soln
0.25 | INTRAMUSCULAR | Status: AC
Start: 2020-11-08 — End: 2020-11-08

## 2020-11-08 MED ORDER — ondansetron (ZOFRAN) injection 4 mg
4 | Freq: Three times a day (TID) | INTRAMUSCULAR | Status: AC | PRN
Start: 2020-11-08 — End: 2020-11-08

## 2020-11-08 MED ORDER — oxyCODONE-acetaminophen (PERCOCET) 5-325 mg per tablet
5-325 | ORAL_TABLET | Freq: Four times a day (QID) | ORAL | 0 refills | Status: AC | PRN
Start: 2020-11-08 — End: 2020-11-15

## 2020-11-08 MED ORDER — ketorolac (TORADOL) injection 30 mg
30 | INTRAMUSCULAR | Status: AC | PRN
Start: 2020-11-08 — End: 2020-11-08
  Administered 2020-11-08: 21:00:00 30 via INTRAVENOUS

## 2020-11-08 MED ORDER — succinylcholine (QUELICIN) injection
20 | INTRAMUSCULAR | Status: AC | PRN
Start: 2020-11-08 — End: 2020-11-08
  Administered 2020-11-08: 21:00:00 120 via INTRAVENOUS

## 2020-11-08 MED ORDER — fentaNYL (SUBLIMAZE) injection 25 mcg
50 | INTRAMUSCULAR | Status: AC | PRN
Start: 2020-11-08 — End: 2020-11-08

## 2020-11-08 MED ORDER — dextrose 50 % in water (D50W) iv Syrg 50 mL
INTRAVENOUS | Status: AC | PRN
Start: 2020-11-08 — End: 2020-11-08

## 2020-11-08 MED ORDER — docusate sodium (COLACE) 100 MG capsule
100 | ORAL_CAPSULE | Freq: Two times a day (BID) | ORAL | 0 refills | Status: AC
Start: 2020-11-08 — End: 2021-08-27

## 2020-11-08 MED FILL — CELEBREX 200 MG CAPSULE: 200 200 mg | ORAL | Qty: 2

## 2020-11-08 MED FILL — HYDROMORPHONE 0.5 MG/0.5 ML INJECTION SYRINGE: 0.5 0.5 mg/0.5 mL | INTRAMUSCULAR | Qty: 0.5

## 2020-11-08 MED FILL — BUPIVACAINE-EPINEPHRINE (PF) 0.25 %-1:200,000 INJECTION SOLUTION: 0.25 0.25 %-1:200,000 | INTRAMUSCULAR | Qty: 30

## 2020-11-08 MED FILL — CEFAZOLIN (ANCEF) 2 GRAM IN NSS IVPB - COMPOUNDED (ADM): 2 2 gram/100 mL | INTRAVENOUS | Qty: 100

## 2020-11-08 MED FILL — GABAPENTIN 300 MG CAPSULE: 300 300 MG | ORAL | Qty: 2

## 2020-11-08 MED FILL — KETOROLAC 30 MG/ML (1 ML) INJECTION SOLUTION: 30 30 mg/mL (1 mL) | INTRAMUSCULAR | Qty: 1

## 2020-11-08 MED FILL — FAMOTIDINE (PF) 20 MG/2 ML INTRAVENOUS SOLUTION: 20 20 mg/2 mL | INTRAVENOUS | Qty: 2

## 2020-11-08 MED FILL — TYLENOL 325 MG TABLET: 325 325 mg | ORAL | Qty: 3

## 2020-11-08 MED FILL — XYLOCAINE-MPF 10 MG/ML (1 %) INJECTION SOLUTION: 10 10 mg/mL (1 %) | INTRAMUSCULAR | Qty: 30

## 2020-11-08 MED FILL — LACTATED RINGERS INTRAVENOUS SOLUTION: 20.00 20.00 mL/hr | INTRAVENOUS | Qty: 1000

## 2020-11-08 MED FILL — OXYCODONE 5 MG TABLET: 5 5 MG | ORAL | Qty: 1

## 2020-11-08 NOTE — Unmapped (Signed)
Anesthesia Post Note    Patient: Sherry Compton    Procedure(s) Performed: Procedure(s):  PERINEORRHAPHY, TRANS-VAGINAL TAPE;  CYSTOSCOPY  LABIAPLASTY    Anesthesia type: general    Patient location: PACU    Post pain: Adequate analgesia    Post assessment: no apparent anesthetic complications and tolerated procedure well    Last Vitals:   Vitals:    11/08/20 1720 11/08/20 1735 11/08/20 1745 11/08/20 1800   BP: 143/90 139/88 147/86 140/84   BP Location:       Patient Position:       Pulse: 76 76 64 71   Resp: 23 18 18 12    Temp:       TempSrc:       SpO2: 100% 100% 99% 100%   Height:            Post vital signs: stable    Level of consciousness: awake, alert  and oriented    Complications: None

## 2020-11-08 NOTE — Unmapped (Signed)
[name]                        [name]      PATIENT NAME:      Sherry Compton, Sherry Compton ??              MRN:               [id number]  DATE OF BIRTH:     [birth year]                    CSN:               [id number]  PHYSICIAN:         Lamona Curl, MD                 ADMIT DATE:        11/08/2020  DICTATED BY:       Lamona Curl, MD                 SURGERY DATE:      11/08/2020                               PROCEDURE NOTE    SURGEON:  Lamona Curl, MD      PREOPERATIVE DIAGNOSES:    1. Stress urinary incontinence.  2. Labial hypertrophy with dyspareunia.    POSTOPERATIVE DIAGNOSES:    1. Stress urinary incontinence.  2. Labial hypertrophy with dyspareunia.    PROCEDURE:    1. Transvaginal tape.  2. Cystoscopy.  3. Labiaplasty.  4. Perineoplasty.    ANESTHESIA:  Endotracheal.    COMPLICATIONS:  None.    ESTIMATED BLOOD LOSS:  Less than 50.    INDICATIONS:  54 year old female who has had 100-pound weight loss with subsequent labial hypertrophy causing dyspareunia.  Further, she has had increased stress urinary continence.  Evaluation in the office demonstrated a hypermobile urethra with   postvoid residual of less than 10 mL.  Risks, benefits, and alternatives were discussed with the patient at length including failure, injury, and need for revision.    SPECIMENS:  None.    DESCRIPTION OF PROCEDURE:  The patient was brought back to the operating room.  General anesthesia was found to be adequate.  She was placed in dorsal lithotomy position, prepped and draped in the normal sterile fashion.  A weighted speculum was placed   in the posterior vagina.  The Lakeia Bradshaw catheter was then placed into the bladder, placing tension on the catheter.  The mid urethra was identified.  Using Allis clamps, the vaginal mucosa was grasped in the midline, tented, and allowed for incision of the   vaginal mucosa in the midline.  Once this was completed, the lidocaine was then used to infiltrate the retrovaginal space  leading out to the pubic ramus.  This was done bilaterally.  The lidocaine was then infiltrated in the space of Retzius to allow for   hydrodissection.  Once this was performed, Metzenbaum scissors were used to dissect the vaginal mucosa back to the pubic ramus bilaterally.  The TVT trocar was then passed to the pubic ramus.  Once the resistance was felt, the trocar was then moved into   the retropubic space and then rotated through the space of Retzius and brought through the skin.  The skin incision was made with a scalpel allowing passage of the trocar.  In a similar aspect on the patient's left, this was performed and brought   through the space of Retzius.  Once this was completed, cystoscopy was performed.  The bladder was evaluated throughout.  No injury was appreciated.  Once the bladder was fully inspected, the flow through the bilateral urethras was noted.  As the   cystoscope was removed, the urethra was evaluated and no injury was appreciated.  Once this was completed, the mesh was then brought into place.  The covering was removed and the mesh was brought into the mid urethral position.  The sheath was removed.    The mesh was then cut at the skin at the retropubic space.  The vaginal mucosa was then closed with a running 3-0 chromic.  Once this was completed, attention was turned to the labiaplasty.  A wedge resection was made on the left labia and then closed   with a running 3-0 chromic.  The right labia was then evaluated and in a likewise manner, a wedge was taken out of the labia and this labia was then closed.  Following this, 1% lidocaine was infiltrated at the perineal body and through the retrovaginal   space for hydrodissection.  Following this, an inverted triangle incision was made over the perineal body and then, a likewise wedge resection was made on the vaginal mucosa.  The overlying skin and vaginal mucosa were removed.  The subcutaneous space   was then closed with interrupted 3-0  Vicryl and then, the remainder of the repair was performed in a standard second-degree laceration repair.  The Akili Corsetti catheter was removed.  All operative sites were hemostatic.  The patient went to PACU in stable   condition.        Lamona Curl, MD      IF/AQ  DD:?? 11/08/2020 17:09:25  DT:?? 11/08/2020 21:39:17    JOB#:  [id number]

## 2020-11-08 NOTE — Unmapped (Signed)
INTRA-OP POST BRIEFING NOTE: Sherry Compton      Specimens:     Prior to leaving the room: Nurse confirmed name of procedure, completion of instrument, sponge & needle counts, reads specimen labels aloud including patient name and addresses any equipment issues? Nurse confirmed wound class. Nurse to surgeon and anesthesia: What are key concerns for recovery and management of the patient?  Yes      Blood products stored at appropriate temperatures prior to return to blood bank (if applicable)? N/A      Patient identification band secured on patient prior to transfer out of the operating room? Yes    Temporary devices implanted for the duration of the surgery removed and evaluated for intactness and completeness prior to closure? N/A      Other Comments:     SignedKurtis Bushman    Date: 11/08/2020    Time: 5:07 PM

## 2020-11-08 NOTE — Unmapped (Signed)
Pain 2/10 VSS no nausea. Tolerating liquids. Same Day RN will recover in [place] per her request. Update given at bedside.

## 2020-11-08 NOTE — Unmapped (Signed)
Anesthesia Transfer of Care Note    Patient: Sherry Compton  Procedure(s) Performed: Procedure(s):  PERINEORRHAPHY, TRANS-VAGINAL TAPE;  CYSTOSCOPY  LABIAPLASTY    Patient location: PACU    Anesthesia type: general    Airway Device on Arrival to PACU/ICU: Nasal Cannula    IV Access: Peripheral    Monitors Recommended to be Used During PACU/ICU: Standard Monitors    Outstanding Issues to Address: None    Level of Consciousness: awake    Post vital signs:    Vitals:    11/08/20 1712   BP: (!) 152/94   Pulse: 78   Resp: 27   Temp: 97.7 ??F (36.5 ??C)   SpO2: 100%       Complications: None    Date 11/07/20 1500 - 11/08/20 0659(Not Admitted) 11/08/20 0700 - 11/09/20 0659   Shift 1500-2259 2300-0659 24 Hour Total 0700-1459 1500-2259 2300-0659 24 Hour Total   INTAKE   I.V.     550  550     Volume (mL) (lactated Ringers infusion)     550  550   Shift Total     550  550   OUTPUT   Urine     150  150     Urine     150  150   Shift Total     150  150   Weight (kg)

## 2020-11-08 NOTE — Unmapped (Signed)
[place]  DEPARTMENT OF ANESTHESIOLOGY  PRE-PROCEDURAL EVALUATION    Sherry Compton is a 54 y.o. year old female presenting for:    Procedure(s):  PERINEORRHAPHY, TRANS-VAGINAL TAPE;  CYSTOSCOPY  LABIAPLASTY    Surgeon:   Lamona Curl, MD    Chief Complaint     STRESS INCONTINENCE; LABIAL HYPERTROPHY    Review of Systems     Anesthesia Evaluation    Patient summary reviewed and nursing notes reviewed.  All other systems reviewed and are negative.     I have reviewed the History and Physical Exam, any relevant changes are noted in the anesthesia pre-operative evaluation.      Cardiovascular:    Exercise tolerance: good      Neuro/Muscoloskeletal/Psych:        Pulmonary:        GI/Hepatic/Renal:        Endo/Other:          Past Medical History     Past Medical History:   Diagnosis Date   ??? Anxiety    ??? Wears glasses     readers       Past Surgical History     Past Surgical History:   Procedure Laterality Date   ??? AUGMENTATION BREAST ENDOSCOPIC     ??? ENDOMETRIAL ABLATION     ??? gastric sleeve bariatric surgeyr by dr Clementeen Graham         Family History     Family History   Problem Relation Age of Onset   ??? Heart disease Mother         mi at age high 85   ??? Migraines Sister    ??? Diabetes Paternal Grandmother    ??? Heart disease Paternal Grandmother    ??? Miscarriages / Stillbirths Paternal Grandmother        Social History     Social History     Socioeconomic History   ??? Marital status: Married     Spouse name: Not on file   ??? Number of children: Not on file   ??? Years of education: Not on file   ??? Highest education level: Not on file   Occupational History   ??? Not on file   Tobacco Use   ??? Smoking status: Former Smoker     Quit date: 11/07/1992     Years since quitting: 28.0   ??? Smokeless tobacco: Never Used   Substance and Sexual Activity   ??? Alcohol use: No   ??? Drug use: No   ??? Sexual activity: Yes     Partners: Male   Other Topics Concern   ??? Caffeine Use Yes   ??? Occupational Exposure Not Asked   ??? Exercise Yes     Comment: 2 -3  times a week riding bikes and walking   ??? Seat Belt Yes   Social History Narrative   ??? Not on file     Social Determinants of Health     Financial Resource Strain: Not on file   Physical Activity: Not on file   Stress: Not on file   Social Connections: Not on file   Housing Stability: Not on file       Medications     Allergies:  No Known Allergies    Home Meds:  Prior to Admission medications as of 11/08/20 1412   Medication Sig Taking?   sulfamethoxazole-trimethoprim (BACTRIM DS) 800-160 mg per tablet TAKE 1 TABLET BY MOUTH DAILY AS NEEDED AFTER SEXUAL INTERCOURSE TO PREVENT  UTI    valACYclovir (VALTREX) 1000 MG tablet 2,000mg  q 12 hours x 1 day for cold sore.        Inpatient Meds:  Scheduled:   Continuous:   ??? lactated Ringers 20 mL/hr (11/08/20 1407)       PRN: ceFAZolin (ANCEF) IVPB, dextrose 50 % in water (D50W) **OR** dextrose 50 % in water (D50W), glucose    Vital Signs     Wt Readings from Last 3 Encounters:   08/11/19 214 lb (97.1 kg)   09/04/18 218 lb (98.9 kg)   11/07/17 209 lb 14.4 oz (95.2 kg)     Ht Readings from Last 3 Encounters:   11/08/20 5' 9 (1.753 m)   08/11/19 5' 9 (1.753 m)   11/07/17 5' 9 (1.753 m)     Temp Readings from Last 3 Encounters:   11/08/20 97.9 ??F (36.6 ??C) (Oral)   11/07/17 98.1 ??F (36.7 ??C)   09/12/17 98.3 ??F (36.8 ??C)     BP Readings from Last 3 Encounters:   11/08/20 132/83   08/11/19 122/84   09/04/18 132/77     Pulse Readings from Last 3 Encounters:   11/08/20 63   08/11/19 74   09/04/18 65     @LASTSAO2 (3)@    Physical Exam     Airway:     Mallampati: II  Neck ROM: limited    Dental:   - No obvious cracked, loose, chipped, or missing teeth.     Pulmonary:   - normal exam     Cardiovascular:  - normal exam     Neuro/Musculoskeletal/Psych:  - normal neurological exam.         Abdominal:    - normal exam    Current OB Status:       Other Findings:        Laboratory Data     Lab Results   Component Value Date    WBC 6.2 11/02/2020    HGB 12.7 11/02/2020    HCT 37.3  11/02/2020    MCV 100.9 (H) 11/02/2020    PLT 294 11/02/2020       No results found for: [facility]    Lab Results   Component Value Date    GLUCOSE 94 12/07/2015    BUN 9 12/07/2015    CO2 29 12/07/2015    CREATININE 0.67 12/07/2015    K 4.3 12/07/2015    NA 142 12/07/2015    CL 108 12/07/2015    CALCIUM 8.6 12/07/2015    ALBUMIN 3.6 12/07/2015    PROT 5.7 (L) 12/07/2015    ALKPHOS 51 12/07/2015    ALT 8 12/07/2015    AST 12 (L) 12/07/2015    BILITOT 0.4 12/07/2015       No results found for: PTT, INR    No results found for: PREGTESTUR, PREGSERUM, HCG, HCGQUANT    Anesthesia Plan     ASA 2         Female and current non-smoker    Anesthesia Type:  general.      PONV Risk Factors: female, current non-smoker,                    Anesthetic plan and risks discussed with patient.    Plan, alternatives, and risks of anesthesia, including death, have been explained to and discussed with the patient/legal guardian.  By my assessment, the patient/legal guardian understands and agrees.  Scenario presented in detail.  Questions answered.  Use of blood products discussed with patient who consented to blood products.       Plan discussed with CRNA.

## 2020-11-08 NOTE — Unmapped (Signed)
Anesthesia Extubation Criteria:    Airway Device: endotracheal tube    Emergence Details:      Smooth      _x_      Stormy       __       Prolonged   __     Extubation Criteria:      Motor strength intact       _x_      Follows commands        _x_      Good airway reflexes      _x_      OP suctioned                  _x_        Follows commands:  Yes     Patient extubated:  Yes

## 2020-11-08 NOTE — Unmapped (Addendum)
Cystoscopy, Care After  Refer to this sheet in the next few weeks. These instructions provide you with information about caring for yourself after your procedure. Your health care provider may also give you more specific instructions. Your treatment has been planned according to current medical practices, but problems sometimes occur. Call your health care provider if you have any problems or questions after your procedure.  What can I expect after the procedure?  After the procedure, it is common to have:  ?? Mild pain when you urinate. Pain should stop within a few minutes after you urinate. This may last for up to 1 week.  ?? A small amount of blood in your urine for several days.  ?? Feeling like you need to urinate but producing only a small amount of urine.  Follow these instructions at home:    Medicines  ?? Take over-the-counter and prescription medicines only as told by your health care provider.  ?? If you were prescribed an antibiotic medicine, take it as told by your health care provider. Do not stop taking the antibiotic even if you start to feel better.  General instructions    ?? Return to your normal activities as told by your health care provider. Ask your health care provider what activities are safe for you.  ?? Do not drive for 24 hours if you received a sedative.  ?? Watch for any blood in your urine. If the amount of blood in your urine increases, call your health care provider.  ?? Follow instructions from your health care provider about eating or drinking restrictions.  ?? If a tissue sample was removed for testing (biopsy) during your procedure, it is your responsibility to get your test results. Ask your health care provider or the department performing the test when your results will be ready.  ?? Drink enough fluid to keep your urine clear or pale yellow.  ?? Keep all follow-up visits as told by your health care provider. This is important.  Contact a health care provider if:  ?? You have pain that  gets worse or does not get better with medicine, especially pain when you urinate.  ?? You have difficulty urinating.  Get help right away if:  ?? You have more blood in your urine.  ?? You have blood clots in your urine.  ?? You have abdominal pain.  ?? You have a fever or chills.  ?? You are unable to urinate.  This information is not intended to replace advice given to you by your health care provider. Make sure you discuss any questions you have with your health care provider.  Document Released: 06/29/2005 Document Revised: 05/17/2016 Document Reviewed: 10/27/2015  Elsevier Interactive Patient Education ?? 2017 [employer].  [place], Care After  This sheet gives you information about how to care for yourself after your procedure. Your health care provider may also give you more specific instructions. If you have problems or questions, contact your health care provider.  What can I expect after the procedure?  After the procedure, it is common to have:  ?? Slight bleeding or fluid coming from your incision.  ?? Mild pain.  ?? Swelling.  Follow these instructions at home:  Incision care    ?? Follow instructions from your health care provider about how to take care of your incision.  ?? Leave stitches (sutures), skin glue, or adhesive strips in place. These skin closures may need to stay in place for 2 weeks or longer. If  adhesive strip edges start to loosen and curl up, you may trim the loose edges. Do not remove adhesive strips completely unless your health care provider tells you to do that.  ?? Check your incision area every day for signs of infection. Check for:  ? Redness, swelling, or more pain.  ? More fluid or blood.  ? Warmth.  ? Vaginal discharge or a bad smell.  Medicines  ?? Apply antibiotic ointment as told by your health care provider.  ?? Take over-the-counter and prescription medicines only as told by your health care provider.  Driving  ?? Do not drive or use heavy machinery while taking prescription pain  medicine.  ?? Do not drive for 24 hours if you were given a medicine to help you relax (sedative) during your procedure.  Activity  ?? For 4 weeks, or as long as directed:  ? Do not place anything in your vagina, including tampons.  ? Do not have vaginal sex.  ? Do not wear tight clothing, especially tight underwear.  ?? Return to your normal activities as told by your health care provider. Ask your health care provider what activities are safe for you.  Bathing  ?? Do not take baths, swim, or use a hot tub until your health care provider approves. Ask your health care provider if you may take showers. You may only be allowed to take sponge baths.  ?? Use mild soap and water to clean your incision area as needed.  Managing pain and swelling  ?? To reduce pain or swelling, put ice on the affected area:  ? Put ice in a plastic bag.  ? Place the bag between your dressing and your underwear. Make sure your underwear holds the ice bag in place.  ? Leave the ice on for 20 minutes, 2-3 times a day.  ?? When lying down, put a pillow under your hips. This will help to reduce swelling.  General instructions  ?? Do not use any products that contain nicotine or tobacco, such as cigarettes and e-cigarettes. These can delay healing and increase the risk that your incision will open. If you need help quitting, ask your health care provider.  ?? To prevent or treat constipation while you are taking prescription pain medicine, your health care provider may recommend that you:  ? Drink enough fluid to keep your urine pale yellow.  ? Take over-the-counter or prescription medicines.  ? Eat foods that are high in fiber, such as fresh fruits and vegetables, whole grains, and beans.  ? Limit foods that are high in fat and processed sugars, such as fried and sweet foods.  ?? Keep all follow-up visits as told by your health care provider. This is important.  Contact a health care provider if:  ?? You have chills or a fever.  ?? You have redness,  swelling, or more pain around your incision.  ?? You have more fluid or blood coming from your incision.  ?? Your incision feels warm to the touch.  ?? You have vaginal discharge or a bad smell coming from your incision area.  ?? You have vaginal dryness or pain during sex after 4 weeks.  Get help right away if:  ?? You have bleeding that does not stop after 10 minutes of applying firm pressure.  Summary  ?? For the first few days after labiaplasty, you may have some mild pain and swelling.  ?? For as long as directed by your health care provider, do not  place anything in your vagina, have vaginal sex, or wear tight clothing.  This information is not intended to replace advice given to you by your health care provider. Make sure you discuss any questions you have with your health care provider.  Document Revised: 10/03/2018 Document Reviewed: 06/18/2017  Elsevier Patient Education ?? 2021 [employer]vier Inc[employer]      PATIENT INSTRUCTIONS  POST-ANESTHESIA    IMMEDIATELY FOLLOWING SURGERY:  Do not drive or operate machinery for the first twenty four hours after surgery.  Do not make any important decisions for twenty four hours after surgery or while taking narcotic pain medications or sedatives. Do not sign any legal documents. Do not drink any alcoholic beverages for 24 hours after surgery or while you are taking pain medicine.  If you develop severe nausea and vomiting or a severe headache please notify your doctor immediately.    DIET: Start off with clear liquids and proceed to a light diet (ie soup,crackers, light sandwich.) Advance to a regular diet. If you become nauseated go back to clear liquids until you feel better. Please stay away from greasy and spicy foods the day of surgery.      FOLLOW-UP:  Please make an appointment with your surgeon as instructed. You do not need to follow up with anesthesia unless specifically instructed to do so.      QUESTIONS?:  Please feel free to call your physician or the hospital operator if  you have any questions, and they will be happy to assist you.

## 2020-11-08 NOTE — Unmapped (Signed)
Pt up to BR with RN assist, pt able to void on her own right away. Small amt blood noted on pad. Pt feels pain is tolerable at this time.     1850: D/C instructions reviewed with pt and husband, both verbalized understanding, IV removed, pt d/c'd in stable condition, pain minimal and tolerable.

## 2020-11-08 NOTE — Unmapped (Signed)
Called Dr. Read Drivers and informed him of pt vomiting following po meds being given. Pepcid 20 mg IV ordered.

## 2020-11-08 NOTE — Unmapped (Signed)
Pt had emesis following administration of oral meds. 1 small white pill noted in emesis. Pt says she has trouble taking pills on an empty stomach

## 2020-11-08 NOTE — Unmapped (Signed)
PERINEORRHAPHY, TRANS-VAGINAL TAPE;, CYSTOSCOPY, LABIAPLASTY  Brief Op Note  Renea L Trenkamp  11/08/2020      Pre-op Diagnosis: STRESS INCONTINENCE  LABIAL HYPERTROPHY       Post-op Diagnosis: Same    Procedure(s):  PERINEORRHAPHY, TRANS-VAGINAL TAPE;  CYSTOSCOPY  LABIAPLASTY      Surgeon(s):  Lamona Curl, MD    Anesthesia: General    Staff:   Circulator: Kurtis Bushman, RN; Warnell Forester Helton-Francis, RN  Scrub Person: Jannifer Hick  Resident: Mauri Brooklyn, MD    Estimated Blood Loss: less than 50 mL                 Specimens:            Drains:   IUC (Coila Wardell) Non-latex;Straight-tip (Active)   Number of days: 0         There were no complications unless listed below.        Pamala Duffel Izela Altier     Date: 11/08/2020  Time: 4:58 PM

## 2020-12-07 ENCOUNTER — Ambulatory Visit: Payer: PRIVATE HEALTH INSURANCE

## 2020-12-09 MED ORDER — valACYclovir (VALTREX) 1000 MG tablet
1000 | ORAL_TABLET | ORAL | 0 refills | Status: AC
Start: 2020-12-09 — End: 2021-01-02

## 2020-12-14 MED ORDER — ALPRAZolam (XANAX) 0.25 MG tablet
0.25 | ORAL_TABLET | Freq: Every day | ORAL | 0 refills | Status: AC | PRN
Start: 2020-12-14 — End: 2021-01-04

## 2020-12-14 NOTE — Unmapped (Signed)
lmtcb 12/22

## 2020-12-14 NOTE — Unmapped (Signed)
The patient has been notified of this information and all questions answered.

## 2020-12-14 NOTE — Unmapped (Signed)
Patient called asking for script for   ALPRAZolam Prudy Feeler) 0.25 MG tablet  Is traveling.   10 tablets   thanks

## 2021-01-02 ENCOUNTER — Ambulatory Visit: Admit: 2021-01-02 | Payer: PRIVATE HEALTH INSURANCE

## 2021-01-02 ENCOUNTER — Other Ambulatory Visit: Admit: 2021-01-02 | Payer: PRIVATE HEALTH INSURANCE

## 2021-01-02 DIAGNOSIS — Z1211 Encounter for screening for malignant neoplasm of colon: Secondary | ICD-10-CM

## 2021-01-02 DIAGNOSIS — Z01818 Encounter for other preprocedural examination: Secondary | ICD-10-CM

## 2021-01-02 LAB — CBC
Hematocrit: 39 % (ref 35.0–45.0)
Hemoglobin: 12.8 g/dL (ref 11.7–15.5)
MCH: 33.6 pg — ABNORMAL HIGH (ref 27.0–33.0)
MCHC: 32.8 g/dL (ref 32.0–36.0)
MCV: 102.3 fL — ABNORMAL HIGH (ref 80.0–100.0)
MPV: 7.3 fL — ABNORMAL LOW (ref 7.5–11.5)
Platelets: 395 10*3/uL (ref 140–400)
RBC: 3.81 10*6/uL (ref 3.80–5.10)
RDW: 17.4 % — ABNORMAL HIGH (ref 11.0–15.0)
WBC: 7.2 10*3/uL (ref 3.8–10.8)

## 2021-01-02 LAB — COMPREHENSIVE METABOLIC PANEL
ALT: 9 U/L (ref 7–52)
AST: 21 U/L (ref 13–39)
Albumin: 4 g/dL (ref 3.5–5.7)
Alkaline Phosphatase: 85 U/L (ref 36–125)
Anion Gap: 10 mmol/L (ref 3–16)
BUN: 10 mg/dL (ref 7–25)
CO2: 29 mmol/L (ref 21–33)
Calcium: 9.7 mg/dL (ref 8.6–10.3)
Chloride: 104 mmol/L (ref 98–110)
Creatinine: 0.56 mg/dL (ref 0.60–1.30)
Glucose: 73 mg/dL (ref 70–100)
Osmolality, Calculated: 294 mOsm/kg (ref 278–305)
Potassium: 5 mmol/L (ref 3.5–5.3)
Sodium: 143 mmol/L (ref 133–146)
Total Bilirubin: 0.3 mg/dL (ref 0.0–1.5)
Total Protein: 6.3 g/dL (ref 6.4–8.9)
eGFR AA CKD-EPI: >90 See note.
eGFR NONAA CKD-EPI: >90 See note.

## 2021-01-02 LAB — VITAMIN B12: Vitamin B-12: 112 pg/mL (ref 180–914)

## 2021-01-02 LAB — VITAMIN D 25 HYDROXY: Vit D, 25-Hydroxy: 41.8 ng/mL (ref 30.0–100.0)

## 2021-01-02 MED ORDER — valACYclovir (VALTREX) 1000 MG tablet
1000 | ORAL_TABLET | ORAL | 0 refills | Status: AC
Start: 2021-01-02 — End: 2021-03-20

## 2021-01-02 MED ORDER — sulfamethoxazole-trimethoprim (BACTRIM DS) 800-160 mg per tablet
800-160 | ORAL_TABLET | ORAL | 0 refills | Status: AC
Start: 2021-01-02 — End: 2021-11-24

## 2021-01-02 NOTE — Unmapped (Addendum)
Student presented to me in the room.  Breast augmentation  Dr. Ronnell Freshwater patient  1/26    She has form preop  See form  EKG normal.  CBC, cmp pending    [place] cologuard

## 2021-01-02 NOTE — Unmapped (Signed)
[facility]  [facility]  [address]  [place] [address]    Name:  Sherry Compton Date of Birth: 14-Oct-1966 (55 y.o.)   MRN: [id number]    Date of Service:  01/02/2021     Subjective:     Chief Complaint   Patient presents with   ??? Annual Exam     pt. declines flu vaccine.     History of Present Illness:  Sherry Compton is a(n) 55 y.o. female here today for the following:   Ms. Sherry Compton is a 55 yo female presenting for annual physical and pre-op appointment    #Pre-op  - breast augmentation, lift, switching saline implant to silicone; also having a brachioplasty on both arms arm  - surgeon: Dr. Presley Compton, surgical center in [place]  - no stay in the hospital after  - surgery on 01/18/2021  - had mammography done already  - needs forms signed    No history of heart problems, CAD, CHF or heart attack, no history of blood clots, bleeding disorders, sleep apnea, or diabetes    No history or family history of reactions to anesthesia  Can easily climb 2 flights of stairs    Past surgeries   - gastric sleeve, 5 years ago  - breast augmentation, 1998  - fixed breast augmentation and lift in 2008  - no history of adverse reactions to surgery    Social  -  1-2 cigarettes per week, didn't drink or smoke for 28 years  - glass of wine per week  - no recreational drugs    Medications / Allergies to them   - refill on bactrim, taken for UTIs after sex, prn  - valacyclovir refill  - naproxen  - no allergies to medications    #Annual physical  No new concerns  PMH - no chronic diseases  Sexually active - yes, no concerns  Work/trips/holiday - work two part time jobs, 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 ALPRAZolam (XANAX) 0.25 MG tablet Take 1 tablet (0.25 mg total) by mouth daily as needed for Sleep or Anxiety for up to 5 doses. Indications: anxiety, fear of flying 5 tablet 0   ??? docusate sodium (COLACE) 100 MG capsule Take 1 capsule (100 mg total) by mouth 2 times a day. 60 capsule 0   ??? sulfamethoxazole-trimethoprim (BACTRIM DS) 800-160 mg per tablet TAKE 1 TABLET BY MOUTH DAILY AS NEEDED AFTER SEXUAL INTERCOURSE TO PREVENT UTI 30 tablet 0   ??? valACYclovir (VALTREX) 1000 MG tablet TAKE 2 TABLETS(2,000MG ) BY MOUTH EVERY 12 HOURS FOR 1 DAY FOR COLD SORE 28 tablet 0     No current facility-administered medications for this visit.      Review of Systems   Constitutional: Negative for chills and fever.   HENT: Negative for hearing loss, sore  throat and trouble swallowing.    Eyes: Negative for pain and redness.   Respiratory: Negative for cough, shortness of breath and wheezing.    Cardiovascular: Negative for chest pain, palpitations and leg swelling.   Gastrointestinal: Negative for abdominal pain, constipation, diarrhea, nausea and vomiting.   Genitourinary: Negative for difficulty urinating and dysuria.   Musculoskeletal: Negative for back pain and neck pain.   Skin: Negative for rash and wound.   Neurological: Negative for dizziness, seizures, numbness and headaches.            Objective:     Vitals:    01/02/21 1435   BP: 121/86   Pulse: 96   SpO2: 100%   Weight: 166 lb 3.2 oz (75.4 kg)             Body mass index is 24.54 kg/m??.    Physical Exam  Vitals reviewed.   Constitutional:       Appearance: Normal appearance.   HENT:      Head: Normocephalic and atraumatic.      Mouth/Throat:      Mouth: Mucous membranes  are moist.      Pharynx: No oropharyngeal exudate or posterior oropharyngeal erythema.      Comments: mallampati 3  Eyes:      Extraocular Movements: Extraocular movements intact.      Conjunctiva/sclera: Conjunctivae normal.   Cardiovascular:      Rate and Rhythm: Normal rate and regular rhythm.      Pulses: Normal pulses.      Heart sounds: Normal heart sounds. No murmur heard.  No gallop.    Pulmonary:      Effort: Pulmonary effort is normal. No respiratory distress.      Breath sounds: Normal breath sounds. No wheezing or rales.   Abdominal:      General: Bowel sounds are normal. There is no distension.      Palpations: Abdomen is soft.      Tenderness: There is no abdominal tenderness. There is no guarding.   Musculoskeletal:      Cervical back: Neck supple.   Skin:     General: Skin is warm and dry.      Capillary Refill: Capillary refill takes less than 2 seconds.   Neurological:      General: No focal deficit present.      Mental Status: She is alert and oriented to person, place, and time.   Psychiatric:         Mood and Affect: Mood normal.         Behavior: Behavior normal.         Thought Content: Thought content normal.                   Assessment/Plan:   Ms. Sherry Compton is a 55 yo female here for annual physical and pre-op exam.    #Pre-op for surgery  - EKG, CBC, CMP as requested by surgeon  - signed mammography form  - clear for surgery    #Health maintenance  - continue eating healthy diet and exercising  - lipid panel, hemoglobin A1c labs done at next visit for annual physical  - valacyclovir and bactrim refill  - will get COVID booster once she is elligible  - not interested in influenza or shingles vaccine today  - order cologuard test  - return for yearly physical  Sherry Compton

## 2021-01-05 MED ORDER — cyanocobalamin (VITAMIN B-12) injection 1,000 mcg
1000 | INTRAMUSCULAR | Status: AC
Start: 2021-01-05 — End: 2021-02-06

## 2021-03-20 MED ORDER — valACYclovir (VALTREX) 1000 MG tablet
1000 | ORAL_TABLET | ORAL | 0 refills | Status: AC
Start: 2021-03-20 — End: 2021-04-10

## 2021-04-02 ENCOUNTER — Emergency Department (HOSPITAL_COMMUNITY): Payer: 59

## 2021-04-02 ENCOUNTER — Encounter (HOSPITAL_COMMUNITY): Payer: Self-pay | Admitting: Emergency Medicine

## 2021-04-02 ENCOUNTER — Emergency Department (HOSPITAL_COMMUNITY)
Admission: EM | Admit: 2021-04-02 | Discharge: 2021-04-02 | Disposition: A | Discharge status: Home / Self Care | Payer: 59 | Attending: Emergency Medicine | Admitting: Emergency Medicine

## 2021-04-02 DIAGNOSIS — R410 Disorientation, unspecified: Secondary | ICD-10-CM | POA: Insufficient documentation

## 2021-04-02 DIAGNOSIS — F10229 Alcohol dependence with intoxication, unspecified: Secondary | ICD-10-CM | POA: Insufficient documentation

## 2021-04-02 DIAGNOSIS — F1020 Alcohol dependence, uncomplicated: Secondary | ICD-10-CM

## 2021-04-02 DIAGNOSIS — Y900 Blood alcohol level of less than 20 mg/100 ml: Secondary | ICD-10-CM | POA: Diagnosis not present

## 2021-04-02 DIAGNOSIS — R4701 Aphasia: Secondary | ICD-10-CM | POA: Diagnosis not present

## 2021-04-02 HISTORY — DX: Alcohol abuse, uncomplicated: F10.10

## 2021-04-02 LAB — COMPREHENSIVE METABOLIC PANEL
ALT: 23 U/L (ref 0–44)
AST: 18 U/L (ref 15–41)
Albumin: 3.2 g/dL — ABNORMAL LOW (ref 3.5–5.0)
Alkaline Phosphatase: 74 U/L (ref 38–126)
Anion gap: 6 (ref 5–15)
BUN: 7 mg/dL (ref 6–20)
CO2: 26 mmol/L (ref 22–32)
Calcium: 8.9 mg/dL (ref 8.9–10.3)
Chloride: 103 mmol/L (ref 98–111)
Creatinine, Ser: 0.57 mg/dL (ref 0.44–1.00)
GFR, Estimated: >60 mL/min (ref >60)
Glucose, Bld: 93 mg/dL (ref 70–99)
Potassium: 3.8 mmol/L (ref 3.5–5.1)
Sodium: 135 mmol/L (ref 135–145)
Total Bilirubin: 0.5 mg/dL (ref 0.3–1.2)
Total Protein: 6.1 g/dL — ABNORMAL LOW (ref 6.5–8.1)

## 2021-04-02 LAB — CBC WITH DIFFERENTIAL/PLATELET
Abs Immature Granulocytes: 0.01 10*3/uL (ref 0.00–0.07)
Basophils Absolute: 0 10*3/uL (ref 0.0–0.1)
Basophils Relative: 1 %
Eosinophils Absolute: 0.1 10*3/uL (ref 0.0–0.5)
Eosinophils Relative: 2 %
HCT: 37.1 % (ref 36.0–46.0)
Hemoglobin: 12.5 g/dL (ref 12.0–15.0)
Immature Granulocytes: 0 %
Lymphocytes Relative: 26 %
Lymphs Abs: 1.6 10*3/uL (ref 0.7–4.0)
MCH: 35.5 pg — ABNORMAL HIGH (ref 26.0–34.0)
MCHC: 33.7 g/dL (ref 30.0–36.0)
MCV: 105.4 fL — ABNORMAL HIGH (ref 80.0–100.0)
Monocytes Absolute: 0.7 10*3/uL (ref 0.1–1.0)
Monocytes Relative: 11 %
Neutro Abs: 3.7 10*3/uL (ref 1.7–7.7)
Neutrophils Relative %: 60 %
Platelets: 295 10*3/uL (ref 150–400)
RBC: 3.52 MIL/uL — ABNORMAL LOW (ref 3.87–5.11)
RDW: 18.7 % — ABNORMAL HIGH (ref 11.5–15.5)
WBC: 6.2 10*3/uL (ref 4.0–10.5)
nRBC: 0 % (ref 0.0–0.2)

## 2021-04-02 LAB — I-STAT BETA HCG BLOOD, ED (MC, WL, AP ONLY): I-stat hCG, quantitative: <5 m[IU]/mL (ref <5)

## 2021-04-02 LAB — I-STAT CHEM 8, ED
BUN: 10 mg/dL (ref 6–20)
Calcium, Ion: 1.02 mmol/L — ABNORMAL LOW (ref 1.15–1.40)
Chloride: 103 mmol/L (ref 98–111)
Creatinine, Ser: 0.5 mg/dL (ref 0.44–1.00)
Glucose, Bld: 86 mg/dL (ref 70–99)
HCT: 39 % (ref 36.0–46.0)
Hemoglobin: 13.3 g/dL (ref 12.0–15.0)
Potassium: 6.2 mmol/L — ABNORMAL HIGH (ref 3.5–5.1)
Sodium: 133 mmol/L — ABNORMAL LOW (ref 135–145)
TCO2: 24 mmol/L (ref 22–32)

## 2021-04-02 LAB — RAPID URINE DRUG SCREEN, HOSP PERFORMED
Amphetamines: NOT DETECTED
Barbiturates: NOT DETECTED
Benzodiazepines: NOT DETECTED
Cocaine: NOT DETECTED
Opiates: NOT DETECTED
Tetrahydrocannabinol: POSITIVE — AB

## 2021-04-02 LAB — ETHANOL: Alcohol, Ethyl (B): <10 mg/dL (ref <10)

## 2021-04-02 MED ORDER — LORAZEPAM 2 MG/ML IJ SOLN
1.0000 mg | Freq: Once | INTRAMUSCULAR | Status: AC
  Administered 2021-04-02: 1 mg via INTRAVENOUS
  Filled 2021-04-02: qty 1

## 2021-04-02 MED ORDER — FOLIC ACID 1 MG PO TABS
1.0000 mg | ORAL_TABLET | Freq: Once | ORAL | Status: AC
  Administered 2021-04-02: 1 mg via ORAL
  Filled 2021-04-02: qty 1

## 2021-04-02 MED ORDER — THIAMINE HCL 100 MG PO TABS: 100.0000 mg | ORAL_TABLET | Freq: Every day | ORAL | 0 refills | Status: AC

## 2021-04-02 MED ORDER — FOLIC ACID 1 MG PO TABS: 1.0000 mg | ORAL_TABLET | Freq: Every day | ORAL | 0 refills | Status: AC

## 2021-04-02 MED ORDER — THIAMINE HCL 100 MG/ML IJ SOLN
100.0000 mg | Freq: Once | INTRAMUSCULAR | Status: AC
  Administered 2021-04-02: 100 mg via INTRAVENOUS
  Filled 2021-04-02: qty 2

## 2021-04-02 MED ORDER — SODIUM CHLORIDE 0.9 % IV BOLUS
500.0000 mL | Freq: Once | INTRAVENOUS | Status: AC
  Administered 2021-04-02: 500 mL via INTRAVENOUS

## 2021-04-02 NOTE — Discharge Instructions (Addendum)
Avoid all forms of alcohol.  Try to drink plenty of water every day.  Eat a well-balanced diet.  Take the prescription vitamins as prescribed to improve your condition.  Return here if needed for problems.  If you stay in [place], find a primary care doctor to see for checkup in the next week or 2.

## 2021-04-02 NOTE — ED Notes (Signed)
Pt keeps taking off her monitoring equipment and gown, stating that she wants to leave. Pt visitor is getting upset, asking for results, saying that she will take her home and just wants her results. MD notified.
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
## 2021-04-02 NOTE — ED Notes (Signed)
Patient refused discharge vitals.
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
## 2021-04-10 MED ORDER — valACYclovir (VALTREX) 1000 MG tablet
1000 | ORAL_TABLET | ORAL | 0 refills | Status: AC
Start: 2021-04-10 — End: 2021-11-24

## 2021-06-05 ENCOUNTER — Ambulatory Visit: Admit: 2021-06-05 | Payer: PRIVATE HEALTH INSURANCE

## 2021-06-05 DIAGNOSIS — Z7689 Persons encountering health services in other specified circumstances: Secondary | ICD-10-CM

## 2021-06-05 NOTE — Unmapped (Signed)
[facility]  [facility]  [address], [place] [address]    Name:  Sherry Compton Date of Birth: [birth year] (55 y.o.)   MRN: [id number]    Date of Service:  06/05/2021    Subjective   HPI:     Chief Complaint   Patient presents with   ??? Paperwork     Work note.        History of Present Illness:  Sherry Compton is a(n) 55 y.o. female here today for the following:   Here today for RTW letter. Works in [employer] in [employer]. Stocking shelves + working Ambulance person. Had suspension bladder neck + transvaginal sling 11/08/2020 here at [facility] followed by several cosmetic surgeries including breast augmentation/reduction + tummy tuck this past spring w/ Dr Presley Raddle (records not available for review). She is back to her usual state of health and feels that she can complete her work duties without issues and does not request accomodation. She has been cleared by her surgeons per patient.        ROS:   Review of Systems    The following portions of the patient's history were reviewed and updated as appropriate:  past medical history, past surgical history, current medications, allergies, social history, family history and immunization history       Objective:     Vitals:    06/05/21 0848   BP: 126/87   Pulse: 83   SpO2: 98%   Height: 5' 9 (1.753 m)   Weight: 159 lb 3.2 oz (72.2 kg)   BMI (Calculated): 23.5     Body mass index is 23.51 kg/m??.        Physical Exam  Vitals reviewed.   Constitutional:       Appearance: Normal appearance.   HENT:      Head: Normocephalic and atraumatic.   Eyes:      Extraocular Movements: Extraocular movements intact.      Pupils: Pupils are equal, round, and reactive to light.   Pulmonary:      [place]: Pulmonary effort is normal.   Skin:     General: Skin is warm and dry.      Capillary Refill: Capillary refill takes less than 2 seconds.      Findings: Lesion (incisions healing appropriately) present.    Neurological:      General: No focal deficit present.      Mental Status: She is alert and oriented to person, place, and time.   Psychiatric:         Mood and Affect: Mood normal.         Behavior: Behavior normal.         Thought Content: Thought content normal.         Judgment: Judgment normal.                 Assessment/Plan:     Ninel was seen today for paperwork.    Diagnoses and all orders for this visit:    Return to work evaluation (Primary)  Comments:  Appropriate to return to work. Encouraged f/u with her surgical team as previously recommended and AWV w/ PCP.           BMI is normal.         Return for Annual physical ( ).         Mayford Knife, MD  Total time spent with patient was 12 minutes.  Greater than 50% of that time was spent counseling and coordinating the care of the patient.  Please see my assessment and plan for details.

## 2021-07-22 ENCOUNTER — Emergency Department: Admit: 2021-07-23 | Payer: PRIVATE HEALTH INSURANCE

## 2021-07-22 DIAGNOSIS — R112 Nausea with vomiting, unspecified: Secondary | ICD-10-CM

## 2021-07-22 MED ORDER — lactated Ringers 1,000 mL IV fluid
Freq: Once | INTRAVENOUS | Status: AC
Start: 2021-07-22 — End: 2021-07-22
  Administered 2021-07-23: 02:00:00 1000 mL via INTRAVENOUS

## 2021-07-22 MED ORDER — ondansetron (ZOFRAN) injection 8 mg
4 | Freq: Once | INTRAMUSCULAR | Status: AC
Start: 2021-07-22 — End: 2021-07-22
  Administered 2021-07-23: 02:00:00 8 mg via INTRAVENOUS

## 2021-07-22 MED FILL — ONDANSETRON HCL (PF) 4 MG/2 ML INJECTION SOLUTION: 4 4 mg/2 mL | INTRAMUSCULAR | Qty: 4

## 2021-07-22 NOTE — Unmapped (Signed)
Bed: A05W  Expected date:   Expected time:   Means of arrival:   Comments:  54F, OD, patient given 4mg  narcan, GCS 15, patient stated she did cocaine earlier and may have been laced with something

## 2021-07-22 NOTE — Unmapped (Signed)
Family at bedside.

## 2021-07-22 NOTE — Unmapped (Signed)
Patient resting quietly in bed family at bedside.

## 2021-07-22 NOTE — Unmapped (Signed)
[place] ED Note    Date of service:  07/22/2021    Reason for Visit: Overdose-Accidental      Patient History     HPI:  Sherry Compton is a 55 y.o. female with PMHx as below  who presents with a chief complaint of severe lightheadedness, nausea, and vomiting after snorting a small line of cocaine 2 hours ago.  The patient states she does not normally use cocaine.  She does drink alcohol but did not drink anything tonight and denies any other recreational drug use or medication ingestion today.  She states that immediately after doing the line, she felt very lightheaded and began to vomit.  She still feels quite nauseated but denies any chest pain, shortness of breath, headache, or other complaints.      Aside from the above, patient denies any aggravating or alleviating factors or associated symptoms.       Past Medical History:   Diagnosis Date   ??? Anxiety    ??? Wears glasses     readers       Past Surgical History:   Procedure Laterality Date   ??? AUGMENTATION BREAST ENDOSCOPIC     ??? CYSTOSCOPY N/A 11/08/2020    Procedure: CYSTOSCOPY;  Surgeon: Lamona Curl, MD;  Location: [facility] OR;  Service: Gynecology;  Laterality: N/A;   ??? ENDOMETRIAL ABLATION     ??? gastric sleeve bariatric surgeyr by dr Clementeen Graham     ??? REDUCTION BREAST  01/18/2021   ??? SUSPENSION BLADDER NECK TRANS-VAGINAL SLING N/A 11/08/2020    Procedure: PERINEORRHAPHY, TRANS-VAGINAL TAPE;;  Surgeon: Lamona Curl, MD;  Location[facility] OR;  Service: Gynecology;  Laterality: N/A;   ??? tummy tuck  04/16/2021   ??? VULVA SURGERY N/A 11/08/2020    Procedure: LABIAPLASTY;  Surgeon: Lamona Curl, MD;  Location: [facility] OR;  Service: Gynecology;  Laterality: N/A;       Sherry Compton  reports that she quit smoking about 28 years ago. She has never used smokeless tobacco. She reports that she does not drink alcohol and does not use drugs.    Previous Medications    DOCUSATE SODIUM (COLACE) 100 MG CAPSULE    Take 1 capsule (100 mg total)  by mouth 2 times a day.    SULFAMETHOXAZOLE-TRIMETHOPRIM (BACTRIM DS) 800-160 MG PER TABLET    One tablet after sex to prevent uti    VALACYCLOVIR (VALTREX) 1000 MG TABLET    TAKE 2 TABLETS(2000 MG) BY MOUTH EVERY 12 HOURS FOR 1 DAY FOR COLD SORE       Allergies:   Allergies as of 07/22/2021   ??? (No Known Allergies)       Review of Systems     ROS:  All other systems reviewed are negative except as mentioned in HPI.      Physical Exam     Vitals:    07/22/21 2258   BP: (!) 140/92   Pulse:    Resp:    Temp:    SpO2: 95%       Physical Exam  Constitutional:       Appearance: She is well-developed.      Comments: Vomiting into an emesis bag   HENT:      Head: Normocephalic and atraumatic.      Right Ear: External ear normal.      Left Ear: External ear normal.      Nose: Nose normal.   Eyes:      General: No scleral  icterus.     Conjunctiva/sclera: Conjunctivae normal.      Pupils: Pupils are equal, round, and reactive to light.   Cardiovascular:      Rate and Rhythm: Normal rate and regular rhythm.      Heart sounds: Normal heart sounds.   Pulmonary:      [place]: Pulmonary effort is normal. No respiratory distress.      Breath sounds: Normal breath sounds.   Abdominal:      General: There is no distension.      Palpations: Abdomen is soft.      Tenderness: There is no abdominal tenderness.   Musculoskeletal:         General: No tenderness. Normal range of motion.   Skin:     General: Skin is warm and dry.      Findings: No rash.   Neurological:      General: No focal deficit present.      Mental Status: She is alert and oriented to person, place, and time.      Motor: No abnormal muscle tone.   Psychiatric:         Mood and Affect: Mood normal.         Behavior: Behavior normal.         Thought Content: Thought content normal.             Diagnostic Studies     Labs:    Labs Reviewed   CBC - Abnormal; Notable for the following components:       Result Value    RBC 3.75 (*)     Hemoglobin 10.3 (*)     Hematocrit 31.4  (*)     RDW 20.9 (*)     MPV 6.9 (*)     All other components within normal limits   DIFFERENTIAL - Abnormal; Notable for the following components:    Basophils Relative 1.5 (*)     All other components within normal limits   HIGH SENSITIVITY TROPONIN    Narrative:     Please draw 60 minutes after the time the first troponin is drawn.   BASIC METABOLIC PANEL   MAGNESIUM   HIGH SENSITIVITY TROPONIN   ED ECG 12-LEAD (MUSE)    Narrative:     Ventricular Rate:  67  BPM  Atrial Rate:  67  BPM  P-R Interval:  178  ms  QRS Duration:  106  ms  QT:  434  ms  QTc:  458  ms  P Axis:  67  degrees  R Axis:  40  degrees  T Axis:  39  degrees  Diagnosis Line:  NORMAL SINUS RHYTHM ^ NORMAL ECG       Radiology:    X-ray Portable Chest    (Results Pending)         Emergency Department Procedures     Procedures      ED Course and MDM     Hanaan L Sison is a 55 y.o. female with a history and presentation as described above in HPI.  All management and disposition plans were discussed and agreed upon. Appropriate labs and diagnostic studies were reviewed as they were made available. Pertinent laboratory studies in medical decision making are listed below.       This patient presents with history and exam as above.  Differential diagnosis is concerning for ACS, pneumothorax, dysrhythmia, or more likely acute drug reaction. Given normal vital signs and physical exam and overall nontoxic  appearance, I have low suspicion for aortic dissection or Boerhaave's.  EKG demonstrates normal sinus rhythm with no signs of acute ischemia.  The patient will be treated with a liter of LR and 8 mg of Zofran IV, and chest x-ray and lab work-up will be pursued to rule out signs of ACS, anemia, pneumothorax, pneumonia, pleural effusion, or significant electrolyte abnormalities.    At this time, I'm signing off and signing out care of this patient to my colleague, Dr. Tomasa Blase.  My colleague's responsibilities will include the following:  -Follow-up labs  and chest x-ray  -Reassess and disposition             Summary of Treatment in ED:    Medications   lactated Ringers 1,000 mL IV fluid (1,000 mLs Intravenous New Bag 07/22/21 2229)   ondansetron (ZOFRAN) injection 8 mg (8 mg Intravenous Given 07/22/21 2228)           Impression     1. Nausea and vomiting, unspecified vomiting type              Alvira Philips, MD  Resident  07/22/21 [phone]

## 2021-07-22 NOTE — Unmapped (Signed)
[facility]  Reassessment Note    Sherry Compton is a 55 y.o. female who presented to the emergency department on 07/22/2021. This patient was initially seen by an off-going provider and their care has been turned over to me. Please see the original provider's note for details regarding the initial history, physical exam and ED course.  At the time of turnover the following steps in the patient's evaluation were pending: repeat troponin, RFP, and CXR; re-evaluation and disposition    Briefly, this is a 55 year old female who presents after creation of a use of cocaine 2 hours prior to arrival, presenting with lightheadedness, nausea, and vomiting.    Exam: Well-nourished, well-developed, middle-aged female resting comfortably in bed, asleep on arrival to room but awakens to voice, no respiratory distress or respiratory depression, SPO2 100% on room air while sleeping and after waking up interacting with this clinician, regular rate and rhythm, alert and oriented x3, no focal neurodeficits.    Patient states that she is feeling much better and all her symptoms have resolved.  She is tolerating p.o. fluids and is able to ambulate on her own in the ED.  She will be discharged home with family who is at bedside.    Clinical Impression:  1. Nausea and vomiting, unspecified vomiting type     2. Cocaine abuse (CMS Dx)       Disposition:  Discharge    Patient Referred to:   Maree Erie, MD  [phone] [facility]  [address]  [place] [address]  [phone]    Schedule an appointment as soon as possible for a visit in 3 days  As needed      No future appointments.    Discharge Medications:  New Prescriptions    No medications on file       Patient/Family counseled at bedside and all questions answered. Verbal and written discharge instructions including return precautions were given. Patient advised to follow-up with primary care provider and/or referral clinic in the next 1-2 days. Usual and customary treatment  and medication side effects/warnings were discussed. Patient/guardian demonstrates capacity to understand these instructions and verbalized understanding and agreement with the plan of care. Patient was discharged in stable condition.

## 2021-07-22 NOTE — Unmapped (Signed)
Patient stated she snorted a line of what she thought was cocaine and stated it hit her immediately she felt sick and was going to vomit and dizzy, patients husband found her unresponsive and squad administered 2mg  narcan intranasally and 2mg  IV, patient has GCS 15 at this time and vomiting

## 2021-07-22 NOTE — Unmapped (Addendum)
Thank you for allowing me to participate in your care today.   - Dr. Nimesh Riolo    Please read and follow these and all other instructions you received at discharge.  Follow up with your primary care provider and with any referrals you were given.   Return to the Emergency Department for worsening symptoms or if you have:  Persistent fevers (greater than 101??F)  Chest pain, shortness of breath, passing out  Abdominal pain, uncontrollable vomiting, or inability to eat or drink  Severe headache, vision changes, difficulty speaking, difficulty moving your arms or legs  Any other concern you feel requires emergent medical attention  ____________________________________________________________________________________    Everyone needs a primary care provider with whom to build a relationship across multiple visits. This is important for follow-up care as well as routine medical care. If you do not have a primary care provider, please find one by calling your insurance company and asking for a list of covered providers in the local area or by using the provided clinic list.    Establishing care with a new provider can take several weeks. Don't be discouraged. This is similar for most people. Try to make an appointment to see your provider when you are well. If you have chronic health conditions, it can be helpful to make a list of these and then choose two that you would like to discuss with the provider during your first visit. This will help make the initial visit more productive and make you a partner in your health with the provider.  ____________________________________________________________________________________

## 2021-07-23 ENCOUNTER — Inpatient Hospital Stay
Admit: 2021-07-23 | Discharge: 2021-07-23 | Disposition: A | Discharge status: Home / Self Care | Payer: PRIVATE HEALTH INSURANCE | Attending: Student in an Organized Health Care Education/Training Program

## 2021-07-23 LAB — CBC
Hematocrit: 31.4 % (ref 35.0–45.0)
Hemoglobin: 10.3 g/dL (ref 11.7–15.5)
MCH: 27.4 pg (ref 27.0–33.0)
MCHC: 32.7 g/dL (ref 32.0–36.0)
MCV: 83.8 fL (ref 80.0–100.0)
MPV: 6.9 fL (ref 7.5–11.5)
Platelets: 291 10*3/uL (ref 140–400)
RBC: 3.75 10*6/uL (ref 3.80–5.10)
RDW: 20.9 % (ref 11.0–15.0)
WBC: 4.8 10*3/uL (ref 3.8–10.8)

## 2021-07-23 LAB — DIFFERENTIAL
Basophils Absolute: 72 /uL (ref 0–200)
Basophils Relative: 1.5 % (ref 0.0–1.0)
Eosinophils Absolute: 211 /uL (ref 15–500)
Eosinophils Relative: 4.4 % (ref 0.0–8.0)
Lymphocytes Absolute: 898 /uL (ref 850–3900)
Lymphocytes Relative: 18.7 % (ref 15.0–45.0)
Monocytes Absolute: 552 /uL (ref 200–950)
Monocytes Relative: 11.5 % (ref 0.0–12.0)
Neutrophils Absolute: 3067 /uL (ref 1500–7800)
Neutrophils Relative: 63.9 % (ref 40.0–80.0)
nRBC: 0 /100 WBC (ref 0–0)

## 2021-07-23 LAB — BASIC METABOLIC PANEL
Anion Gap: 5 mmol/L (ref 3–16)
BUN: 6 mg/dL (ref 7–25)
CO2: 26 mmol/L (ref 21–33)
Calcium: 8.8 mg/dL (ref 8.6–10.3)
Chloride: 105 mmol/L (ref 98–110)
Creatinine: 0.67 mg/dL (ref 0.60–1.30)
EGFR: >90
Glucose: 191 mg/dL (ref 70–100)
Osmolality, Calculated: 285 mOsm/kg (ref 278–305)
Potassium: 3.3 mmol/L (ref 3.5–5.3)
Sodium: 136 mmol/L (ref 133–146)

## 2021-07-23 LAB — HIGH SENSITIVITY TROPONIN
High Sensitivity Troponin: 5 ng/L (ref 0–14)
High Sensitivity Troponin: 5 ng/L (ref 0–14)

## 2021-07-23 LAB — MAGNESIUM: Magnesium: 1.9 mg/dL (ref 1.5–2.5)

## 2021-07-23 MED FILL — LACTATED RINGERS INTRAVENOUS SOLUTION: 1000.00 1000.00 mL | INTRAVENOUS | Qty: 1000

## 2021-07-23 NOTE — Unmapped (Signed)
Discharge order received.  Patient is stable at this time Vitals WNL.  IV removed with tip intact and no s/s of infection., pt tolerated well.  Discharge instructions reviewed, all concerns addressed. Patient verbalizes understanding and able to provide teach back.  Patient provided all belongings, discharge instruction, and education information.  Patient escorted to lobby with staff.  M Timi Reeser RN, BSN

## 2021-07-26 NOTE — Unmapped (Signed)
This is a notification of an ED/Admission Alert generated by [facility]. This patient visited the following location: [facility])Admit Date: 07/26/2021 10:19Visit Type: EmergencyChief complaint: Diagnosis:  ()Alert Category: Attending   Physician: Referring Physician: Consulting Physician: Copied Physician(s): [facility] is a not-for-profit corporation that was founded in 1997 as a community effort to enhance the ability to share health information electronically in the [place] area. Today, [facility] is one of the nation????????s largest and most financially sustainable regional health information exchange (HIE) organizations.

## 2021-07-27 NOTE — Unmapped (Signed)
This is a notification of a Discharge Alert generated by [facility]. This patient visited the following location: [facility] ([facility])Admit Date: 07/26/2021 10:19Discharge Date: 07/26/2021 18:25Visit Type: EmergencyChief complaint:   Postconcussional syndrome; Urinary tract infection, site not specifiedDiagnosis:  (F07.81; N39.0)Alert Category: Attending Physician: Criss Alvine DAVIDReferring Physician: Consulting Physician: HLUBEK, RANDALLCopied Physician(s): [facility] is a   not-for-profit corporation that was founded in 1997 as a community effort to enhance the ability to share health information electronically in the [employer] area. Today, [facility] is one of the nation????????s largest and most   financially sustainable regional health information exchange (HIE) organizations.

## 2021-08-01 NOTE — Unmapped (Signed)
Patient was recently in the hospital for a UTI and called in requesting an appointment to discuss getting on medication for depression     She has been having a really rough time lately and is also going through menopause     There are no appointments open until the end of September     Is there somewhere we can add her in or should we get her scheduled with another doctor within the next couple weeks?    Also lost phone so we will need to contact her on her husbands phone [phone]

## 2021-08-02 NOTE — Unmapped (Signed)
I can do August 31st 945    If too late for her any other provider is ok.

## 2021-08-03 NOTE — Unmapped (Signed)
Called patient but had to leave a voice mail  I informed her of the appointment and that I added her to the spot and to call and scheduled with a different provider if that did not work

## 2021-08-23 ENCOUNTER — Ambulatory Visit: Admit: 2021-08-23 | Discharge: 2021-08-28 | Payer: PRIVATE HEALTH INSURANCE

## 2021-08-23 DIAGNOSIS — F419 Anxiety disorder, unspecified: Secondary | ICD-10-CM

## 2021-08-23 MED ORDER — venlafaxine (EFFEXOR-XR) 37.5 MG 24 hr capsule
37.5 | ORAL_CAPSULE | Freq: Every day | ORAL | 0 refills | Status: AC
Start: 2021-08-23 — End: 2021-08-30

## 2021-08-23 NOTE — Unmapped (Signed)
Effexor for hot flashes    Get blood work  Follow-up in 4 weeks

## 2021-08-23 NOTE — Unmapped (Signed)
Chief Complaint   Patient presents with   ??? Medication Refill         Last alcohol drink   1 month ago  Was in DUI   seeing a psychologist Jonita Albee [phone]  Kids do not want to be around her    She is depressed  Has hot flashes sec to menopause  Little interest or pleasure in doing things: Nearly every day  Feeling down, depressed, or hopeless: Nearly every day  Trouble falling or staying asleep, or sleeping too much: Nearly every day  Feeling tired or having little energy: More than half the days  Poor appetite or overeating: Nearly every day  Feeling bad about yourself - or that you are a failure or have let yourself or your family down: Several days  Trouble concentrating on things, such as reading the newspaper or watching television: More than half the days  Moving or speaking so slowly that other people could have noticed. Or the opposite - being so fidgety or restless that you have been moving around a lot more than usual: Not at all  Thoughts that you would be better off dead, or of hurting yourself in some way: Not at all  PHQ-9 Total Score: 17  GAD-7 (Oncology Visit Questionnaire) 02/07/2015 06/16/2015 08/23/2021   Feeling nervous, anxious or on edge: 2 3 3    Not being able to stop or control worrying: 1 2 3    Worrying too much about different things: 2 3 3    Trouble relaxing: 1 2 3    Being so restless that it is hard to sit still: 0 2 3   Becoming easily annoyed or irritable: 1 3 2    Feeling afraid as if something awful might happen: 0 1 1   GAD-7 Total Score 7 16 18    If you checked off any problems, how difficult have these problems made it for you to do your work, take care of things at home, or get along with other people? Somewhat difficult Somewhat difficult -       Trace curry 513 [id number] is who she has seen for weight loss    Past Medical History:   Diagnosis Date   ??? Anxiety    ??? Wears glasses     readers     Past Surgical History:   Procedure Laterality Date   ??? AUGMENTATION BREAST ENDOSCOPIC      ??? CYSTOSCOPY N/A 11/08/2020    Procedure: CYSTOSCOPY;  Surgeon: Lamona Curl, MD;  Location: [facility] OR;  Service: Gynecology;  Laterality: N/A;   ??? ENDOMETRIAL ABLATION     ??? gastric sleeve bariatric surgeyr by dr Clementeen Graham     ??? REDUCTION BREAST  01/18/2021   ??? SUSPENSION BLADDER NECK TRANS-VAGINAL SLING N/A 11/08/2020    Procedure: PERINEORRHAPHY, TRANS-VAGINAL TAPE;;  Surgeon: Lamona Curl, MD;  Location: [facility] OR;  Service: Gynecology;  Laterality: N/A;   ??? tummy tuck  04/16/2021   ??? VULVA SURGERY N/A 11/08/2020    Procedure: LABIAPLASTY;  Surgeon: Lamona Curl, MD;  Location: [facility] OR;  Service: Gynecology;  Laterality: N/A;     Social History     Socioeconomic History   ??? Marital status: Married     Spouse name: Not on file   ??? Number of children: Not on file   ??? Years of education: Not on file   ??? Highest education level: Not on file   Occupational History   ??? Not on file   Tobacco Use   ???  Smoking status: Former Smoker     Quit date: 11/07/1992     Years since quitting: 28.8   ??? Smokeless tobacco: Never Used   Vaping Use   ??? Vaping Use: Never used   Substance and Sexual Activity   ??? Alcohol use: No   ??? Drug use: No   ??? Sexual activity: Yes     Partners: Male   Other Topics Concern   ??? Caffeine Use Yes   ??? Occupational Exposure Not Asked   ??? Exercise Yes     Comment: 2 -3 times a week riding bikes and walking   ??? Seat Belt Yes   Social History Narrative   ??? Not on file     Social Determinants of Health     Financial Resource Strain: Not on file   Physical Activity: Not on file   Stress: Not on file   Social Connections: Not on file   Housing Stability: Not on file     Family History   Problem Relation Age of Onset   ??? Heart disease Mother         mi at age high 39   ??? Migraines Sister    ??? Diabetes Paternal Grandmother    ??? Heart disease Paternal Grandmother    ??? Miscarriages / Stillbirths Paternal Grandmother      Physical Exam  Constitutional:       Appearance: Normal appearance.   Cardiovascular:      Rate and Rhythm:  Normal rate and regular rhythm.      Pulses: Normal pulses.      Heart sounds: Normal heart sounds.   Neurological:      Mental Status: She is alert.   Psychiatric:         Mood and Affect: Mood normal.         Behavior: Behavior normal.       Lab Results   Component Value Date    WBC 4.8 07/22/2021    HGB 10.3 (L) 07/22/2021    HCT 31.4 (L) 07/22/2021    MCV 83.8 07/22/2021    PLT 291 07/22/2021     Lab Results   Component Value Date    HGBA1C 5.1 12/07/2015     Lab Results   Component Value Date    CHOLTOT 162 12/07/2015    TRIG 63 12/07/2015    HDL 57 (L) 12/07/2015    LDL 92 12/07/2015     Lab Results   Component Value Date    TSH 1.99 12/07/2015     Lab Results   Component Value Date    VITD25H 41.8 01/02/2021     Lab Results   Component Value Date    GLUCOSE 191 (H) 07/22/2021    BUN 6 (L) 07/22/2021    CO2 26 07/22/2021    CREATININE 0.67 07/22/2021    K 3.3 (L) 07/22/2021    NA 136 07/22/2021    CL 105 07/22/2021    CALCIUM 8.8 07/22/2021    ALBUMIN 4.0 01/02/2021    PROT 6.3 (L) 01/02/2021    ALKPHOS 85 01/02/2021    ALT 9 01/02/2021    AST 21 01/02/2021    BILITOT 0.3 01/02/2021     A/p  Depression and anxiety   Hot flashes   trial effexor  H/o alcohol dependence    Now sober  Low potassium check cmp

## 2021-08-30 MED ORDER — buPROPion XL (WELLBUTRIN XL) 150 MG 24 hr tablet
150 | ORAL_TABLET | Freq: Every day | ORAL | 0 refills | Status: AC
Start: 2021-08-30 — End: 2021-11-08

## 2021-09-13 MED ORDER — naltrexone (DEPADE) 50 mg tablet
50 | ORAL_TABLET | Freq: Every day | ORAL | 0 refills | Status: AC
Start: 2021-09-13 — End: 2021-11-24

## 2021-10-02 ENCOUNTER — Inpatient Hospital Stay
Admit: 2021-10-02 | Discharge: 2021-10-02 | Disposition: A | Discharge status: Home / Self Care | Payer: PRIVATE HEALTH INSURANCE

## 2021-10-02 NOTE — Unmapped (Signed)
Pt reports rt shoulder injury from a fall, now has decrease ROM and continued pain

## 2021-10-03 ENCOUNTER — Emergency Department: Admit: 2021-10-03 | Payer: PRIVATE HEALTH INSURANCE

## 2021-10-03 ENCOUNTER — Inpatient Hospital Stay
Admit: 2021-10-03 | Discharge: 2021-10-03 | Disposition: A | Discharge status: Home / Self Care | Payer: PRIVATE HEALTH INSURANCE

## 2021-10-03 DIAGNOSIS — S42011A Anterior displaced fracture of sternal end of right clavicle, initial encounter for closed fracture: Secondary | ICD-10-CM

## 2021-10-03 MED ORDER — HYDROcodone-acetaminophen (NORCO) 5-325 mg per tablet 1 tablet
5-325 | Freq: Once | ORAL | Status: AC
Start: 2021-10-03 — End: 2021-10-03
  Administered 2021-10-03: 14:00:00 1 via ORAL

## 2021-10-03 MED ORDER — HYDROcodone-acetaminophen (NORCO) 5-325 mg per tablet
5-325 | ORAL_TABLET | Freq: Four times a day (QID) | ORAL | 0 refills | 15.50000 days | Status: AC | PRN
Start: 2021-10-03 — End: 2021-10-08

## 2021-10-03 MED ORDER — ketorolac (TORADOL) injection 15 mg
15 | Freq: Once | INTRAMUSCULAR | Status: AC
Start: 2021-10-03 — End: 2021-10-03
  Administered 2021-10-03: 12:00:00 15 mg via INTRAMUSCULAR

## 2021-10-03 MED FILL — KETOROLAC 15 MG/ML INJECTION SOLUTION: 15 15 mg/mL | INTRAMUSCULAR | Qty: 1

## 2021-10-03 MED FILL — HYDROCODONE 5 MG-ACETAMINOPHEN 325 MG TABLET: 5-325 5-325 mg | ORAL | Qty: 1

## 2021-10-03 NOTE — Unmapped (Signed)
Pt states, I fell getting out of the hot tub Thursday and I think I broke my right collar bone.

## 2021-10-03 NOTE — Unmapped (Signed)
Sling was applied, pt is to follow-up with ortho in 2 days. Will give pain meds before pt will leave.  Pt verbalizes understanding of discharge instructions and has been given the opportunity to ask questions.  Due to infection precautions, patient is unable to sign at this time.

## 2021-10-03 NOTE — Unmapped (Signed)
[place] ED Note    Date of service:  10/03/2021    Reason for Visit: Fall      Patient History     HPI Sherry Compton is a 55 year old female who presented to the emergency department for evaluation of right shoulder pain the patient had a mechanical fall from her hot tub on Thursday and presents for evaluation of right shoulder pain.  The patient was having pain discomfort the range of motion of her right shoulder and reproducible pain over her right clavicle.  She reports no other areas of injury and denies any head injury.    Past Medical History:   Diagnosis Date   ??? Anxiety    ??? Wears glasses     readers       Past Surgical History:   Procedure Laterality Date   ??? AUGMENTATION BREAST ENDOSCOPIC     ??? CYSTOSCOPY N/A 11/08/2020    Procedure: CYSTOSCOPY;  Surgeon: Lamona Curl, MD;  Location: [facility] OR;  Service: Gynecology;  Laterality: N/A;   ??? ENDOMETRIAL ABLATION     ??? gastric sleeve bariatric surgeyr by dr Clementeen Graham     ??? REDUCTION BREAST  01/18/2021   ??? SUSPENSION BLADDER NECK TRANS-VAGINAL SLING N/A 11/08/2020    Procedure: PERINEORRHAPHY, TRANS-VAGINAL TAPE;;  Surgeon: Lamona Curl, MD;  Location: [facility] OR;  Service: Gynecology;  Laterality: N/A;   ??? tummy tuck  04/16/2021   ??? VULVA SURGERY N/A 11/08/2020    Procedure: LABIAPLASTY;  Surgeon: Lamona Curl, MD;  Location[facility] OR;  Service: Gynecology;  Laterality: N/A;       Social History     Tobacco Use   Smoking Status Former Smoker   ??? Quit date: 11/07/1992   ??? Years since quitting: 28.9   Smokeless Tobacco Never Used       Social History     Substance and Sexual Activity   Alcohol Use No       Social History     Substance and Sexual Activity   Drug Use No       Previous Medications    BUPROPION XL (WELLBUTRIN XL) 150 MG 24 HR TABLET    Take 1 tablet (150 mg total) by mouth daily.    NALTREXONE (DEPADE) 50 MG TABLET    Take 1 tablet (50 mg total) by mouth daily.    SULFAMETHOXAZOLE-TRIMETHOPRIM (BACTRIM DS) 800-160 MG  PER TABLET    One tablet after sex to prevent uti    VALACYCLOVIR (VALTREX) 1000 MG TABLET    TAKE 2 TABLETS(2000 MG) BY MOUTH EVERY 12 HOURS FOR 1 DAY FOR COLD SORE       Allergies:   Allergies as of 10/03/2021   ??? (No Known Allergies)     Family history has been reviewed electronic medical record  Review of Systems     Review of Systems  Constitutional:  Denies fever or chills   Eyes:  Denies change in visual acuity   HENT:  Denies nasal congestion or sore throat   Respiratory:  Denies cough or shortness of breath   Cardiovascular:  Denies chest pain or edema   GI:  Denies abdominal pain, nausea, vomiting, bloody stools or diarrhea   GU:  Denies dysuria   Musculoskeletal:See HPI  Integument:  Denies rash   Neurologic:  Denies headache, focal weakness or sensory changes   Endocrine:  Denies polyuria or polydipsia   Lymphatic:  Denies swollen glands   Psychiatric:  Denies depression or anxiety  Physical Exam     ED Triage Vitals [10/03/21 0751]   Vital Signs Group      Temp 98 ??F (36.7 ??C)      Temp Source Oral      Heart Rate 94      Heart Rate Source Monitor      Resp 16      SpO2 100 %      BP (!) 123/100      MAP (mmHg) 105      BP Location Right arm      BP Method Automatic      Patient Position Lying   SpO2 100 %   O2 Device None (Room air)       Physical Exam  Vitals and nursing note reviewed.   Constitutional:       Appearance: Normal appearance. She is not ill-appearing or diaphoretic.   HENT:      Head: Normocephalic and atraumatic.   Musculoskeletal:         General: Tenderness and signs of injury present. No swelling or deformity. Normal range of motion.      Right lower leg: No edema.      Left lower leg: No edema.      Comments: The patient has reproducible tenderness on examination of her right shoulder.  The patient has limited range of motion secondary to plain with reproducible tenderness over the anterior clavicle the patient is otherwise neuro, motor and vascular intact of the right shoulder.    Skin:     General: Skin is warm and dry.      Coloration: Skin is not pale.      Findings: No erythema.   Neurological:      Mental Status: She is alert.           Diagnostic Studies     Labs:    Please see electronic medical record for any tests performed in the ED    Radiology:    Please see electronic medical record for any tests performed in the ED    EKG:    No EKG Performed    Emergency Department Procedures     Procedures none    ED Course and MDM     Sherry Compton is a 55 y.o. female who presented to the emergency department with Fall  The patient presented to the emergency department for evaluation of right clavicle and shoulder injury.  She had a mechanical fall on Thursday out of her hot tub was having increasing pain discomfort over her right shoulder and clavicle and presents for evaluation of right upper extremity injury.  The patient has limited range of motion secondary to pain she otherwise is is neuro, motor vascular intact on exam the patient has reproducible tenderness over the anterior clavicle.  She will have x-rays obtained of right clavicle and right shoulder for evaluation of injuries of right upper extremity.  X-rays demonstrate a right clavicle fracture.  The patient be placed in arm sling and recommended outpatient follow-up with orthopedic surgery for follow-up of right clavicle fracture           Critical Care Time (Attendings)   None  Final impression is right clavicle fracture  Disposition is discharged home       Cherly Anderson, MD  10/03/21 [phone]

## 2021-10-06 ENCOUNTER — Ambulatory Visit: Admit: 2021-10-06 | Discharge: 2021-10-06 | Payer: PRIVATE HEALTH INSURANCE | Attending: Sports Medicine

## 2021-10-06 DIAGNOSIS — S42021A Displaced fracture of shaft of right clavicle, initial encounter for closed fracture: Secondary | ICD-10-CM

## 2021-10-06 MED ORDER — HYDROcodone-acetaminophen (NORCO) 5-325 mg per tablet
5-325 | ORAL_TABLET | Freq: Three times a day (TID) | ORAL | 0 refills | 15.50000 days | Status: AC | PRN
Start: 2021-10-06 — End: 2021-10-13

## 2021-10-06 MED ORDER — naloxone (NARCAN) 4 mg/actuation Spry
4 | NASAL | 1 refills | Status: AC | PRN
Start: 2021-10-06 — End: ?

## 2021-10-06 NOTE — Unmapped (Signed)
This office note has been dictated.

## 2021-10-06 NOTE — Unmapped (Signed)
[facility]    PATIENT NAME:      Sherry Compton, Sherry Compton                MRN:                 [id number]  DATE OF BIRTH:     [birth year]                    CSN:                 [id number]  PROVIDER:          Gayla Medicus, MD           VISIT DATE:          10/06/2021                                   OFFICE NOTE      LOCATION:  [facility].    Sherry Compton comes in for right shoulder.  On October 6, she fell over hot tub and injured her shoulder.  She waited a while before coming to the ER on 10/11.  She was diagnosed with clavicle fracture.  She has been in a sling.  She comes in for further   evaluation and treatment.    PHYSICAL EXAMINATION:  The patient's right shoulder is examined.  She does have obvious deformity at the medial 3rd of the clavicle where her fracture is located.  I can rotate her shoulder with internal and external rotation without pain.  She does have pain if I try to   elevate it.  She has full range of motion of her elbow, wrist, and hand.  She is neurovascularly intact distally.    IMAGING:  The patient had multiple views of her shoulder.  These showed some minimal arthritic change within the shoulder joint.  She does have a medial 3rd transverse fracture of her clavicle.  This is slightly displaced, does not appear to be any   significant shortening.    ASSESSMENT:  Right clavicle fracture.    PLAN:  I went over the imaging results with the patient and her significant other.  We talked about treatment options.  I have recommended an attempt at conservative treatment.  She will stay in her sling.  We will get her started on physical therapy   about 2 weeks from her injury.  This will be for passive range of motion.  I will see her back in 4 weeks with new x-rays to see how she is progressing.  I did give her a small prescription for Norco to try and help her in the next week.  I did tell her   I would want her  to transition to ibuprofen and Tylenol.  Everything was explained in detail with the patient and her significant other and they voiced understanding and agreement with the plan.        Gayla Medicus, MD      CU/AQ  DD:?? 10/06/2021 09:21:04  DT:?? 10/06/2021 09:54:18    JOB#:  [id number]

## 2021-10-24 ENCOUNTER — Ambulatory Visit: Admit: 2021-10-24 | Payer: PRIVATE HEALTH INSURANCE | Attending: Addiction (Substance Use Disorder)

## 2021-10-24 DIAGNOSIS — F1020 Alcohol dependence, uncomplicated: Secondary | ICD-10-CM
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
## 2021-10-24 NOTE — Unmapped (Signed)
IOP PLAN OF CARE      Sherry Compton  [id number]    [facility] IOP PLAN OF CARE:   Admission Date:  10/24/2021  For RECOVERY SERVICES: [place] [facility] Level Of Care:  I B  Date Completed ([place] plan):  01/24/2022  Proposed Length of Treatment:  12 weeks  Frequency of Sessions:  3 Times a Week  Plan Name:  IOP Adult  Internal Services Provided:  Mental Health/CD Assessment (Non-Physician)  Problem #1: Alcohol, Cannabis, Cocaine use disorders    Goal #1:I don't want to drink or use again.  Patient will abstain from all drugs of reward while in treatment.   Date Identified:  01/24/2022   Interventions:Group Psychotherapy  Individual Psychotherapy     Problem #2:Depression    Goal# 2:I don't think I'm depressed it was more drinking.  Patient will explore the relationship between drinking and her depression and learn abstinence based coping skills.    Date Identified:  01/24/2022   Interventions:Group Psychotherapy  Individual Psychotherapy    Challenges As Witnessed By: Using secondary Drugs of choice.   This plan has been discussed with the patient or guardian: Yes  Patient Participated in Treatment Plan?:  Yes        RN Signature:   Date/Time:    Psychiatrist Signature:  Date/Time:    Higher education careers adviser:  Date/Time:    Patient Signature:  Date/Time:        Date:      Treatment Plan to be Updated:

## 2021-10-25 ENCOUNTER — Encounter: Admit: 2021-10-25 | Payer: PRIVATE HEALTH INSURANCE | Attending: Addiction (Substance Use Disorder)

## 2021-10-25 DIAGNOSIS — F1020 Alcohol dependence, uncomplicated: Secondary | ICD-10-CM

## 2021-10-25 NOTE — Unmapped (Signed)
IOP Group Note    Date: 10/25/2021    Start Time: 6:00 PM    End Time: 9:00 PM    Duration (min): 180 Minutes    Clinician: Petra Kuba, LISW-S    Program: IOP Outpatient Adult    Staff to Patient Ratio:  1:10    This was a video visit, including two-way audio and video communication, in lieu of an in-person visit. The patient provided verbal consent to use the video visit.    I spent 180M on this call conducting an interview, performing a limited exam by video, with >50% spent educating the patient on my assessment and plan.    Group Notes:Alcohol and Tobacco:    This session begins with a brief quiz about your alcohol use.:    The session then explores the effects of alcohol, the  progress of alcoholism, and several alcohol-related  health problems.    The session reviews certain processes related to  nicotine use and the negative health-related effects  of nicotine.    Individual Note:Emotional/attitudinal check in where each patient shared their drug of choice, last use, recovery involvement, triggers, cravings and therapist/medication.   Sherry Compton reports tonight is her first night in group.  She was somewhat impaired from alcohol.  She was drinking wine.  The group wasn't triggered so I permitted her to stay and listen.  I muted her and turned her camera off.  Prior to this she shared what brought her to treatment.      Hour 1: check in.    Hour 2: Alcohol and Tobacco packet.    Hour 3: Alcohol and Tobacco packet.

## 2021-10-26 ENCOUNTER — Encounter: Admit: 2021-10-27 | Payer: PRIVATE HEALTH INSURANCE | Attending: Addiction (Substance Use Disorder)

## 2021-10-26 DIAGNOSIS — F1020 Alcohol dependence, uncomplicated: Secondary | ICD-10-CM

## 2021-10-26 NOTE — Unmapped (Signed)
IOP Group Note    Date: 10/26/2021    Start Time: 6:00 PM    End Time: 9:00 PM    Duration (min): 180 Minutes    Clinician: Petra Kuba, LISW-S    Program: IOP Outpatient Adult    Staff to Patient Ratio:  1:8    This was a video visit, including two-way audio and video communication, in lieu of an in-person visit. The patient provided verbal consent to use the video visit.    I spent 180M on this call conducting an interview, performing a limited exam by video, with >50% spent educating the patient on my assessment and plan.    Group Notes:Common Problems in early Recovery Lecture/Presentation.  This presentation focused on four problem areas people in early recovery often have.  The problems each had subsets of problems also.  The four domains are as follows:  1.) Problems with the Disease Model of Addiction, 2.) Problems with 12-Step Groups, 3.) Problems with Self-Diagnosis and 4.) Problems with the Relapse Process.      Individual Note:Emotional/attitudinal check in where each patient shared their drug of choice, last use, recovery involvement, triggers, cravings and therapist/medication.   Kelvin reports that for her the relapse process was something she struggled with and shared about her relapse after 25 years of sobriety.    Hour 1: check in.     Hour 2: Common Problems in early Recovery lecture/presentation.    Hour 3: Common Problems in early Recovery lecture/presentation.

## 2021-10-30 ENCOUNTER — Encounter: Admit: 2021-10-31 | Payer: PRIVATE HEALTH INSURANCE

## 2021-10-30 DIAGNOSIS — F1020 Alcohol dependence, uncomplicated: Secondary | ICD-10-CM

## 2021-10-30 NOTE — Unmapped (Signed)
IOP Group Note    Date: 10/30/2021    Start Time: 6:00 PM    End Time: 9:00 PM    Duration (min): 180 Minutes    Clinician: Marquett Bertoli, LISW    Program: IOP Outpatient Adult    Staff to Patient Ratio:  1:10    This was a video visit, including two-way audio and video communication, in lieu of an in-person visit. The patient provided verbal consent to use the video visit.    I spent 180 mins on this call conducting an interview, performing a limited exam by video, with >50% spent educating the patient on my assessment and plan.      Group Notes:Spirituality:    This session focuses on a few issues related to spirituality,  prayer, and a Higher Power. It will help you examine your  personal views about spirituality, the effects of addiction on  spirituality, and some ideas about what spirituality means. The  session also explores what the terms Higher Power, spiritual  experience, and prayer mean.      Individual Note:Patient discussed stressors, triggers, high risk situations, drug of choice, last use, recovery involvement and mood. Patient's drug of choice is alcohol and her last use was on Thursday. She reported that day 3 and 4 of sobriety is typically pretty tough for her and she has a lot of triggers.     Hour 1: Check in     Hour 2:  Spirituality    Hour 3:  Spirituality

## 2021-11-01 ENCOUNTER — Encounter: Admit: 2021-11-02 | Payer: PRIVATE HEALTH INSURANCE | Attending: Addiction (Substance Use Disorder)

## 2021-11-01 DIAGNOSIS — F1020 Alcohol dependence, uncomplicated: Secondary | ICD-10-CM

## 2021-11-01 NOTE — Unmapped (Signed)
IOP Group Note    Date: 11/01/2021    Start Time: 6:00 PM    End Time: 9:00 PM    Duration (min): 180 Minutes    Clinician: Petra Kuba, LISW-S    Program: IOP Outpatient Adult    Staff to Patient Ratio:  1:9    This was a video visit, including two-way audio and video communication, in lieu of an in-person visit. The patient provided verbal consent to use the video visit.    I spent 180M on this call conducting an interview, performing a limited exam by video, with >50% spent educating the patient on my assessment and plan.    Group Notes:Sex, Drugs and Alcohol:     This session focuses on the relationships between substance use and sex, the effects of substances on sex, and the role that sex can have as a trigger for substance use.  In addition, this session explores a variety of sexual problems and tips related to sex and recovery.    Individual Note:Emotional/attitudinal check in where each patient shared their drug of choice, last use, recovery involvement, triggers, cravings and therapist/medication.   Sherry Compton participated well tonight.  She processed during the check in.  She participated in a discussion.      Hour 1: check in.     Hour 2: Packet and processing.       Hour 3: Packet and processing.

## 2021-11-06 ENCOUNTER — Encounter: Admit: 2021-11-07 | Payer: PRIVATE HEALTH INSURANCE | Attending: Addiction (Substance Use Disorder)

## 2021-11-06 DIAGNOSIS — F1020 Alcohol dependence, uncomplicated: Secondary | ICD-10-CM

## 2021-11-06 NOTE — Unmapped (Signed)
IOP Group Note    Date: 11/06/2021    Start Time: 6:00 PM    End Time: 9:00 PM    Duration (min): 180 Minutes    Clinician: Petra Kuba, LISW-S    Program: IOP Outpatient Adult    Staff to Patient Ratio:  1:8    This was a video visit, including two-way audio and video communication, in lieu of an in-person visit. The patient provided verbal consent to use the video visit.    I spent 180M on this call conducting an interview, performing a limited exam by video, with >50% spent educating the patient on my assessment and plan.    Group Notes:Stress and Emotional Well Being:    This session provides important information about how you can change your life by handling stress. It focuses on stress, the different types of stress, and the  ways in which you respond to stress.    Individual Note:Emotional/attitudinal check in where each patient shared their drug of choice, last use, recovery involvement, triggers, cravings and therapist/medication.   Sherry Compton reports  She relapsed over the weekend.  She processed this with the group.  Prior to group we discussed if a virtual IOP was the best fit for her due to constant tech issues and not being able to retreive the email with the link (that I always send the next after group for the next group).  She often logs on at the last minute and this compounds her tech difficulties (tonight she logged in early). Ii asked if she was impaired at groups due to certain groups she seems off cognitively.  She can't remember details of the group or what topic we are on.  I've thought she was impaired.  She is still using her secondary drug of choice (cannabis).  I recommended her consider a higher level of care (residential).  It may take this separation from AOD for her to achieve stability.  She has a good prognosis once she gets started.     Hour 1: check in.     Hour 2:  Packet up to part 2.       Hour 3:  Packet up to part 2.

## 2021-11-08 ENCOUNTER — Inpatient Hospital Stay
Admit: 2021-11-08 | Discharge: 2021-11-08 | Disposition: A | Discharge status: Home / Self Care | Payer: PRIVATE HEALTH INSURANCE

## 2021-11-08 ENCOUNTER — Encounter: Admit: 2021-11-09 | Payer: PRIVATE HEALTH INSURANCE

## 2021-11-08 DIAGNOSIS — R45851 Suicidal ideations: Secondary | ICD-10-CM

## 2021-11-08 DIAGNOSIS — F1020 Alcohol dependence, uncomplicated: Secondary | ICD-10-CM

## 2021-11-08 LAB — BASIC METABOLIC PANEL
Anion Gap: 10 mmol/L (ref 3–16)
BUN: 5 mg/dL (ref 7–25)
CO2: 26 mmol/L (ref 21–33)
Calcium: 9 mg/dL (ref 8.6–10.3)
Chloride: 108 mmol/L (ref 98–110)
Creatinine: 0.61 mg/dL (ref 0.60–1.30)
EGFR: >90
Glucose: 91 mg/dL (ref 70–100)
Osmolality, Calculated: 295 mOsm/kg (ref 278–305)
Potassium: 3.7 mmol/L (ref 3.5–5.3)
Sodium: 144 mmol/L (ref 133–146)

## 2021-11-08 LAB — DIFFERENTIAL
Basophils Absolute: 57 /uL (ref 0–200)
Basophils Relative: 1.5 % (ref 0.0–1.0)
Eosinophils Absolute: 137 /uL (ref 15–500)
Eosinophils Relative: 3.6 % (ref 0.0–8.0)
Lymphocytes Absolute: 1429 /uL (ref 850–3900)
Lymphocytes Relative: 37.6 % (ref 15.0–45.0)
Monocytes Absolute: 289 /uL (ref 200–950)
Monocytes Relative: 7.6 % (ref 0.0–12.0)
Neutrophils Absolute: 1889 /uL (ref 1500–7800)
Neutrophils Relative: 49.7 % (ref 40.0–80.0)
nRBC: 0 /100 WBC (ref 0–0)

## 2021-11-08 LAB — URINE DRUG SCREEN WITHOUT CONFIRMATION, STAT
Amphetamine, 500 ng/mL Cutoff: NEGATIVE
Barbiturates UR, 300  ng/mL Cutoff: NEGATIVE
Benzodiazepines UR, 300 ng/mL Cutoff: NEGATIVE
Buprenorphine, 5 ng/mL Cutoff: NEGATIVE
Cocaine UR, 300 ng/mL Cutoff: NEGATIVE
Fentanyl, 2 ng/mL Cutoff: NEGATIVE
Methadone, UR, 300 ng/mL Cutoff: NEGATIVE
Opiates UR, 300 ng/mL Cutoff: POSITIVE
Oxycodone, 100 ng/mL Cutoff: NEGATIVE
THC UR, 50 ng/mL Cutoff: POSITIVE
Tricyclic Antidepressants, 300 ng/mL Cutoff: POSITIVE

## 2021-11-08 LAB — CBC
Hematocrit: 35.1 % (ref 35.0–45.0)
Hemoglobin: 11.3 g/dL (ref 11.7–15.5)
MCH: 29.3 pg (ref 27.0–33.0)
MCHC: 32.3 g/dL (ref 32.0–36.0)
MCV: 90.4 fL (ref 80.0–100.0)
MPV: 6.9 fL (ref 7.5–11.5)
Platelets: 250 10*3/uL (ref 140–400)
RBC: 3.88 10*6/uL (ref 3.80–5.10)
RDW: 23.4 % (ref 11.0–15.0)
WBC: 3.8 10*3/uL (ref 3.8–10.8)

## 2021-11-08 LAB — ACETAMINOPHEN LEVEL
Acetaminophen Level: 17 ug/mL (ref 10–30)
Acetaminophen Level: <10 ug/mL (ref 10–30)

## 2021-11-08 LAB — HEPATIC FUNCTION PANEL
ALT: 12 U/L (ref 7–52)
AST: 16 U/L (ref 13–39)
Albumin: 4 g/dL (ref 3.5–5.7)
Alkaline Phosphatase: 66 U/L (ref 36–125)
Bilirubin, Direct: 0.1 mg/dL (ref 0.0–0.4)
Bilirubin, Indirect: 0.1 mg/dL (ref 0.0–1.1)
Total Bilirubin: 0.2 mg/dL (ref 0.0–1.5)
Total Protein: 7.1 g/dL (ref 6.4–8.9)

## 2021-11-08 LAB — URINALYSIS W/RFL TO MICROSCOPIC
Bilirubin, UA: NEGATIVE
Blood, UA: NEGATIVE
Glucose, UA: NEGATIVE mg/dL
Ketones, UA: NEGATIVE mg/dL
Leukocytes, UA: NEGATIVE
Nitrite, UA: NEGATIVE
Protein, UA: NEGATIVE mg/dL
Specific Gravity, UA: 1.015 (ref 1.005–1.035)
Urobilinogen, UA: 0.2 EU/dL (ref 0.2–1.0)
pH, UA: 6 (ref 5.0–8.0)

## 2021-11-08 LAB — HCG URINE, QUALITATIVE: hCG Qualitative -Clinitek: NEGATIVE

## 2021-11-08 LAB — SALICYLATE LEVEL: Salicylate Lvl: <3 mg/dL (ref 10–30)

## 2021-11-08 LAB — ETHANOL, SERUM: Ethanol: 219 mg/dL (ref 0–10)

## 2021-11-08 MED ORDER — venlafaxine (EFFEXOR-XR) 37.5 MG 24 hr capsule
37.5 | ORAL_CAPSULE | Freq: Every day | ORAL | 1 refills | Status: AC
Start: 2021-11-08 — End: 2021-11-24

## 2021-11-08 NOTE — Unmapped (Signed)
Patient reporting she has been drinking, has been here before for accidental overdose before where she accidentally overdosed on alcohol and cocaine, however patient reporting she has not done cocaine today.

## 2021-11-08 NOTE — Unmapped (Addendum)
[place] ED  Reassessment Note    Sherry Compton is a 55 y.o. female who presented to the emergency department on 11/08/2021. This patient was initially seen by an off-going provider and their care has been turned over to me. Please see the original provider's note for details regarding the initial history, physical exam and ED course.  At the time of turnover the following steps in the patient's evaluation were pending: Reassessment and evaluation by social work.  Patient resting comfortably, without hypoxia.  Normal respiratory rate.  Patient cooperative.  Patient will be seen by psychiatrist here at [facility], Dr Moise Boring.        Clinical Impression:    1.  Suicidal ideation  2.  Polypharmacy ingestion

## 2021-11-08 NOTE — Unmapped (Addendum)
PSYCHIATRY CONSULT, INITIAL EVALUATION    Sherry Compton C24/C24W     Date/time of admission: 11/08/2021 12:22 AM    CC/Reason for Consult: safety assessment     ASSESSMENT AND PLAN: Sherry Compton is a 55 y.o. White or Caucasian female with a PMH of EtOH abuse presented to the ED on 11/16 after concern for overdose in setting of EtOH intoxication.  Psychiatry was asked to evaluate for intentional overdose.      Ethanol level on arrival was 219  Acetaminophen level was 17 (wnl)  Salicylate was < 3  UDS + TCAs, opiates, THC     Narrative/Formulation  Sherry Compton is feeling better today.  Does not recall details of all the events of last night, but adamantly denies SI.  Feels happy to be alive.  Talks about future plans. Identifies several support people.  Is involved in IOP for ETOH abuse and individual counseling.      Recently started on Wellbutrin, maybe had some improvement, but is agreeable to try Effexor, as I informed her this may help both with mood and vasomotor symptoms associated with menopause - this has been a significant source of stress for her.  Discussed some common side effects.     No acute safety concerns per collateral from her husband.     Dx:  EtOH abuse  Depression, unspecified     Treatment Recommendations  -no indication for involuntary psych commitment - cleared for discharge home  -Will begin Effexor ER 37.5mg  daily for depression - sent electronic Rx  - she is scheduled to see PCP next week  -continue IOP program 3 x week for EtOH abuse  -continue weekly psychotherapy     Risk Assessment:   Risk factors for violence to self/others:age < 25 or >50, mood disorder and ongoing substance use  Protective factors include:female, no previous suicide attempts, marriage, religiosity, life satisfaction, positive social support, denies hopelessness, no history of trauma, no psychosis, no cognitive dysfunction, denies SI/HI, future orientation, goal orientation, contracting for safety, collateral  information is reassuring, close outpatient follow-up, compliant with recommended medications and no access to guns or weapons    Context: argument with husband  Severity: mild  Location: mood disturbance  Associated symptoms: irritability   Modifiers: EtOH abuse  Duration: chronic (> 6 weeks)    Thank you for this consult, please call us with any further questions. We will not continue to follow at this time.    [name]    ________________________________________________________________________    HPI: Sherry Compton is resting comfortably in bed today.  Says she and her husband have been arguing more frequently over little things now that the kids are out of the house.  Last night she was drinking and ended up getting into argument with husband over her phone.  Had drank over a bottle of wine.  She does not remember ingesting any pills or stating that she wanted to die.  She denies feeling suicidal today.      Does admit to a few stressors, referring to maybe a mid-life crisis but denies any major depressive symptoms.  Feels that going through menopause has not helped with mood, and often finds herself irritable and on edge.     She sees a Librarian, academic once a week and finds that to be very helpful.  She is also enrolled in an online IOP for a total of thirty sessions, attends three times a week.  This is for EtOH use.  She has found  it helpful and is a few weeks into the program.      Was recently started on Wellbutrin about a month ago.  Has noticed some improvement.  Ran out and doesn't see provider until next week.     Is on Naltrexone for EtOH use per chart review.     Psychiatry Review of Symptoms:  Depression: None  Anxiety: anxiety  Mania: None  Psychosis: None  Current auditory/visual hallucinations: Denies  Trauma: None    Past Psychiatric History:   Prior diagnoses: depression  Outpatient Treatment: see HPI  Hospitalizations: None  Suicide Attempts/Self harm: None  Aggression/Violence  Towards Others: None  Medication Trials: recently started on Wellbutrin within past month    Medical ROS:   Const:denies fevers/chills/WL   Skin:denies rash/itching.   HENT:denies hearing loss tinnitus and/or ear pain.   Eyes: denies blurred vision.   ZO:[id number] chest pain, palpitations.   Resp:denies cough, hemoptysis, and/or sob.   GI: denies nausea, vomiting, diarrhea, constipation, abd pain  GU: denies dysuria, urgency, frequency, hematuria.   Musc:denies myalgias, neck pain.   Neuro:denies dizziness, focal weakness, numbness/tingling, LOC    Past Medical History:   Past Medical History:   Diagnosis Date   ??? Anxiety    ??? Wears glasses     readers     Past Surgical History:   Procedure Laterality Date   ??? AUGMENTATION BREAST ENDOSCOPIC     ??? CYSTOSCOPY N/A 11/08/2020    Procedure: CYSTOSCOPY;  Surgeon: Lamona Curl, MD;  Location: [facility] OR;  Service: Gynecology;  Laterality: N/A;   ??? ENDOMETRIAL ABLATION     ??? gastric sleeve bariatric surgeyr by dr Clementeen Graham     ??? REDUCTION BREAST  01/18/2021   ??? SUSPENSION BLADDER NECK TRANS-VAGINAL SLING N/A 11/08/2020    Procedure: PERINEORRHAPHY, TRANS-VAGINAL TAPE;;  Surgeon: Lamona Curl, MD;  Location: [facility] OR;  Service: Gynecology;  Laterality: N/A;   ??? tummy tuck  04/16/2021   ??? VULVA SURGERY N/A 11/08/2020    Procedure: LABIAPLASTY;  Surgeon: Lamona Curl, MD;  Location[facility] OR;  Service: Gynecology;  Laterality: N/A;       Substance Use History:  Tobacco:former - quit smoking about 30 years ago  Alcohol:history of heavy ETOH use - had quit about 25 years ago, but picked back up within the past few years; does feel it has caused problems, denies cravings to drink, denies h/o withdrawal symptoms  Illicit Drug [id number]     Social/Developmental History:   Lives with husband.  Has adult children.      Family History:   Family History   Problem Relation Age of Onset   ??? Heart disease Mother         mi at age high 81   ??? Migraines Sister    ??? Diabetes Paternal Grandmother    ??? Heart  disease Paternal Grandmother    ??? Miscarriages / Stillbirths Paternal Grandmother      Psychiatric diagnoses: anxiety  Completed suicide: None    Allergies:  No Known Allergies    Home Medications:   No current facility-administered medications on file prior to encounter.     Current Outpatient Medications on File Prior to Encounter   Medication Sig Dispense Refill   ??? buPROPion XL (WELLBUTRIN XL) 150 MG 24 hr tablet Take 1 tablet (150 mg total) by mouth daily. 30 tablet 0   ??? naloxone (NARCAN) 4 mg/actuation Spry Apply 1 spray in one nostril if needed. Call 911. May repeat dose in  other nostril if no response in 3 minutes. 2 each 1   ??? naltrexone (DEPADE) 50 mg tablet Take 1 tablet (50 mg total) by mouth daily. 30 tablet 0   ??? sulfamethoxazole-trimethoprim (BACTRIM DS) 800-160 mg per tablet One tablet after sex to prevent uti 30 tablet 0   ??? valACYclovir (VALTREX) 1000 MG tablet TAKE 2 TABLETS(2000 MG) BY MOUTH EVERY 12 HOURS FOR 1 DAY FOR COLD SORE 28 tablet 0       Current Medications ordered:  Scheduled Meds:  Continuous Infusions:  PRN Meds:.    OBJECTIVE:  Vitals:  Vitals:    11/08/21 0630 11/08/21 0700 11/08/21 0800 11/08/21 1004   BP: 114/77 115/78 119/82    BP Location:  Right arm Right arm    Patient Position:  Lying     Pulse: 69 69 70 72   Resp:       Temp:       TempSrc:       SpO2: 98% 98% 99% 100%   Weight:       Height:           Mental Status Exam:   Muscle Strength: not assessed  Gait/Station: Not assessed     Appearance:  appropriately dressed, good hygiene and well groomed   Orientation: Person, Place, Time and Situation  Attitude:  cooperative  Motor Behavior:  no psychomotor abnormalities   Speech:  normal rate and rhythm and clarity.  Naming and repeating is intact.   Mood:  good   Affect:  full range , consistent and appropriate to thought   Thought Processes/Associations:   well organized and goal directed  Thought content: no delusions    Suicidal ideation: none   Homicidal ideation:  none  Perceptions:  no hallucinations or other abnormalities   Cognition/Memory:  short term and long term memory intact and alert.   Attention and concentration: is not impaired  Fund of Knowledge: Above average  Insight/ judgement: fair

## 2021-11-08 NOTE — Unmapped (Addendum)
Pt was cleared by inpatient psych and discharged. Pt left without discharge paperwork.

## 2021-11-08 NOTE — Unmapped (Signed)
Patient by EMS from home, EMS reports patient supposedly took unknown quantity of percocet and was given narcan in squad, patient alert now however patient is very combative and uncooperative. Patient is speaking about other issues in her household then answer triage questions.

## 2021-11-08 NOTE — Unmapped (Signed)
Sitter at bedside. See close observation record.

## 2021-11-08 NOTE — Unmapped (Signed)
Pt is sleeping @ this time

## 2021-11-08 NOTE — Unmapped (Signed)
Psychiatry MD spoke with RN regarding patient status. Psychiatry is clearing the patient for discharge, but will speak again to patient regarding refill or switch of antidepressant.

## 2021-11-08 NOTE — Unmapped (Signed)
IOP Group Note    Date: 11/08/2021    Start Time: 6:00 PM    End Time: 9:00 PM    Duration (min): 180 Minutes    Clinician: Audris Speaker, LISW    Program: IOP Outpatient Adult    This was a video visit, including two-way audio and video communication, in lieu of an in-person visit. The patient provided verbal consent to use the video visit.    I spent 180 mins on this call conducting an interview, performing a limited exam by video, with >50% spent educating the patient on my assessment and plan.      Staff to Patient Ratio:  1:8    Group Notes:Stress and Emotional Well Being:    This session provides important information about how you can change your life by handling stress. It focuses on stress, the different types of stress, and the  ways in which you respond to stress.      Individual Note:Patient discussed stressors, triggers, high risk situations, drug of choice, last use, recovery involvement and   Mood. Patient participated well and shared that she recently got a sponsor.     Hour 1:Check in    Hour 2: Stress and Emotional Well Being    Hour 3: Stress and Emotional Well Being

## 2021-11-08 NOTE — Unmapped (Signed)
Report received.  Patient care initiated at this time

## 2021-11-08 NOTE — Unmapped (Signed)
Husband is out in the waiting room

## 2021-11-08 NOTE — Unmapped (Signed)
[place] ED Note  Date of service:  11/08/2021    Reason for Visit: Drug Overdose    Patient History     HPI:  Sherry Compton is a 55 y.o. female with a PMH of alcohol use disorder who presents to the ED today with a chief complaint of apparent overdose.  History is limited from patient due to apparent intoxication.  She tells me she drink a bottle and a half of wine tonight and I smoke weed all the time.  She denies suicidal and homicidal ideation and reports no hallucinations.  She does not provide any additional history and denies any ingestion.    Per husband who called EMS, patient was reportedly drinking alcohol tonight and then became frustrated.  He states that she then grabbed his bottles of pills which he has for kidney stone pain and ingested all of the tablets in 2 bottles of pills, 1 bottle for Percocet which had 10 tablets of 5-325 hydrocodone-acetaminophen and 1 bottle of Xanax which I think she will place with something else a long time ago, so I have no idea what this was.  He does not know if she was trying to kill herself and states I think she was just trying to piss me off.  She reportedly has a history of doing similar things previously.  Husband reports that after some time, she became increasingly lethargic and then unresponsive for which he called EMS.  On their arrival, they were concern for apnea so administered Narcan with good response and she was transported here.    Past Medical History:   Diagnosis Date   ??? Anxiety    ??? Wears glasses     readers       Past Surgical History:   Procedure Laterality Date   ??? AUGMENTATION BREAST ENDOSCOPIC     ??? CYSTOSCOPY N/A 11/08/2020    Procedure: CYSTOSCOPY;  Surgeon: Lamona Curl, MD;  Location: [facility] OR;  Service: Gynecology;  Laterality: N/A;   ??? ENDOMETRIAL ABLATION     ??? gastric sleeve bariatric surgeyr by dr Clementeen Graham     ??? REDUCTION BREAST  01/18/2021   ??? SUSPENSION BLADDER NECK TRANS-VAGINAL SLING N/A 11/08/2020    Procedure: PERINEORRHAPHY,  TRANS-VAGINAL TAPE;;  Surgeon: Lamona Curl, MD;  Location: [facility] OR;  Service: Gynecology;  Laterality: N/A;   ??? tummy tuck  04/16/2021   ??? VULVA SURGERY N/A 11/08/2020    Procedure: LABIAPLASTY;  Surgeon: Lamona Curl, MD;  Location: [facility] OR;  Service: Gynecology;  Laterality: N/A;       Previous Medications    BUPROPION XL (WELLBUTRIN XL) 150 MG 24 HR TABLET    Take 1 tablet (150 mg total) by mouth daily.    NALOXONE (NARCAN) 4 MG/ACTUATION SPRY    Apply 1 spray in one nostril if needed. Call 911. May repeat dose in other nostril if no response in 3 minutes.    NALTREXONE (DEPADE) 50 MG TABLET    Take 1 tablet (50 mg total) by mouth daily.    SULFAMETHOXAZOLE-TRIMETHOPRIM (BACTRIM DS) 800-160 MG PER TABLET    One tablet after sex to prevent uti    VALACYCLOVIR (VALTREX) 1000 MG TABLET    TAKE 2 TABLETS(2000 MG) BY MOUTH EVERY 12 HOURS FOR 1 DAY FOR COLD SORE     No Known Allergies    Social History     Socioeconomic History   ??? Marital status: Married     Spouse name: Not on file   ???  Number of children: Not on file   ??? Years of education: Not on file   ??? Highest education level: Not on file   Occupational History   ??? Not on file   Tobacco Use   ??? Smoking status: Former     Types: Cigarettes     Quit date: 11/07/1992     Years since quitting: 29.0   ??? Smokeless tobacco: Never   Vaping Use   ??? Vaping Use: Never used   Substance and Sexual Activity   ??? Alcohol use: No   ??? Drug use: No   ??? Sexual activity: Yes     Partners: Male   Other Topics Concern   ??? Caffeine Use Yes   ??? Occupational Exposure Not Asked   ??? Exercise Yes     Comment: 2 -3 times a week riding bikes and walking   ??? Seat Belt Yes   Social History Narrative   ??? Not on file     family history includes Diabetes in her paternal grandmother; Heart disease in her mother and paternal grandmother; Migraines in her sister; Miscarriages / Stillbirths in her paternal grandmother.    All nursing notes and triage notes were appropriately reviewed in the course of the  creation of this note.     Review of Systems     Unable to obtain review of systems due to apparent intoxication.    Physical Exam     Vitals:    11/08/21 0152   BP:    Pulse: 63   Resp: 15   Temp:    SpO2: 99%       Constitutional: Alert but not particularly cooperative.  Smells of alcohol and appears intoxicated.  Eyes:  EOM intact, sclera anicteric, conjunctiva normal.    HENT:  Atraumatic, external ears normal, nose normal, oropharynx moist.   Neck: Normal range of motion.   Respiratory:  Normal respiratory effort, normal breath sounds, good air movement.   Cardiovascular:  Regular rate; no murmurs, gallops, or rubs.  GI:  Soft, non-distended, non-tender. No guarding or rebound.   Musculoskeletal:  No edema, no deformities.   Skin:  No rash or nodules noted.   Neurologic:  Awake and alert.  Oriented to location, time, and person.  Speech is slurred but cranial nerves are otherwise intact.  She follows commands with all extremities.  Psychiatric: Agitated and argumentative with staff.  Denies suicidal and homicidal ideation.     Diagnostic Studies     Labs:  Please see electronic medical record for any tests performed in the ED     Radiology:  Please see electronic medical record for any tests performed in the ED    ED Course and MDM     Sherry Compton is a 55 y.o. female  who presented to the emergency department after an apparent overdose.  The patient is intoxicated appearing on arrival with reassuring initial vital signs.    Her labs reveal normal white count, mild anemia with a hemoglobin of 11.3, normal platelets.  Electrolytes, renal function, glucose reassuring.  LFTs unremarkable.  Salicylate level undetectable.  Initial acetaminophen level is 17 obtained about 1 hour after ingestion.  Ethanol level is 219.  Urinalysis without evidence of infection, urine pregnancy is negative.  Urine drug screen positive for opiates, TCAs, and THC.    I have spoken to poison control who recommended an 8-hour observation  period and obtaining a 4-hour acetaminophen level which is undetectable.  I have signed a  statement of belief given concerns for suicidal ideation and an intentional overdose.  At this time, her care will be passed to the oncoming provider whose responsibilities will include: Follow-up psych recommendations, determine appropriate disposition    Clinical Impression   Intentional overdose    Plan   1) patient care passed to the oncoming provider.     Roxanne Gates, MD  11/08/21 [id number]

## 2021-11-08 NOTE — Unmapped (Signed)
Sitter bedside. Patient sleeping.

## 2021-11-08 NOTE — Unmapped (Signed)
Bed: C24W  Expected date:   Expected time:   Means of arrival:   Comments:  Marlene Bast, medic 55yr old female, possible overdose. Took 10-525mg  percocet. Narcan given. A&O X4.

## 2021-11-08 NOTE — Unmapped (Signed)
Follow up with your psychiatrist. °

## 2021-11-08 NOTE — Unmapped (Signed)
Report given.  Patient care handed off to [facility]

## 2021-11-09 ENCOUNTER — Encounter: Admit: 2021-11-10 | Payer: PRIVATE HEALTH INSURANCE

## 2021-11-09 DIAGNOSIS — F1020 Alcohol dependence, uncomplicated: Secondary | ICD-10-CM

## 2021-11-09 NOTE — Unmapped (Deleted)
[place]  [facility]  Department of Psychiatry    IOP Discharge Summary      Sherry Compton  [id number]    Admission date:  10/24/2021    Discharge:   Discharge Date: 11/09/2021  Discharge Time: 9:00pm  Discharge Day of Week: Thursday   Length of Stay: 17 days   Type of Discharge: AMA  Discharge Practitioner: Bing Quarry and Mearl Latin  Discharge Remarks/Comments: Patient completed an IOP assessment and 7 IOP group sessions.       Reason for Admission: Alcohol use disorder     Hospital Course:   Patient is a 55 y.o. female who presents with Alcohol use disorder   Patient was admitted on a voluntary basis.  Patient was seen and evaluated by a multidisciplinary treatment team, in this evaluation patient was able to contribute freely. Patient was able to provide informed consent and outline the risks and benefits of medications, therapy , including but not limited to decreased alcohol use, vulnerability.     Primary complaints include:alcohol use.  Onset of symptoms was gradual with increasing course since that time.        Acute Intoxication and Withdrawal Potential: no    Biomedical Conditions and Complications: no    Readiness to change: Unknown    Relapse/Continued use/Continued Problem Potential:   Yes, ama discharge     Recovery Environment:  Yes    Family or caregiver functioning (under age 22):  N/A    Summary of Services provided: 1 assessment and 7 IOP groups.     Course of Treatment/Client's Response to Treatment: Patient appeared to be responding well to treatment but sent a message stating she quit and was unresponsive to attempts to contact.     If Discharge was Involuntary, was client informed of right to appeal: N/A    Medications Prescribed by an Outside Physician (if known): unknown    List Discharge Medications:     Current Outpatient Medications   Medication Sig   ??? naloxone Apply 1 spray in one nostril if needed. Call 911. May repeat dose in other nostril if no response in 3  minutes.   ??? naltrexone Take 1 tablet (50 mg total) by mouth daily.   ??? sulfamethoxazole-trimethoprim One tablet after sex to prevent uti   ??? valACYclovir TAKE 2 TABLETS(2000 MG) BY MOUTH EVERY 12 HOURS FOR 1 DAY FOR COLD SORE   ??? venlafaxine Take 1 capsule (37.5 mg total) by mouth daily for 60 days.     No current facility-administered medications for this visit.       Client interested in referral for further services (if so, please state reason): No, discharged AMA and did not respond to attempts to reach out       Complications: None    Consults: None    Past Psychiatric History:   Anxiety   Currently in treatment with N/A    Substance Abuse History:    Last Use: unknown  Use of Alcohol: Using prior to admission  Use of Caffeine: Yes  Use of OTC: No    Past Medical History:   Diagnosis Date   ??? Anxiety    ??? Wears glasses     readers      Past Surgical History:   Procedure Laterality Date   ??? AUGMENTATION BREAST ENDOSCOPIC     ??? CYSTOSCOPY N/A 11/08/2020    Procedure: CYSTOSCOPY;  Surgeon: Lamona Curl, MD;  Location: [facility] OR;  Service: Gynecology;  Laterality:  N/A;   ??? ENDOMETRIAL ABLATION     ??? gastric sleeve bariatric surgeyr by dr Clementeen Graham     ??? REDUCTION BREAST  01/18/2021   ??? SUSPENSION BLADDER NECK TRANS-VAGINAL SLING N/A 11/08/2020    Procedure: PERINEORRHAPHY, TRANS-VAGINAL TAPE;;  Surgeon: Lamona Curl, MD;  Location: [facility] OR;  Service: Gynecology;  Laterality: N/A;   ??? tummy tuck  04/16/2021   ??? VULVA SURGERY N/A 11/08/2020    Procedure: LABIAPLASTY;  Surgeon: Lamona Curl, MD;  Location: [facility] OR;  Service: Gynecology;  Laterality: N/A;      Social History     Tobacco Use   ??? Smoking status: Former     Types: Cigarettes     Quit date: 11/07/1992     Years since quitting: 29.0   ??? Smokeless tobacco: Never   Substance Use Topics   ??? Alcohol use: No      Family History   Problem Relation Age of Onset   ??? Heart disease Mother         mi at age high 67   ??? Migraines Sister    ??? Diabetes Paternal Grandmother    ??? Heart  disease Paternal Grandmother    ??? Miscarriages / Stillbirths Paternal Grandmother         Education:   Other Pertinent History: None, see assessment note        Is the patient prescribed multiple antipsychotics? No      No Known Allergies     Medical Review Of Systems:  Outside the scope of practice     Psychiatric Review Of Systems:  Sleep: improved   Interest/anhedonia: diminished   Appetite Changes:none  Weight Changes: None   Energy:increased  Libido: unknown  Anxiety/Panic: present  Guilt: decreased   Hopeless:decreased   S.I.B.s/risky behavior:  None      Objective:  Last Vital Signs:  Wt Readings from Last 3 Encounters:   No data found for Wt     Temp Readings from Last 3 Encounters:   No data found for Temp     BP Readings from Last 3 Encounters:   No data found for BP     Pulse Readings from Last 3 Encounters:   No data found for Pulse     Mental Status Evaluation:  Appearance:  Casual, appropriate    Behavior:  Appropriate    Speech:  Normal    Mood:  Euthymic    Affect:  Congruent    Thought Process:  Linear    Thought Content:   Appropriate    Sensorium:   o x 4    Memory: Intact    Cognition:   intact    Insight:  fair   Judgment:  Fair      Assessment/Plan:  1. Alcohol use disorder, severe, dependence (CMS Dx)    Treatment options and alternatives reviewed with patient and they concur with the above plan.  Obtain from [employer].

## 2021-11-09 NOTE — Unmapped (Signed)
IOP Group Note    Date: 11/09/2021    Start Time: 6:00 PM    End Time: 9:00 PM    Duration (min): 180 Minutes    Clinician: Paolina Karwowski, LISW    Program: IOP Outpatient Adult    This was a video visit, including two-way audio and video communication, in lieu of an in-person visit. The patient provided verbal consent to use the video visit.    I spent 180 mins on this call conducting an interview, performing a limited exam by video, with >50% spent educating the patient on my assessment and plan.    Staff to Patient Ratio:  1:11    Group Notes: Skills for Reducing Stress:    This session begins by reviewing information regarding stress, stressors, and the ways in which you deal with stress. Following that, the session examines ways in which you  can become organized, define your priorities, reduce relationship struggles, and seek healthy and supportive relationships. The session reviews such coping strategies as not recovering alone,doing positive things with others, and experiencing the relaxation response.    Individual Note: Patient discussed stressors, triggers, high risk situations, drug of choice, last use, recovery involvement and mood. Patient reported that her drug of choice is alcohol. She is doing well and is very involved in recovery programing and getting involved in groups at her church.     Hour 1: Check in     Hour 2: Skills for Reducing Stress    Hour 3: Skills for Reducing Stress

## 2021-11-13 ENCOUNTER — Encounter

## 2021-11-20 ENCOUNTER — Encounter: Admit: 2021-11-21 | Payer: PRIVATE HEALTH INSURANCE | Attending: Addiction (Substance Use Disorder)

## 2021-11-20 DIAGNOSIS — F1020 Alcohol dependence, uncomplicated: Secondary | ICD-10-CM

## 2021-11-20 NOTE — Unmapped (Signed)
IOP Group Note    Date: 11/20/2021    Start Time: 6:00 PM    End Time: 9:00 PM    Duration (min): 180 Minutes    Clinician: Petra Kuba, LISW-S    Program: IOP Outpatient Adult    Staff to Patient Ratio:  1:10    This was a video visit, including two-way audio and video communication, in lieu of an in-person visit. The patient provided verbal consent to use the video visit.    I spent 180M on this call conducting an interview, performing a limited exam by video, with >50% spent educating the patient on my assessment and plan.    Group Notes:Relapse Prevention:    The session begins with discussions regarding the ways that triggers lead to relapse, different types of triggers, and defusing triggers. Next, the session provides techniques that are designed to avoid triggers and to stop thoughts from leading to relapse.    Individual Note:Emotional/attitudinal check in where each patient shared their drug of choice, last use, recovery involvement, triggers, cravings and therapist/medication.   Sherry Compton reports  That she was using and was going to withdrawal from IOP.  She was actually discharged last Wednesday night.  We are making an addendum for the discharge. She discussed her motivation for recovery and her pending CPS and legal charges as external motivation.  She participated well tonight.     Hour 1: check in.     Hour 2: LIB finished anger and communication and up to exercise 7 on page 7.     Hour 3:  LIB finished anger and communication and up to exercise 7 on page 7.

## 2021-11-22 ENCOUNTER — Encounter: Admit: 2021-11-23 | Payer: PRIVATE HEALTH INSURANCE | Attending: Addiction (Substance Use Disorder)

## 2021-11-22 DIAGNOSIS — F1020 Alcohol dependence, uncomplicated: Secondary | ICD-10-CM

## 2021-11-22 NOTE — Unmapped (Signed)
IOP Group Note    Date: 11/22/2021    Start Time: 6:00 PM    End Time: 9:00 PM    Duration (min): 180 Minutes    Clinician: Petra Kuba, LISW-S    Program: IOP Outpatient Adult    Staff to Patient Ratio:  1:10    This was a video visit, including two-way audio and video communication, in lieu of an in-person visit. The patient provided verbal consent to use the video visit.    I spent 180M on this call conducting an interview, performing a limited exam by video, with >50% spent educating the patient on my assessment and plan.    Group Notes:Relapse Prevention:    The session begins with discussions regarding the ways that triggers lead to relapse, different types of triggers, and defusing triggers. Next, the session provides techniques that are designed to avoid triggers and to stop thoughts from leading to relapse.    Individual Note:Emotional/attitudinal check in where each patient shared their drug of choice, last use, recovery involvement, triggers, cravings and therapist/medication.   Sherry Compton reports that she was discouraged due to a court date.  She received supportive feedback on this.  She was going to drink and then proceeded to think about the consequences.      Hour 1: check in.     Hour 2: Packet and processing.     Hour 3: Packet and processing.

## 2021-11-23 ENCOUNTER — Encounter: Admit: 2021-11-24 | Payer: PRIVATE HEALTH INSURANCE | Attending: Addiction (Substance Use Disorder)

## 2021-11-23 DIAGNOSIS — F1020 Alcohol dependence, uncomplicated: Secondary | ICD-10-CM

## 2021-11-23 NOTE — Unmapped (Signed)
IOP Group Note    Date: 11/23/2021    Start Time: 6:00 PM    End Time: 9:00 PM    Duration (min): 180 Minutes    Clinician: Petra Kuba, LISW-S    Program: IOP Outpatient Adult    Staff to Patient Ratio:  1:9    This was a video visit, including two-way audio and video communication, in lieu of an in-person visit. The patient provided verbal consent to use the video visit.    I spent 180M on this call conducting an interview, performing a limited exam by video, with >50% spent educating the patient on my assessment and plan.    Group Notes: Pleasure Unwoven-In this High Definition Video essay, Monico Hoar explores the arguments for and against this vital debate, reviewing the latest neuroscientific research about addiction along the way. Using the Spectacular landscape of [place] and [employer] to describe the brain areas involved in addiction, Dr. Avelina Laine turns complex neuroscientific concepts into easy-to-understand visual images that will help people in recovery feel better understood, and their families and friends feel hope that recovery is possible.      Individual Note:Emotional/attitudinal check in where each patient shared their drug of choice, last use, recovery involvement, triggers, cravings and therapist/medication.   Jazlene reports that she didn't know about it being a disease but identifies with it beng a sin.      Hour 1: check in.     Hour 2: Film & processing.     Hour 3:  Film & processing.

## 2021-11-24 ENCOUNTER — Ambulatory Visit: Admit: 2021-11-24 | Payer: PRIVATE HEALTH INSURANCE

## 2021-11-24 DIAGNOSIS — B001 Herpesviral vesicular dermatitis: Secondary | ICD-10-CM

## 2021-11-24 MED ORDER — nitrofurantoin, macrocrystal-monohydrate, (MACROBID) 100 MG capsule
100 | ORAL_CAPSULE | ORAL | 2 refills | Status: AC
Start: 2021-11-24 — End: ?

## 2021-11-24 MED ORDER — buPROPion XL (WELLBUTRIN XL) 150 MG 24 hr tablet
150 | ORAL_TABLET | Freq: Every day | ORAL | 2 refills | Status: AC
Start: 2021-11-24 — End: ?

## 2021-11-24 MED ORDER — valACYclovir (VALTREX) 1000 MG tablet
1000 | ORAL_TABLET | ORAL | 3 refills | Status: AC
Start: 2021-11-24 — End: ?

## 2021-11-24 MED ORDER — naltrexone (DEPADE) 50 mg tablet
50 | ORAL_TABLET | Freq: Every day | ORAL | 3 refills | Status: AC
Start: 2021-11-24 — End: ?

## 2021-11-24 NOTE — Unmapped (Signed)
[facility]  [address]  [place] [address]    Subjective:  HPI: Sherry Compton is a(n) 55 y.o. female here for   Chief Complaint   Patient presents with   ??? Medication Refill     All medication and she is looking to get medication for hormones for menopause   On Valtrex:  - Needs refill for fever blister medicine. Takes at start of symptoms. Takes 3 days for total resolution.     Hx of Recurrent UTI:  - takes post coital bactrim.    Depression:  - needs refills of Bupropion for depression. She thinks this is well controlled.    Menopause Symptoms:  - Takes amberen, $50/box. Helsp, but wanting to use something cheaper. Not interested in hormones currently.   - Biggest problem was hot flashes at night or after eating.     ROS: Please see HPI.     Past medical history and past surgical history were reviewed prior to visit:  Past Medical History:   Diagnosis Date   ??? Anxiety    ??? Wears glasses     readers     Past Surgical History:   Procedure Laterality Date   ??? AUGMENTATION BREAST ENDOSCOPIC     ??? CYSTOSCOPY N/A 11/08/2020    Procedure: CYSTOSCOPY;  Surgeon: Lamona Curl, MD;  Location: [facility] OR;  Service: Gynecology;  Laterality: N/A;   ??? ENDOMETRIAL ABLATION     ??? gastric sleeve bariatric surgeyr by dr Clementeen Graham     ??? REDUCTION BREAST  01/18/2021   ??? SUSPENSION BLADDER NECK TRANS-VAGINAL SLING N/A 11/08/2020    Procedure: PERINEORRHAPHY, TRANS-VAGINAL TAPE;;  Surgeon: Lamona Curl, MD;  Location: [facility] OR;  Service: Gynecology;  Laterality: N/A;   ??? tummy tuck  04/16/2021   ??? VULVA SURGERY N/A 11/08/2020    Procedure: LABIAPLASTY;  Surgeon: Lamona Curl, MD;  Location: [facility] OR;  Service: Gynecology;  Laterality: N/A;       Home Meds:   Reviewed medications, see medication list.  Current Outpatient Medications on File Prior to Visit   Medication Sig Dispense Refill   ??? buPROPion XL (WELLBUTRIN XL) 150 MG 24 hr tablet Take 1 tablet (150 mg total) by mouth daily.     ??? naloxone (NARCAN)  4 mg/actuation Spry Apply 1 spray in one nostril if needed. Call 911. May repeat dose in other nostril if no response in 3 minutes. 2 each 1   ??? naltrexone (DEPADE) 50 mg tablet Take 1 tablet (50 mg total) by mouth daily. 30 tablet 0   ??? sulfamethoxazole-trimethoprim (BACTRIM DS) 800-160 mg per tablet One tablet after sex to prevent uti 30 tablet 0   ??? valACYclovir (VALTREX) 1000 MG tablet TAKE 2 TABLETS(2000 MG) BY MOUTH EVERY 12 HOURS FOR 1 DAY FOR COLD SORE 28 tablet 0   ??? venlafaxine (EFFEXOR-XR) 37.5 MG 24 hr capsule Take 1 capsule (37.5 mg total) by mouth daily for 60 days. 30 capsule 1     No current facility-administered medications on file prior to visit.      Reviewed allergies, see allergy list.  No Known Allergies     Objective:  Vitals:    11/24/21 1415   BP: 144/87   Pulse: 74   SpO2: 100%   Weight: 159 lb (72.1 kg)   Height: 5' 9 (1.753 m)     Body mass index is 23.48 kg/m??.     Physical Exam:   General:  Middle aged female in no apparent distress. Normal body habitus.  HEENT:   - Head: Normocephalic and atraumatic.   - Eyes: Conjunctivae are normal. Anicteric sclerae.  Pulmonary/Chest: No respiratory distress on room air.   CV: Regular rate and rhythm without audible murmur.  Neurological: Cranial nerves grossly intact.  Psychiatric: normal mood and affect. Behavior is normal. Judgment and thought content normal.    Assessment/Plan: Sherry Compton is a(n) 55 y.o. female with Depression, Hx of Alcohol Abuse, and Menopause, here for:    Shauna was seen today for medication refill.    Diagnoses and all orders for this visit:    Herpes labialis  -     valACYclovir (VALTREX) 1000 MG tablet; Take two tablets PO BID at start of cold sore symptoms for 1 day    Current moderate episode of major depressive disorder, unspecified whether recurrent (CMS Dx)  -     buPROPion XL (WELLBUTRIN XL) 150 MG 24 hr tablet; Take 1 tablet (150 mg total) by mouth daily.    Alcohol abuse  -     naltrexone (DEPADE) 50 mg tablet;  Take 1 tablet (50 mg total) by mouth daily.    History of recurrent UTIs  -     nitrofurantoin, macrocrystal-monohydrate, (MACROBID) 100 MG capsule; Take one capsule PO after intercourse. Max dose is one capsule per day. Indications: UTI prophylaxis (post-coital use)      Depression: Doing well.  Patient recently in ER for suspected accidental overdose. She appears euthymic and in a good place.  -Refilled bupropion.    UTI Prophylaxis: Previously using Bactrim DS for postcoital UTI prophylaxis.  She states that she also had urethral sling procedure, without improvement in UTI frequency.  I recommended that she switch to Macrobid to prevent resistance to Bactrim.  -Refill sent for postcoital Macrobid.    Alcohol Abuse: Patient is 25 years in remission, until recently. Doing well on Naltrexone.  -Refilled naltrexone.    History of cold sores: No active lesions.  -Refilled valacyclovir.    Follow up: 6-12 months (well visit)      Vallery Ridge, MD  11/24/2021

## 2021-11-27 ENCOUNTER — Encounter: Admit: 2021-11-28 | Payer: PRIVATE HEALTH INSURANCE | Attending: Addiction (Substance Use Disorder)

## 2021-11-27 DIAGNOSIS — F1020 Alcohol dependence, uncomplicated: Secondary | ICD-10-CM

## 2021-11-27 NOTE — Unmapped (Signed)
Sherry Compton of [facility]  Department of Psychiatry    IOP Discharge Summary      Sherry Compton  [id number]    Admission date:   10/24/2021    Discharge:   Discharge Date: 11/27/2021  Discharge Time: 9PM  Discharge Day of Week: Monday  Length of Stay: 11  Type of Discharge: AMA but is incarcerated  Discharge Practitioner: Mearl Latin, LISW-S, LICDC-CS  Discharge Remarks/Comments: Sherry Compton's husband, Sherry Compton, emailed and called stating Sherry Compton was incarcerated at court on Tuesday.  She received 180 days.  The judge is ordering a court ordered treatment program at [facility].  Sherry Compton can attend this. We knew she had court on Tuesday. I suspected she was incarcerated.  While in IOP she often struggled to maintain sobriety, was impaired at group and then was discharged but discharged summary was deleted when she recommitted the next day.  Sherry Compton had 11 sessions and 6 absences.       Reason for Admission: Due to her inability to stop or control her use.  She has CPS and court involvement.  She was very externally motivated.      Compton Course:   Patient is a 54 y.o. female who presents with Substance use disorders.  Patient was admitted on a voluntarily basis.  Patient was seen and evaluated by a multidisciplinary treatment team, in this evaluation patient was able to contribute freely. Patient was able to provide informed consent and outline the risks and benefits of medications, benefits of treatment , including but not limited to pros and cons of mutual support groups.     Primary complaints include: alcohol abuse.  Onset of symptoms was gradual starting several years ago with gradually worsening course since that time.  Progressed to daily use and use of Cocaine, pills, and cannabis.       Acute Intoxication and Withdrawal Potential: yes    Biomedical Conditions and Complications: yes    Readiness to change:  no    Relapse/Continued use/Continued Problem Potential:  yes     Recovery Environment:  yes    Family  or caregiver functioning (under age 42):  not applicable    Summary of Services provided: IOP assessment, plan of care and 11 IOP group sessions.     Course of Treatment/Client's Response to Treatment: Sherry Compton was very externally motivated and struggled maintaining abstinence.  She struggled with attendance as well.  Sherry Compton then recommitted herself to her recovery then was incarcerated.     If Discharge was Involuntary, was client informed of right to appeal: not applicable    Medications Prescribed by an Outside Physician (if known): unknown see below    List Discharge Medications:     Current Outpatient Medications   Medication Sig   ??? buPROPion XL Take 1 tablet (150 mg total) by mouth daily.   ??? naloxone Apply 1 spray in one nostril if needed. Call 911. May repeat dose in other nostril if no response in 3 minutes.   ??? naltrexone Take 1 tablet (50 mg total) by mouth daily.   ??? nitrofurantoin (macrocrystal-monohydrate) Take one capsule PO after intercourse. Max dose is one capsule per day. Indications: UTI prophylaxis (post-coital use)   ??? valACYclovir Take two tablets PO BID at start of cold sore symptoms for 1 day     No current facility-administered medications for this visit.       Client interested in referral for further services (if so, please state reason): Yes Sherry Compton's husband reports  she would like to reenroll when released from jail.      Complications: attendance and using other drugs.  Stress in marriage.    Consults: none utilized/declined.     Past Psychiatric History:   Drug/alcohol  Currently in treatment with [place]..    Substance Abuse History:  cocaine  Last Use: unknown  Use of Alcohol: 4 out of 4 on CAGE  Use of Caffeine: unknown amount  Use of OTC: unknown amount    Past Medical History:   Diagnosis Date   ??? Anxiety    ??? Wears glasses     readers      Past Surgical History:   Procedure Laterality Date   ??? AUGMENTATION BREAST ENDOSCOPIC     ??? CYSTOSCOPY N/A 11/08/2020    Procedure: CYSTOSCOPY;   Surgeon: Lamona Curl, MD;  Location: [facility] OR;  Service: Gynecology;  Laterality: N/A;   ??? ENDOMETRIAL ABLATION     ??? gastric sleeve bariatric surgeyr by dr Clementeen Graham     ??? REDUCTION BREAST  01/18/2021   ??? SUSPENSION BLADDER NECK TRANS-VAGINAL SLING N/A 11/08/2020    Procedure: PERINEORRHAPHY, TRANS-VAGINAL TAPE;;  Surgeon: Lamona Curl, MD;  Location: [facility] OR;  Service: Gynecology;  Laterality: N/A;   ??? tummy tuck  04/16/2021   ??? VULVA SURGERY N/A 11/08/2020    Procedure: LABIAPLASTY;  Surgeon: Lamona Curl, MD;  Location: [facility] OR;  Service: Gynecology;  Laterality: N/A;      Social History     Tobacco Use   ??? Smoking status: Former     Types: Cigarettes     Quit date: 11/07/1992     Years since quitting: 29.0   ??? Smokeless tobacco: Never   Substance Use Topics   ??? Alcohol use: No      Family History   Problem Relation Age of Onset   ??? Heart disease Mother         mi at age high 12   ??? Migraines Sister    ??? Diabetes Paternal Grandmother    ??? Heart disease Paternal Grandmother    ??? Miscarriages / Stillbirths Paternal Grandmother         Education: college graduate  Other Pertinent History: Legal    (Not in a Compton admission)      Is the patient prescribed multiple antipsychotics? No      No Known Allergies     Medical Review Of Systems:  Pertinent items are noted in HPI.    Psychiatric Review Of Systems:  Sleep: fair  Interest/anhedonia: varying  Appetite Changes: varying  Weight Changes: No change  Energy: varying  Libido: varying  Anxiety/Panic:  varying  Guilt: varying   Hopeless: varying  S.I.B.s/risky behavior:  absent    GAF: 55    Objective:  Last Vital Signs:  Wt Readings from Last 3 Encounters:   No data found for Wt     Temp Readings from Last 3 Encounters:   No data found for Temp     BP Readings from Last 3 Encounters:   No data found for BP     Pulse Readings from Last 3 Encounters:   No data found for Pulse       No exam performed today, beyond scope of this clinician.    Mental Status Evaluation:  Appearance:  age  appropriate   Behavior:  normal   Speech:  articulation error   Mood:  normal   Affect:  normal   Thought Process:  normal  Thought Content:  normal   Sensorium:  person, place, time/date and situation   Memory: intact   Cognition:  grossly intact   Insight:  age appropriate   Judgment:  age appropriate       [place] Suicide Severity Scale DC Screener  1. While you were in the Compton, have you wished you were dead or wished you could go to sleep and not wake up?  2. While you were in the Compton have you actually had thoughts about killing yourself?  a. If yes, it asks:  i. Have you been thinking about how you might kill yourself  ii. Have you had these thoughts and had some intention of acting on them or do you have intention of acting on them after you leave the Compton?  iii. Have you started to work out or worked out the details of how to kill yourself either for while you were here in the Compton or after you leave the Compton?  Do you intend to carry out this plan?  3. While in the Compton have you done anything, started to do anything or prepared to do anything to end your life?    Sherry Compton was not available at the time of DC for the [place] DC screener.  However she reported low risk and never reported any suicidal ideations.     Assessment/Plan:  1. Alcohol use disorder, severe, dependence (CMS   Cannabis use disorder moderate  Cocaine use disorder mild  depression      Treatment options and alternatives reviewed with patient and they concur with the above plan.  Obtain from [employer].

## 2021-11-27 NOTE — Unmapped (Signed)
IOP Group Note    Date: 11/27/2021    Start Time: 6:00 PM    End Time: 9:00 PM    Duration (min): 180 Minutes    Clinician: Petra Kuba, LISW-S    Program: IOP Outpatient Adult    Staff to Patient Ratio:  1:10    This was a video visit, including two-way audio and video communication, in lieu of an in-person visit. The patient provided verbal consent to use the video visit.    I spent 180M on this call conducting an interview, performing a limited exam by video, with >50% spent educating the patient on my assessment and plan.    Group Notes:This group was about mutual support groups to enable long-term sobriety.  The mutual support groups listed were 12-Step groups and Non-12-step groups.  We compared and contrasted identifying pros and cons of each.     Individual Note:Emotional/attitudinal check in where each patient shared their drug of choice, last use, recovery involvement, triggers, cravings and therapist/medication.   Sherry Compton reports that she is attending 3 meetings per week. She processed the stress of not going to meetings outweighs the stress of going to meetings.    Hour 1: check in.     Hour 2: Up to trust on part 2 of packet.       Hour 3: Up to trust on part 2 of packet.

## 2021-12-06 ENCOUNTER — Encounter: Payer: PRIVATE HEALTH INSURANCE | Attending: Addiction (Substance Use Disorder)

## 2021-12-07 ENCOUNTER — Encounter: Payer: PRIVATE HEALTH INSURANCE | Attending: Addiction (Substance Use Disorder)

## 2021-12-11 ENCOUNTER — Encounter: Payer: PRIVATE HEALTH INSURANCE | Attending: Addiction (Substance Use Disorder)

## 2021-12-13 ENCOUNTER — Encounter: Payer: PRIVATE HEALTH INSURANCE | Attending: Addiction (Substance Use Disorder)

## 2021-12-14 ENCOUNTER — Encounter: Payer: PRIVATE HEALTH INSURANCE | Attending: Addiction (Substance Use Disorder)

## 2021-12-18 ENCOUNTER — Encounter: Payer: PRIVATE HEALTH INSURANCE | Attending: Addiction (Substance Use Disorder)
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
## 2022-03-28 ENCOUNTER — Ambulatory Visit: Admit: 2022-03-28 | Payer: PRIVATE HEALTH INSURANCE | Attending: Addiction (Substance Use Disorder)

## 2022-03-28 DIAGNOSIS — F1021 Alcohol dependence, in remission: Secondary | ICD-10-CM
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
## 2022-03-28 NOTE — Unmapped (Signed)
IOP PLAN OF CARE      Sherry Compton  [id number]    [facility] PLAN OF CARE:   Admission Date:  03/28/2022  [place] [facility] Level Of Care:  I B  Date Completed ([place] plan):  06/28/2022  Proposed Length of Treatment:  12 weeks  Frequency of Sessions:  3 Times a Week  Plan Name:  IOP Adult  Internal Services Provided:  Mental Health/CD Assessment (Non-Physician) and Behavioral Health Counseling & Therapy  Problem #1: Alcohol and Cannabis Use Disorder    Goal #1:I know I have to be sober to be the best version of myself.  Patient will continue abstaining from all drugs of reward while in IOP.  Date Identified:  06/28/2022   Interventions:Group Psychotherapy  Individual Psychotherapy  Medication Management    Problem #2:Depression    Goal# 2:I'm back to being somewhat depressed right now.  Winda will explore the relationship between her depression and early recovery (PAWS).  Date Identified:  06/28/2022   Interventions:Group Psychotherapy  Individual Psychotherapy  Medication Management   Challenges As Witnessed By: IOP is on zoom.  Kimmy has a history of using against her intent not to.  Raynesha relapsed after 25 years of sobriety.   This plan has been discussed with the patient or guardian: Yes  Patient Participated in Treatment Plan?:  Yes        RN Signature:   Date/Time:    Psychiatrist Signature:  Date/Time:    Higher education careers adviserocial Worker Signature:  Date/Time:    Patient Signature:  Date/Time:        Date:      Treatment Plan to be Updated:

## 2022-03-29 ENCOUNTER — Encounter: Admit: 2022-03-30 | Payer: PRIVATE HEALTH INSURANCE | Attending: Addiction (Substance Use Disorder)

## 2022-03-29 DIAGNOSIS — F1021 Alcohol dependence, in remission: Secondary | ICD-10-CM
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
## 2022-04-02 ENCOUNTER — Encounter: Admit: 2022-04-03 | Payer: PRIVATE HEALTH INSURANCE | Attending: Addiction (Substance Use Disorder)

## 2022-04-02 DIAGNOSIS — F1021 Alcohol dependence, in remission: Secondary | ICD-10-CM

## 2022-04-02 NOTE — Unmapped (Signed)
IOP Group Note    Date: 04/02/2022    Start Time: 6:00 PM    End Time: 9:00 PM    Duration (min): 180 Minutes    Clinician: Petra KubaWilliam Jason Shaia Porath, LISW-S    Program: IOP Outpatient Adult    Staff to Patient Ratio:  1:10    This was a video visit, including two-way audio and video communication, in lieu of an in-person visit. The patient provided verbal consent to use the video visit.    I spent 180M on this call conducting an interview, performing a limited exam by video, with >50% spent educating the patient on my assessment and plan.    Group Notes:Anger and Communication:    This session focuses on various aspects of anger and communication. It reviews the physical signs of and explores unhealthy ways in which people deal with anger. The session reviews several positive ways in which to express anger so as not to harm recovery.    Individual Note:Emotional/attitudinal check in where each patient shared their drug of choice, last use, recovery involvement, triggers, cravings and therapist/medication.   Sherry Compton reports that for her she has had anger issues in the past but mostly if she was drinking.     Hour 1: check in.     Hour 2: Finished negative emotions and made it to page 9 trying to dilute anger    Hour 3: Finished negative emotions and made it to page 9 trying to dilute anger

## 2022-04-09 ENCOUNTER — Encounter: Admit: 2022-04-10 | Payer: PRIVATE HEALTH INSURANCE | Attending: Addiction (Substance Use Disorder)

## 2022-04-09 DIAGNOSIS — F1021 Alcohol dependence, in remission: Secondary | ICD-10-CM
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
## 2022-04-11 ENCOUNTER — Encounter: Admit: 2022-04-12 | Payer: PRIVATE HEALTH INSURANCE

## 2022-04-11 DIAGNOSIS — F1021 Alcohol dependence, in remission: Secondary | ICD-10-CM

## 2022-04-11 NOTE — Unmapped (Signed)
IOP Group Note    Date: 04/11/2022    Start Time: 6:00 PM    End Time: 9:00 PM    Duration (min): 180 Minutes    Clinician: Kearah Gayden D [place]    Program: IOP Outpatient Adult    Staff to Patient Ratio:  1: 8    Group Notes: Relapse Prevention:    The session begins with discussions regarding the ways that triggers lead to relapse, different types of triggers, and defusing triggers. Next, the session provides techniques that are designed to avoid triggers and to stop thoughts from leading to relapse.    Individual Note: Emotional/Attitudinal check in where each patient shared their drug of choice, last use recovery involvement, triggers, cravings, and therapist/medication.  Patient reports that recovery is a high priority for her, in addition to attending IOP she participates in AA sober support group and hopes to start back seeing her psychologist. Triggers for relapse are reported as unstructured time and family issues.    This was a video visit, including two-way audio and video communication, in lieu of an in-person visit. The patient provided verbal consent to use the video visit.    I spent 180M on this call conducting an interview, performing a limited exam by video, with >50% spent educating the patient on my assessment and plan.    Hour 1: Check In    Hour 2: Check In    Hour 3: Session 12 Relapse Prevention Packet

## 2022-04-12 ENCOUNTER — Encounter: Admit: 2022-04-13 | Payer: PRIVATE HEALTH INSURANCE | Attending: Addiction (Substance Use Disorder)

## 2022-04-12 DIAGNOSIS — F1021 Alcohol dependence, in remission: Secondary | ICD-10-CM

## 2022-04-12 NOTE — Unmapped (Signed)
IOP Group Note    Date: 04/12/2022    Start Time: 6:00 PM    End Time: 9:00 PM    Duration (min): 180 Minutes    Clinician: Petra Kuba, LISW-S    Program: IOP Outpatient Adult    Staff to Patient Ratio:  1:7    This was a video visit, including two-way audio and video communication, in lieu of an in-person visit. The patient provided verbal consent to use the video visit.    I spent 180M on this call conducting an interview, performing a limited exam by video, with >50% spent educating the patient on my assessment and plan.    Group Notes:This group was about mutual support groups to enable long-term sobriety.  The mutual support groups listed were 12-Step groups and Non-12-step groups.  We compared and contrasted identifying pros and cons of each.     Individual Note:Emotional/attitudinal check in where each patient shared their drug of choice, last use, recovery involvement, triggers, cravings and therapist/medication.   Sherry Compton reports that she can see AA being a part of her life moving forward.  She is attending 3 meetings per week this weekend.      Hour 1: check in.     Hour 2: Mutual support group packet up to [facility] on page 11.    Hour 3: Mutual support group packet up to [facility] on page 11.

## 2022-04-16 ENCOUNTER — Encounter: Admit: 2022-04-17 | Payer: PRIVATE HEALTH INSURANCE | Attending: Addiction (Substance Use Disorder)

## 2022-04-16 DIAGNOSIS — F1021 Alcohol dependence, in remission: Secondary | ICD-10-CM

## 2022-04-16 NOTE — Unmapped (Signed)
IOP Group Note    Date: 04/16/2022    Start Time: 6:00 PM    End Time: 9:00 PM    Duration (min): 180 Minutes    Clinician: Petra Kuba, LISW-S    Program: IOP Outpatient Adult    Staff to Patient Ratio:  1:6    This was a video visit, including two-way audio and video communication, in lieu of an in-person visit. The patient provided verbal consent to use the video visit.    I spent 180M on this call conducting an interview, performing a limited exam by video, with >50% spent educating the patient on my assessment and plan.    Group Notes:This group was about mutual support groups to enable long-term sobriety.  The mutual support groups listed were 12-Step groups and Non-12-step groups.  We compared and contrasted identifying pros and cons of each.     Individual Note:Emotional/attitudinal check in where each patient shared their drug of choice, last use, recovery involvement, triggers, cravings and therapist/medication.   Sherry Compton reports that for her she is comfortable with the spirituality of AA but doesn't want to impose her HP on others.     Hour 1: check in.     Hour 2: Packet and processing.     Hour 3: Packet and processing.

## 2022-04-18 ENCOUNTER — Encounter: Admit: 2022-04-19 | Payer: PRIVATE HEALTH INSURANCE | Attending: Addiction (Substance Use Disorder)

## 2022-04-18 ENCOUNTER — Encounter

## 2022-04-18 DIAGNOSIS — F1021 Alcohol dependence, in remission: Secondary | ICD-10-CM

## 2022-04-18 NOTE — Unmapped (Deleted)
[facility]  [address], [address]  [place] [address]     The patient's chart was reviewed prior to this ACUTE VISIT as part of pre-visit planning    Today complaining of:***  Symptoms have been going on for:***  Also complains of:***  Denies:***  Treatments tried so far include:***    Past Medical History:   Diagnosis Date   ??? Anxiety    ??? Wears glasses     readers        Current Outpatient Medications   Medication Sig Dispense Refill   ??? buPROPion XL (WELLBUTRIN XL) 150 MG 24 hr tablet Take 1 tablet (150 mg total) by mouth daily. 90 tablet 2   ??? naloxone (NARCAN) 4 mg/actuation Spry Apply 1 spray in one nostril if needed. Call 911. May repeat dose in other nostril if no response in 3 minutes. 2 each 1   ??? naltrexone (DEPADE) 50 mg tablet Take 1 tablet (50 mg total) by mouth daily. 90 tablet 3   ??? nitrofurantoin, macrocrystal-monohydrate, (MACROBID) 100 MG capsule Take one capsule PO after intercourse. Max dose is one capsule per day. Indications: UTI prophylaxis (post-coital use) 6 capsule 2   ??? valACYclovir (VALTREX) 1000 MG tablet Take two tablets PO BID at start of cold sore symptoms for 1 day 4 tablet 3     No current facility-administered medications for this visit.       On Exam:  There were no vitals filed for this visit.    Wt Readings from Last 3 Encounters:   11/24/21 159 lb (72.1 kg)   11/08/21 140 lb (63.5 kg)   10/06/21 138 lb (62.6 kg)       NAD***  Chest: LCTAB  CVS: HS normal,  RRR, no murmur  GI: BS normal, soft , NT  Psych: A&Ox3; reactive affect; no obv thought d/o     Lab Results   Component Value Date    WBC 3.8 11/08/2021    HGB 11.3 (L) 11/08/2021    HCT 35.1 11/08/2021    MCV 90.4 11/08/2021    PLT 250 11/08/2021         Lab Results   Component Value Date    HGBA1C 5.1 12/07/2015    HGBA1C 4.8 10/22/2012       Microalbumin/creatinine ratio  No results found for: MALBCRT     Lab Results   Component Value Date    GLUCOSE 91 11/08/2021    BUN 5 (L)  11/08/2021    CO2 26 11/08/2021    CREATININE 0.61 11/08/2021    K 3.7 11/08/2021    NA 144 11/08/2021    CL 108 11/08/2021    CALCIUM 9.0 11/08/2021    ALBUMIN 4.0 11/08/2021    PROT 7.1 11/08/2021    ALKPHOS 66 11/08/2021    ALT 12 11/08/2021    AST 16 11/08/2021    BILITOT 0.2 11/08/2021       Lab Results   Component Value Date    CHOLTOT 162 12/07/2015    TRIG 63 12/07/2015    HDL 57 (L) 12/07/2015    LDL 92 12/07/2015        Upcoming Health Maintenance     Immunization: Hepatitis A (1 of 2 - Risk 2-dose series)  Overdue - never done    Immunization: Pneumococcal (1 - PCV)  Overdue - never done    Immunization: Zoster (1 of 2)  Overdue - never done  Diabetes Screening (Every 3 Years)  Ordered on 08/23/2021    Comprehensive Physical Exam (Yearly)  Overdue since 08/10/2020    Immunization: COVID-19 (3 - Booster for [employer] series)  Overdue since 09/20/2020    Lipid Panel (Every 5 Years)  Ordered on 08/23/2021    Depression Monitoring (PHQ-9) (Every 3 Months)  Overdue since 11/22/2021    Immunization: Influenza (MyChart) (Season Ended)  Next due on 08/24/2022    Cervical Cancer Screening/Pap Smear (MyChart) (Every 3 Years)  Next due on 11/15/2023    Immunization: DTaP/Tdap/Td (3 - Td or Tdap)  Next due on 11/01/2026    Hepatitis C Screening (MyChart)   Discontinued    Mammogram (MyChart)   Discontinued    Colorectal Cancer Screening (MyChart)   Discontinued           Immunization History   Administered Date(s) Administered   ??? COVID-19, mRNA, Pfizer monovalent 06/18/2020, 07/26/2020   ??? tdap 01/16/2010, 11/01/2016          Time in: ***  Time out:***  TOTAL (including pre-visit planning on this day) : *** mins, > 50% time spent in face to face counseling/coordinating of care: see A&P below for details    A/P:  There are no Patient Instructions on file for this visit.     RTC: No follow-ups on file.     New meds/Patient education given  : see AVS  Patient verbalized understanding & agreement of the proposed plan.

## 2022-04-18 NOTE — Unmapped (Signed)
IOP Group Note    Date: 04/18/2022    Start Time: 6:00 PM    End Time: 9:00 PM    Duration (min): 180 Minutes    Clinician: Petra Kuba, LISW-S    Program: IOP Outpatient Adult    Staff to Patient Ratio:  1:6    This was a video visit, including two-way audio and video communication, in lieu of an in-person visit. The patient provided verbal consent to use the video visit.    I spent 180M on this call conducting an interview, performing a limited exam by video, with >50% spent educating the patient on my assessment and plan.    Group Notes:The 12-steps, Living in Balance.  This session explored the 12-steps of Alcoholics/Narcotics Anonymous and how and why they work.  Each step was discussed in detail and questions in the handout were worked on that pertained to each step.      Individual Note:Emotional/attitudinal check in where each patient shared their drug of choice, last use, recovery involvement, triggers, cravings and therapist/medication.   Sherry Compton reports that for her she sees how her behavior changes when she drinks.  When she takes one drink she immediately wants another.  She also changes her treatment of the people she loves when she starts drinking.     Hour 1: check in.     Hour 2: 12-step packet up to step 9.     Hour 3: 12-step packet up to step 9.

## 2022-04-19 ENCOUNTER — Encounter: Admit: 2022-04-20 | Payer: PRIVATE HEALTH INSURANCE | Attending: Addiction (Substance Use Disorder)

## 2022-04-19 DIAGNOSIS — F1020 Alcohol dependence, uncomplicated: Secondary | ICD-10-CM

## 2022-04-19 NOTE — Unmapped (Signed)
IOP Group Note    Date: 04/19/2022    Start Time: 6:00 PM    End Time: 9:00 PM    Duration (min): 180 Minutes    Clinician: Petra Kuba, LISW-S    Program: IOP Outpatient Adult    Staff to Patient Ratio:  1:7    This was a video visit, including two-way audio and video communication, in lieu of an in-person visit. The patient provided verbal consent to use the video visit.    I spent 180M on this call conducting an interview, performing a limited exam by video, with >50% spent educating the patient on my assessment and plan.    Group Notes:The 12-steps, Living in Balance.  This session explored the 12-steps of Alcoholics/Narcotics Anonymous and how and why they work.  Each step was discussed in detail and questions in the handout were worked on that pertained to each step.      Individual Note:Emotional/attitudinal check in where each patient shared their drug of choice, last use, recovery involvement, triggers, cravings and therapist/medication.   Jorita reports  That she knows that she has to be aware of her behavior even after her use has stopped. She also offered another patient that was completing supportive feedback.      Hour 1: check in.     Hour 2:  Patient gave lead and then up to part 2 of the LIB packet    Hour 3:  Patient gave lead and then up to part 2 of the LIB packet

## 2022-04-23 ENCOUNTER — Encounter: Admit: 2022-04-24 | Payer: PRIVATE HEALTH INSURANCE | Attending: Addiction (Substance Use Disorder)

## 2022-04-23 DIAGNOSIS — F1020 Alcohol dependence, uncomplicated: Secondary | ICD-10-CM

## 2022-04-23 NOTE — Unmapped (Signed)
IOP Group Note    Date: 04/23/2022    Start Time: 6:00 PM    End Time: 9:00 PM    Duration (min): 180 Minutes    Clinician: Petra Kuba, LISW-S    Program: IOP Outpatient Adult    Staff to Patient Ratio:  1:8    This was a video visit, including two-way audio and video communication, in lieu of an in-person visit. The patient provided verbal consent to use the video visit.    I spent 180M on this call conducting an interview, performing a limited exam by video, with >50% spent educating the patient on my assessment and plan.    Group Notes:The 12-steps, Living in Balance.  This session explored the 12-steps of Alcoholics/Narcotics Anonymous and how and why they work.  Each step was discussed in detail and questions in the handout were worked on that pertained to each step.      Individual Note:Emotional/attitudinal check in where each patient shared their drug of choice, last use, recovery involvement, triggers, cravings and therapist/medication.   Sherry Compton reports that she has made some restitution to her family.      Hour 1: check in.     Hour 2: 12-step packet.    Hour 3: 12-step packet.

## 2022-04-25 ENCOUNTER — Encounter: Admit: 2022-04-26 | Payer: PRIVATE HEALTH INSURANCE | Attending: Addiction (Substance Use Disorder)

## 2022-04-25 DIAGNOSIS — F1020 Alcohol dependence, uncomplicated: Secondary | ICD-10-CM

## 2022-04-25 NOTE — Unmapped (Signed)
IOP Group Note    Date: 04/25/2022    Start Time: 6:00 PM    End Time: 9:00 PM    Duration (min): 180 Minutes    Clinician: Petra Kuba, LISW-S    Program: IOP Outpatient Adult    Staff to Patient Ratio:  1:9    This was a video visit, including two-way audio and video communication, in lieu of an in-person visit. The patient provided verbal consent to use the video visit.    I spent 180M on this call conducting an interview, performing a limited exam by video, with >50% spent educating the patient on my assessment and plan.    Group Notes:Nutrition and Exercise:  This session focused on healing mentally, emotionally, and physically from addiction.  We The focus was on nutrition and exercise and how this can enhance their recovery and another way to treat their disease.      Individual Note:Emotional/attitudinal check in where each patient shared their drug of choice, last use, recovery involvement, triggers, cravings and therapist/medication.   Sherry Compton reports that for her she used alcohol as an appetite suppressant so when she got sober she began to eat more.    Hour 1: check in.     Hour 2: LIB packet up to salt on page 16.    Hour 3: LIB packet up to salt on page 16.

## 2022-04-26 ENCOUNTER — Encounter: Admit: 2022-04-27 | Payer: PRIVATE HEALTH INSURANCE | Attending: Addiction (Substance Use Disorder)

## 2022-04-26 DIAGNOSIS — F1020 Alcohol dependence, uncomplicated: Secondary | ICD-10-CM

## 2022-04-26 NOTE — Unmapped (Signed)
IOP Group Note    Date: 04/26/2022    Start Time: 6:00 PM    End Time: 9:00 PM    Duration (min): 180 Minutes    Clinician: Petra Kuba, LISW-S    Program: IOP Outpatient Adult    Staff to Patient Ratio:  1:6    This was a video visit, including two-way audio and video communication, in lieu of an in-person visit. The patient provided verbal consent to use the video visit.    I spent 180M on this call conducting an interview, performing a limited exam by video, with >50% spent educating the patient on my assessment and plan.    Group Notes:Nutrition and Exercise:  This session focused on healing mentally, emotionally, and physically from addiction.  We The focus was on nutrition and exercise and how this can enhance their recovery and another way to treat their disease.      Individual Note:Emotional/attitudinal check in where each patient shared their drug of choice, last use, recovery involvement, triggers, cravings and therapist/medication.   Sherry Compton reports that she is attending meetings. She's having cravings.  She participated in a discussion on exercise.     Hour 1: check in.    Hour 2: Packet and processing & a patient that completed gave a mini-lead.    Hour 3: Packet and processing & a patient that completed gave a mini-lead.

## 2022-04-30 ENCOUNTER — Encounter: Admit: 2022-05-01 | Payer: PRIVATE HEALTH INSURANCE | Attending: Addiction (Substance Use Disorder)

## 2022-04-30 DIAGNOSIS — F1021 Alcohol dependence, in remission: Secondary | ICD-10-CM

## 2022-04-30 NOTE — Unmapped (Signed)
IOP Group Note    Date: 04/30/2022    Start Time: 6:00 PM    End Time: 9:00 PM    Duration (min): 180 Minutes    Clinician: Petra Kuba, LISW-S    Program: IOP Outpatient Adult    Staff to Patient Ratio:  1:7    This was a video visit, including two-way audio and video communication, in lieu of an in-person visit. The patient provided verbal consent to use the video visit.    I spent 180M on this call conducting an interview, performing a limited exam by video, with >50% spent educating the patient on my assessment and plan.    Group Notes:Physical Wellness.  This group focused on two main part of recovery: 1.) Physical Wellness and 2.) Insomnia.  We reviewed the medical consequences of what AOD use can have and the negative effects of insomnia on recovery.      Individual Note:  Emotional/attitudinal check in where each patient shared their drug of choice, last use, recovery involvement, triggers, cravings and therapist/medication.   Sherry Compton reportsLast drink 11/14/21; approx 3-5 mts/week; sponsor yes Sherry Compton) but have only talked at [facility] .Marland Kitchen..need to plan 5th step (has completed the 4th); stressors: boredom and unstructured time and family issues; jail-11/30/21 thru 03/26/22.....106 days of those were treatment; Wellbutrin and naltrexone once/day; both depression and anxiety some, but, meds hep.  Some urge and intrusive thoughts.      Hour 1: check in.     Hour 2: Packet  Up to diabetes.       Hour 3: Packet  Up to diabetes.

## 2022-05-02 ENCOUNTER — Encounter: Admit: 2022-05-03 | Payer: PRIVATE HEALTH INSURANCE | Attending: Addiction (Substance Use Disorder)

## 2022-05-02 DIAGNOSIS — F1020 Alcohol dependence, uncomplicated: Secondary | ICD-10-CM

## 2022-05-02 NOTE — Unmapped (Signed)
IOP Group Note    Date: 05/02/2022    Start Time: 6:00 PM    End Time: 9:00 PM    Duration (min): 180 Minutes    Clinician: Petra Kuba, LISW-S    Program: IOP Outpatient Adult    Staff to Patient Ratio:  1:9    This was a video visit, including two-way audio and video communication, in lieu of an in-person visit. The patient provided verbal consent to use the video visit.    I spent 180M on this call conducting an interview, performing a limited exam by video, with >50% spent educating the patient on my assessment and plan.    Group Notes:Physical Wellness.  This group focused on two main part of recovery: 1.) Physical Wellness and 2.) Insomnia.  We reviewed the medical consequences of what AOD use can have and the negative effects of insomnia on recovery.      Individual Note:Emotional/attitudinal check in where each patient shared their drug of choice, last use, recovery involvement, triggers, cravings and therapist/medication.   Sherry Compton reports  That for her she knows she doesn't want to get too reliant on OTC medications and sees this with people in early recovery.     Hour 1: check in.     Hour 2:  Packet, processing and new member orientation.     Hour 3:  Packet, processing and new member orientation.

## 2022-05-03 ENCOUNTER — Encounter: Admit: 2022-05-04 | Payer: PRIVATE HEALTH INSURANCE | Attending: Addiction (Substance Use Disorder)

## 2022-05-03 DIAGNOSIS — F1020 Alcohol dependence, uncomplicated: Secondary | ICD-10-CM

## 2022-05-03 NOTE — Unmapped (Signed)
IOP Group Note    Date: 05/03/2022    Start Time: 6:00 PM    End Time: 9:00 PM    Duration (min): 180 Minutes    Clinician: Petra Kuba, LISW-S    Program: IOP Outpatient Adult    Staff to Patient Ratio:  1:9    This was a video visit, including two-way audio and video communication, in lieu of an in-person visit. The patient provided verbal consent to use the video visit.    I spent 180M on this call conducting an interview, performing a limited exam by video, with >50% spent educating the patient on my assessment and plan.    Group Notes:Today's session was on healing from toxic  Shame that contributes to anxiety, depression, eating disorders, and addiction.  We viewed a video from Nyra Capes Healing from Toxic Shame.  We explored to come out of hiding is the way you get through the shame and to stay in hiding is to stay in the shame.     Individual Note:Emotional/attitudinal check in where each patient shared their drug of choice, last use, recovery involvement, triggers, cravings and therapist/medication.   Sherry Compton reports that she didn't think she had shame but now sees she did.  She's done a lot of work so she's healed from a lot of it.      Hour 1: check in.     Hour 2:  Film & processing.     Hour 3:  Film & processing.

## 2022-05-07 ENCOUNTER — Encounter: Admit: 2022-05-08 | Payer: PRIVATE HEALTH INSURANCE | Attending: Addiction (Substance Use Disorder)

## 2022-05-07 DIAGNOSIS — F1020 Alcohol dependence, uncomplicated: Secondary | ICD-10-CM

## 2022-05-07 NOTE — Unmapped (Signed)
IOP Group Note    Date: 05/07/2022    Start Time: 6:00 PM    End Time: 9:00 PM    Duration (min): 180 Minutes    Clinician: Petra Kuba, LISW-S    Program: IOP Outpatient Adult    Staff to Patient Ratio:  1:9    This was a video visit, including two-way audio and video communication, in lieu of an in-person visit. The patient provided verbal consent to use the video visit.    I spent 180M on this call conducting an interview, performing a limited exam by video, with >50% spent educating the patient on my assessment and plan.    Group Notes: This session was on problem solving and the problems that accompany addiction and early recovery.  Identify the problem, ask for help, accept the help and help others.      Individual Note:Emotional/attitudinal check in where each patient shared their drug of choice, last use, recovery involvement, triggers, cravings and therapist/medication.   Sherry Compton reports  That for her she knows she can break her problems down to smaller more manageable problems.  She processed her solutions for problem solving.     Hour 1: check in.     Hour 2: Packet and processing up to page 22, Brainstorming.    Hour 3: Packet and processing up to page 22, Brainstorming.

## 2022-05-09 ENCOUNTER — Encounter: Admit: 2022-05-10 | Payer: PRIVATE HEALTH INSURANCE | Attending: Addiction (Substance Use Disorder)

## 2022-05-09 DIAGNOSIS — F1020 Alcohol dependence, uncomplicated: Secondary | ICD-10-CM

## 2022-05-09 NOTE — Unmapped (Signed)
IOP Group Note    Date: 05/09/2022    Start Time: 6:00 PM    End Time: 9:00 PM    Duration (min): 180 Minutes    Clinician: Petra KubaWilliam Jason Penda Venturi, LISW-S    Program: IOP Outpatient Adult    Staff to Patient Ratio:  1:10    This was a video visit, including two-way audio and video communication, in lieu of an in-person visit. The patient provided verbal consent to use the video visit.    I spent 180M on this call conducting an interview, performing a limited exam by video, with >50% spent educating the patient on my assessment and plan.    Group Notes: This session was on problem solving and the problems that accompany addiction and early recovery.  Identify the problem, ask for help, accept the help and help others.      Individual Note:Emotional/attitudinal check in where each patient shared their drug of choice, last use, recovery involvement, triggers, cravings and therapist/medication.   Sherry Compton reports  That she has used these to solve problems.  She offered supportive feedback to a patient that was completing.  She also assisted in orientating newer members to the group.     Hour 1: check in.     Hour 2: Patient gave lead and finished problem solving packet with (TSOC).      Hour 3: Patient gave lead and finished problem solving packet with (TSOC).

## 2022-05-10 ENCOUNTER — Encounter: Admit: 2022-05-11 | Payer: PRIVATE HEALTH INSURANCE | Attending: Addiction (Substance Use Disorder)

## 2022-05-10 DIAGNOSIS — F1020 Alcohol dependence, uncomplicated: Secondary | ICD-10-CM

## 2022-05-10 NOTE — Unmapped (Signed)
IOP Group Note    Date: 05/10/2022    Start Time: 6:00 PM    End Time: 9:00 PM    Duration (min): 180 Minutes    Clinician: Petra Kuba, LISW-S    Program: IOP Outpatient Adult    Staff to Patient Ratio:  1:9    This was a video visit, including two-way audio and video communication, in lieu of an in-person visit. The patient provided verbal consent to use the video visit.    I spent 180M on this call conducting an interview, performing a limited exam by video, with >50% spent educating the patient on my assessment and plan.    Group Notes:This group was on addictive attitudes and beliefs.  We explored irrational beliefs that effect attitude.  He explored rational attitudes and beliefs as well and from a recovering perspective.       Individual Note:Emotional/attitudinal check in where each patient shared their drug of choice, last use, recovery involvement, triggers, cravings and therapist/medication.   Sherry Compton reports that she has negative self talk and knows it's often the enemy talking to her to cast doubt in her mind.      Hour 1: check in.     Hour 2:  Attitudes and Beliefs packet up to page 22 (focusing on your strengths)    Hour 3:  Attitudes and Beliefs packet up to page 22 (focusing on your strengths)

## 2022-05-14 ENCOUNTER — Encounter: Admit: 2022-05-15 | Payer: PRIVATE HEALTH INSURANCE | Attending: Addiction (Substance Use Disorder)

## 2022-05-14 DIAGNOSIS — F1020 Alcohol dependence, uncomplicated: Secondary | ICD-10-CM

## 2022-05-14 NOTE — Unmapped (Addendum)
IOP Group Note    Date: 05/14/2022    Start Time: 6:00 PM    End Time: 7:30 PM    Duration (min): 90 Minutes    Clinician: Petra KubaWilliam Jason Agape Hardiman, LISW-S    Program: IOP Outpatient Adult    Staff to Patient Ratio:  1:11    This was a video visit, including two-way audio and video communication, in lieu of an in-person visit. The patient provided verbal consent to use the video visit.    I spent 70M on this call conducting an interview, performing a limited exam by video, with >50% spent educating the patient on my assessment and plan.    Group Notes:This session focused on Human needs and Social Relationships.  The three main parts are 1.) Human Needs, 2.) Social Relationships, 3.) Introduction to Parenting.  It explores effective and ineffective communication and healthy vs. Unhealthy relationships      Individual Note:Emotional/attitudinal check in where each patient shared their drug of choice, last use, recovery involvement, triggers, cravings and therapist/medication.   Sherry Compton reports that  She is feeling stress tonight due to the Doctor's finding a tumor on her sister.  She was at the hospital and did the session (for 90 minutes or so) at the hospital in a private room.  She was crying and received supportive feedback from her peers about staying sober regardless of what happens.      Hour 1: check in.     Hour 2: Finished attitudes and beliefs.  Went to page page 11 (acceptance) of Human needs and Social Relationships packet. Pa left about halfway through this part due to being at the hospital.  She wanted to do some of group and expressed Half or almost all of group is better than none.     Hour 3: Lavern not present for this part.

## 2022-05-16 ENCOUNTER — Ambulatory Visit: Admit: 2022-05-16 | Discharge: 2022-05-16 | Payer: PRIVATE HEALTH INSURANCE | Attending: Family

## 2022-05-16 ENCOUNTER — Ambulatory Visit: Admit: 2022-05-17 | Payer: PRIVATE HEALTH INSURANCE

## 2022-05-16 ENCOUNTER — Encounter: Admit: 2022-05-17 | Payer: PRIVATE HEALTH INSURANCE | Attending: Addiction (Substance Use Disorder)

## 2022-05-16 DIAGNOSIS — N952 Postmenopausal atrophic vaginitis: Secondary | ICD-10-CM

## 2022-05-16 DIAGNOSIS — Z113 Encounter for screening for infections with a predominantly sexual mode of transmission: Secondary | ICD-10-CM

## 2022-05-16 DIAGNOSIS — F1020 Alcohol dependence, uncomplicated: Secondary | ICD-10-CM

## 2022-05-16 LAB — CHLAMYDIA / GONORRHOEAE DNA SWAB
Chlamydia Trachomatis DNA Swab: NEGATIVE
Neisseria gonorrhoeae DNA Swab: NEGATIVE

## 2022-05-16 MED ORDER — estradioL 10 mcg Tab
10 | ORAL_TABLET | VAGINAL | 12 refills | Status: AC
Start: 2022-05-16 — End: ?

## 2022-05-16 NOTE — Unmapped (Signed)
IOP Group Note    Date: 05/16/2022    Start Time: 6:00 PM    End Time: 9:00 PM    Duration (min): 180 Minutes    Clinician: Petra Kuba, LISW-S    Program: IOP Outpatient Adult    Staff to Patient Ratio:  1:10    This was a video visit, including two-way audio and video communication, in lieu of an in-person visit. The patient provided verbal consent to use the video visit.    I spent 180M on this call conducting an interview, performing a limited exam by video, with >50% spent educating the patient on my assessment and plan.    Group Notes:This group was on the family dynamics of addiction.  We explored the individualized roles in the family that are assumed.  We viewed the film Shattered Spirits.  We then processed this and identified what roles each of the family members played.     Individual Note:Emotional/attitudinal check in where each patient shared their drug of choice, last use, recovery involvement, triggers, cravings and therapist/medication.   Yuritzy reports that her drinking has negatively effected her family.  She understands there is a long period of reconstruction ahead.  She processed her emotions about her sisters cancer.     Hour 1: check in.     Hour 2: Film & processing.     Hour 3: Film & processing.

## 2022-05-16 NOTE — Unmapped (Signed)
Subjective:       Sherry Compton is a 56 y.o. female who presents for evaluation of menopausal symptoms, abnormal vaginal dryness, labial defect; pt reports hot flashes and night sweats were uncomfortable for several years, but have now lessened; vaginal dryness is most distressing, making intercourse painful; reports she used to get very wet with orgasm, but since she had a labiaplasty 10/2020, that has ceased; has concerns with HRT 2/2 her younger sister just diagnosed with stage 3 ovarian cancer, no BRCA testing has been done; also, discussed with pt an abnormal pelvic ultrasound at [facility] on 01/16/21 that reported:  Impression  The endometrial borders are not well-defined, but there is borderline endometrial thickening up to 5 mm. Correlate for menopausal status and abnormal uterine bleeding. In the setting of postmenopausal bleeding, consider endometrial sampling.     Given the surrounding myometrial heterogeneity, underlying adenomyosis is considered.     The left ovary is enlarged with multiple complex cysts, the largest of which measures up to 4.5 cm. A follow-up pelvic ultrasound in 8-12 weeks should be considered for??further evaluation. If persistent, an MRI pelvis with contrast should be considered.     Discussed need for follow-up pelvic ultrasound; pt also reports after her labiaplasty it feels like there is a defect; mentioned it to her surgeon at post-op visits, but was told it would heal normally;     Pt also reports that she is SA with husband, but started drinking again last year after 27yrs of being in recovery from alcoholism and had other partners; was diagnosed and treated for GC during that time, desires TOC;     Past Medical History:   Diagnosis Date   ??? Abnormal Pap smear of cervix    ??? Alcoholism (CMS-HCC)    ??? Anxiety    ??? STD (sexually transmitted disease)     trch, GC, HSV   ??? Wears glasses     readers     Past Surgical History:   Procedure Laterality Date   ??? AUGMENTATION BREAST  ENDOSCOPIC     ??? CYSTOSCOPY N/A 11/08/2020    Procedure: CYSTOSCOPY;  Surgeon: Lamona Curl, MD;  Location: [facility] OR;  Service: Gynecology;  Laterality: N/A;   ??? ENDOMETRIAL ABLATION     ??? gastric sleeve bariatric surgeyr by dr Clementeen Graham     ??? REDUCTION BREAST  01/18/2021   ??? SUSPENSION BLADDER NECK TRANS-VAGINAL SLING N/A 11/08/2020    Procedure: PERINEORRHAPHY, TRANS-VAGINAL TAPE;;  Surgeon: Lamona Curl, MD;  Location: [facility] OR;  Service: Gynecology;  Laterality: N/A;   ??? tummy tuck  04/16/2021   ??? VULVA SURGERY N/A 11/08/2020    Procedure: LABIAPLASTY;  Surgeon: Lamona Curl, MD;  Location: [facility] OR;  Service: Gynecology;  Laterality: N/A;     Family History   Problem Relation Age of Onset   ??? Cancer Mother         Cervical   ??? Stroke Mother 33        05/13/22   ??? Heart disease Mother         mi at age high 28   ??? Cancer Sister 82        Ovarian Cancer   ??? Migraines Sister    ??? Diabetes Paternal Grandmother    ??? Heart disease Paternal Grandmother    ??? Miscarriages / Stillbirths Paternal Grandmother      Social History     Tobacco Use   ??? Smoking status: Former     Types: Cigarettes  Quit date: 11/07/1992     Years since quitting: 29.5   ??? Smokeless tobacco: Never   Vaping Use   ??? Vaping Use: Never used   Substance Use Topics   ??? Alcohol use: No   ??? Drug use: Yes     Types: Medical Marijuana     No Known Allergies        Review of Systems  Constitutional: negative  Genitourinary:positive for vag dryness, labial defect  Allergic/Immunologic: negative      Objective:      BP 118/69 (BP Location: Right arm, Patient Position: Sitting, BP Cuff Size: Large)    Pulse 75    Ht 5' 9.5 (1.765 m)    Wt 181 lb 9.6 oz (82.4 kg)    BMI 26.43 kg/m??   General appearance: alert, appears stated age and cooperative  Pelvic: cervix normal in appearance, no adnexal masses or tenderness, no cervical motion tenderness, positive findings: L labia minora adhesed tissue, rectovaginal septum normal, uterus normal size, shape, and consistency and vagina  normal without discharge  Lymph nodes: Cervical, supraclavicular, and axillary nodes normal.      Assessment:     1. Atrophic vaginitis  estradioL 10 mcg Tab      2. Screen for sexually transmitted diseases  Chlamydia / Gonorrhoeae DNA Swab      3. Enlarged ovary  US Transvaginal    US Pelvis non-OB complete      4. Acquired labial adhesion  Amb referral to Urogynecology          Plan:   Ask Sister to be tested for BRCA   referral to urogyn    Start vagifem twice weekly  Pelvic US  Await results

## 2022-05-17 ENCOUNTER — Encounter: Admit: 2022-05-18 | Payer: PRIVATE HEALTH INSURANCE | Attending: Addiction (Substance Use Disorder)

## 2022-05-17 DIAGNOSIS — F1020 Alcohol dependence, uncomplicated: Secondary | ICD-10-CM

## 2022-05-17 NOTE — Unmapped (Signed)
IOP Group Note    Date: 05/17/2022    Start Time: 6:00 PM    End Time: 9:00 PM    Duration (min): 180 Minutes    Clinician: Petra Kuba, LISW-S    Program: IOP Outpatient Adult    Staff to Patient Ratio:  1:9    This was a video visit, including two-way audio and video communication, in lieu of an in-person visit. The patient provided verbal consent to use the video visit.    I spent 180M on this call conducting an interview, performing a limited exam by video, with >50% spent educating the patient on my assessment and plan.    Group Notes:This session focused on Human needs and Social Relationships.  The three main parts are 1.) Human Needs, 2.) Social Relationships, 3.) Introduction to Parenting.  It explores effective and ineffective communication and healthy vs. Unhealthy relationships      Individual Note:Emotional/attitudinal check in where each patient shared their drug of choice, last use, recovery involvement, triggers, cravings and therapist/medication.   Sherry Compton reports that for her she knows the importance of practicing spiritual principles in her relationships.  She shared about some recent hardships in her life.  She seems to be in a good place with this spiritually despite the calamity.      Hour 1: check in.      Hour 2: Human Needs and Social relationship Packet up to part 2      Hour 3: Human Needs and Social relationship Packet up to part 2

## 2022-05-21 ENCOUNTER — Encounter: Admit: 2022-05-22 | Payer: PRIVATE HEALTH INSURANCE

## 2022-05-21 DIAGNOSIS — F1020 Alcohol dependence, uncomplicated: Secondary | ICD-10-CM

## 2022-05-21 NOTE — Unmapped (Signed)
IOP Group Note    Date: 05/21/2022    Start Time: 6:00 PM    End Time: 9:00 PM    Duration (min): 180 Minutes    Clinician: Jearld Hemp D [place]    Program: IOP Outpatient Adult    Staff to Patient Ratio:  1: 4    Group Note:Family Matters  Packet,  Patients looked at what makes a family become dysfunctional. Reviewed the things that children who grew up in dysfunctional families learn to believe about self's and others. Examined roles in the dysfunctional family and how these roles carry into adulthood. Examined ways to overcome hurt,anger and fear and engage in forgiveness and reconciliation.      Individual Note: Emotional/Attitudinal check in where each patient shared their drug of choice, last use recovery involvement, triggers, cravings, and therapist/medication.  Patient reports that she has felt overwhelmed this week with family issues and medical concerns, she was able to share with group these concerns and was open to feedback. Patient reports that recovery is a high priority for her and that to enhance her recovery process she attends church, meets with a counselor, attends  Sober support group of [facility] and participates in IOP.    This was a video visit, including two-way audio and video communication, in lieu of an in-person visit. The patient provided verbal consent to use the video visit.    I spent 180M on this call conducting an interview, performing a limited exam by video, with >50% spent educating the patient on my assessment and plan.    Hour 1: Check In    Hour 2: Living and Balance Session 22 Family Matters    Hour 3: Living and Balance Session 22 Family Matters

## 2022-05-23 ENCOUNTER — Encounter: Admit: 2022-05-24 | Payer: PRIVATE HEALTH INSURANCE

## 2022-05-23 ENCOUNTER — Inpatient Hospital Stay: Admit: 2022-05-23 | Payer: PRIVATE HEALTH INSURANCE | Attending: Family

## 2022-05-23 DIAGNOSIS — F1020 Alcohol dependence, uncomplicated: Secondary | ICD-10-CM

## 2022-05-23 DIAGNOSIS — N838 Other noninflammatory disorders of ovary, fallopian tube and broad ligament: Secondary | ICD-10-CM

## 2022-05-23 NOTE — Unmapped (Signed)
IOP Group Note    Date: 05/23/2022    Start Time: 6:00 PM    End Time: 9:00 PM    Duration (min): 180 Minutes    Clinician: Pankaj Haack D [place]    Program: IOP Outpatient Adult    Staff to Patient Ratio:  1: 11    Group Notes:  Human Needs and Relationships  Part 2 Session focused on social relationships. Reviewed and discussed the importance of social relationships, why you need to take risks and reveal your feelings to others, understand how to develop healthy relationships in recovery.    Individual Note: Emotional/Attitudinal check in where each patient shared their drug of choice, last use recovery involvement, triggers, cravings, and therapist/medication.  Patient reportsthat she has been under a lot of pressure regarding making a decision to move into an apartment away from her husband which would allow her teenage son to move back home in accordance with CPS rules. Patient reports that she is attempting to rebuild her relationship with her son since entering recovery.     This was a video visit, including two-way audio and video communication, in lieu of an in-person visit. The patient provided verbal consent to use the video visit.    I spent 180M on this call conducting an interview, performing a limited exam by video, with >50% spent educating the patient on my assessment and plan.    Hour 1: Check In    Hour 2: Living and Balance Session 21 Part 2 Social Relationships    Hour 3: Living and Balance Session 21 Part 2 Social Relationships

## 2022-05-24 ENCOUNTER — Encounter: Admit: 2022-05-25 | Payer: PRIVATE HEALTH INSURANCE | Attending: Addiction (Substance Use Disorder)

## 2022-05-24 DIAGNOSIS — F1020 Alcohol dependence, uncomplicated: Secondary | ICD-10-CM

## 2022-05-24 NOTE — Unmapped (Signed)
IOP Group Note    Date: 05/24/2022    Start Time: 6:00 PM    End Time: 9:00 PM    Duration (min): 180 Minutes    Clinician: Petra KubaWilliam Jason Daylon Lafavor, LISW-S    Program: IOP Outpatient Adult    Staff to Patient Ratio:  1:9    This was a video visit, including two-way audio and video communication, in lieu of an in-person visit. The patient provided verbal consent to use the video visit.    I spent 180M on this call conducting an interview, performing a limited exam by video, with >50% spent educating the patient on my assessment and plan.    Group Notes:This group was a supplemental handout on You and Your parents. This provided information on styles of parenting, as well as more specific information about the particular roles mothers and fathers play in a child's life.  You will also consider how your mother and father's behavior toward you as a child continues to influence you today.     Individual Note:Emotional/attitudinal check in where each patient shared their drug of choice, last use, recovery involvement, triggers, cravings and therapist/medication.   Sherry Compton reports that she was often too strict on her kids.  She received supportive feedback on this.  She is also moving out so her husband can reunite with their child.  She was asked about this being high risk for her for relapse with no accountability and being in an apartment on her own.     Hour 1: check in.     Hour 2: Packet up to part 2 (your mother).    Hour 3: Packet up to part 2 (your mother).

## 2022-05-24 NOTE — Unmapped (Signed)
IOP PLAN OF CARE      Sherry Compton  [id number]    [facility]   Admission Date:  03/28/2022  [place] [facility] Level Of Care:  I B  Date Completed ([place] plan):  08/26/2022  Proposed Length of Treatment:  12 weeks  Frequency of Sessions:  3 Times a Week  Plan Name:  IOP Adult  Internal Services Provided:  Mental Health/CD Assessment (Non-Physician), CD Counseling & Therapy and Behavioral Health Counseling & Therapy  Problem #1: Alcohol Use Disorder severe    Goal #1:Sobriety is my priority because without it I don't have anything else.  Sherry Compton will abstain from all drugs of reward while in IOP.   Date Identified:  08/26/2022   Interventions:Group Psychotherapy  Individual Psychotherapy     Problem #2:Depression   Goal# 2:Depression has been a trigger.  I want to keep working on that.  Sherry Compton will continue developing abstinence based coping skills for her depression.  Date Identified:  08/26/2022   Interventions:Group Psychotherapy  Individual Psychotherapy  Medication Management   Challenges As Witnessed By: Drinking despite intent not to.   This plan has been discussed with the patient or guardian: Yes  Patient Participated in Treatment Plan?:  Yes        RN Signature:   Date/Time:    Psychiatrist Signature:  Date/Time:    Higher education careers adviserocial Worker Signature:  Date/Time:    Patient Signature:  Date/Time:        Date:      Treatment Plan to be Updated:

## 2022-05-28 ENCOUNTER — Encounter: Admit: 2022-05-29 | Payer: PRIVATE HEALTH INSURANCE | Attending: Addiction (Substance Use Disorder)

## 2022-05-28 DIAGNOSIS — F1020 Alcohol dependence, uncomplicated: Secondary | ICD-10-CM

## 2022-05-28 DIAGNOSIS — F1021 Alcohol dependence, in remission: Secondary | ICD-10-CM

## 2022-05-28 NOTE — Unmapped (Signed)
IOP Group Note    Date: 05/28/2022    Start Time: 6:00 PM    End Time: 9:00 PM    Duration (min): 180 Minutes    Clinician: Petra Kuba, LISW-S    Program: IOP Outpatient Adult    Staff to Patient Ratio:  1:8    This was a video visit, including two-way audio and video communication, in lieu of an in-person visit. The patient provided verbal consent to use the video visit.    I spent 180M on this call conducting an interview, performing a limited exam by video, with >50% spent educating the patient on my assessment and plan.    Group Notes:This group was a supplemental handout on You and Your parents. This provided information on styles of parenting, as well as more specific information about the particular roles mothers and fathers play in a child's life.  You will also consider how your mother and father's behavior toward you as a child continues to influence you today.     Individual Note:Emotional/attitudinal check in where each patient shared their drug of choice, last use, recovery involvement, triggers, cravings and therapist/medication.   Sherry Compton reports that her Father had alcoholism. He suffered from this and would often be arrested.  This embarrassed Sherry Compton.  She offered another supportive feedback.     Hour 1: check in.     Hour 2: You and your parents packet.      Hour 3: You and your parents packet.

## 2022-05-30 ENCOUNTER — Encounter: Admit: 2022-05-31 | Payer: PRIVATE HEALTH INSURANCE | Attending: Addiction (Substance Use Disorder)

## 2022-05-30 DIAGNOSIS — F1020 Alcohol dependence, uncomplicated: Secondary | ICD-10-CM

## 2022-05-30 NOTE — Unmapped (Signed)
IOP Group Note    Date: 05/30/2022    Start Time: 6:00 PM    End Time: 9:00 PM    Duration (min): 180 Minutes    Clinician: Petra KubaWilliam Jason Ariani Seier, LISW-S    Program: IOP Outpatient Adult    Staff to Patient Ratio:  1:9    This was a video visit, including two-way audio and video communication, in lieu of an in-person visit. The patient provided verbal consent to use the video visit.    I spent 180M on this call conducting an interview, performing a limited exam by video, with >50% spent educating the patient on my assessment and plan.    Group Notes:Addiction and Loss:  This session of The Living in Balance series focused on the The loss that comes with addiction (Freedom, relationships, quality of relationships, financial, job/career, family and identity)    Individual Note:Emotional/attitudinal check in where each patient shared their drug of choice, last use, recovery involvement, triggers, cravings and therapist/medication.   Sherry Compton reports  That she told her husband she wasn't interested in him physically and has not been attracted to him. She shared on grief and loss.  She received supportive feedback on this.     Hour 1: check in.     Hour 2: Addiction and loss up to page 11, job loss.     Hour 3: Addiction and loss up to page 11, job loss.

## 2022-05-31 ENCOUNTER — Encounter: Admit: 2022-06-01 | Payer: PRIVATE HEALTH INSURANCE

## 2022-05-31 DIAGNOSIS — F1020 Alcohol dependence, uncomplicated: Secondary | ICD-10-CM

## 2022-05-31 NOTE — Unmapped (Signed)
IOP Group Note    Date: 05/31/2022    Start Time: 6:00 PM    End Time: 9:00 PM    Duration (min): 180 Minutes    Clinician: Aarian Griffie, LISW    Program: IOP Outpatient Adult    Staff to Patient Ratio:  1:8    This was a video visit, including two-way audio and video communication, in lieu of an in-person visit. The patient provided verbal consent to use the video visit.    I spent 180 mins on this call conducting an interview, performing a limited exam by video, with >50% spent educating the patient on my assessment and plan.      Group Notes:  Addiction and Loss   This module focused on addiction and loss. Members discussed things that they lost because of their addiction. Members examined how loss can be used to strengthen recovery. Members then examined things that they will lose in sobriety. Members wrapped up by discussing recovering losses in recovery.     Individual Note: Patient discussed stressors, triggers, high risk situations, drug of choice, last use, recovery involvement and mood. Patient reported her drug of choice was alcohol. Patient participated in group. At some point patient was listen but started folding clothes during group.     Hour 1: Check in     Hour 2: Addiction and loss     Hour 3: Addiction and loss

## 2022-06-01 ENCOUNTER — Ambulatory Visit: Admit: 2022-06-01 | Payer: PRIVATE HEALTH INSURANCE

## 2022-06-01 DIAGNOSIS — N9089 Other specified noninflammatory disorders of vulva and perineum: Secondary | ICD-10-CM

## 2022-06-01 MED ORDER — lidocaine (PF) 2% (20 mg/mL) Soln 20 mg
20 | Freq: Once | INTRAMUSCULAR | Status: AC | PRN
Start: 2022-06-01 — End: 2022-06-01

## 2022-06-01 NOTE — Unmapped (Signed)
Patient declined chaperone for intimate examination.

## 2022-06-01 NOTE — Unmapped (Signed)
Female Pelvic Medicine and Reconstructive Surgery New Patient Evaluation  Subjective:     Patient Name:  Sherry Compton  MR #:  [id number]  Date of Birth: 1966/10/19    06/01/2022    Chief Complaint: acquired labial abnormality    Dear Vernona Rieger Power, NP and Veatrice Kells, MD    I had the pleasure of seeing your patient,. Sherry Compton, in my clinic today. Thank you for referring her for her evaluation.  I have enclosed a copy of my initial consultation for your records. I will certainly keep you updated regarding her progress.    History of Present Illness:   [name] is a 56 y.o. old para 4 woman, seen at the kind request of Vernona Rieger Power, NP for an acquired labial abnormality. S/p MUS and bilateral labiaplasty in 2021 with Dr. Lamona Curl. Immediately postoperatively developed a large defect in her L labia minora; was told by her surgeon this would heal with time but it didn't. Causing irritation and hygienic difficulty.     Current Outpatient Medications on File Prior to Visit   Medication Sig Dispense Refill   ??? buPROPion XL (WELLBUTRIN XL) 150 MG 24 hr tablet Take 1 tablet (150 mg total) by mouth daily. 90 tablet 2   ??? estradioL 10 mcg Tab Place 10 mcg vaginally every Monday and Thursday. 8 tablet 12   ??? naltrexone (DEPADE) 50 mg tablet Take 1 tablet (50 mg total) by mouth daily. 90 tablet 3   ??? nitrofurantoin, macrocrystal-monohydrate, (MACROBID) 100 MG capsule Take one capsule PO after intercourse. Max dose is one capsule per day. Indications: UTI prophylaxis (post-coital use) 6 capsule 2   ??? valACYclovir (VALTREX) 1000 MG tablet Take two tablets PO BID at start of cold sore symptoms for 1 day 4 tablet 3   ??? naloxone (NARCAN) 4 mg/actuation Spry Apply 1 spray in one nostril if needed. Call 911. May repeat dose in other nostril if no response in 3 minutes. 2 each 1     No current facility-administered medications on file prior to visit.       Past Medical History:   Diagnosis Date   ??? Abnormal Pap smear of cervix     ??? Alcoholism (CMS-HCC)    ??? Anxiety    ??? STD (sexually transmitted disease)     trch, GC, HSV   ??? Wears glasses     readers       Past Surgical History:   Procedure Laterality Date   ??? AUGMENTATION BREAST ENDOSCOPIC     ??? CYSTOSCOPY N/A 11/08/2020    Procedure: CYSTOSCOPY;  Surgeon: Lamona Curl, MD;  Location: [facility] OR;  Service: Gynecology;  Laterality: N/A;   ??? ENDOMETRIAL ABLATION     ??? gastric sleeve bariatric surgeyr by dr Clementeen Graham     ??? REDUCTION BREAST  01/18/2021   ??? SUSPENSION BLADDER NECK TRANS-VAGINAL SLING N/A 11/08/2020    Procedure: PERINEORRHAPHY, TRANS-VAGINAL TAPE;;  Surgeon: Lamona Curl, MD;  Location: [facility] OR;  Service: Gynecology;  Laterality: N/A;   ??? tummy tuck  04/16/2021   ??? VULVA SURGERY N/A 11/08/2020    Procedure: LABIAPLASTY;  Surgeon: Lamona Curl, MD;  Location: [facility] OR;  Service: Gynecology;  Laterality: N/A;           Objective:   PHYSICAL EXAMINATION:     Vitals:    06/01/22 0821   BP: 108/62   Pulse: 59   SpO2: 98%   Weight: 182 lb (82.6 kg)  She is alert and oriented and appears comfortable.    Normal affect and mood.  Gu/Pelvic:   -1.5 hole in left labia minora with thin skin bridge on medial edge  -Otherwise normal vulva  -No mesh erosion on bimanual exam  -No prolapse    Records Review:    - See body of note for supportive documentation related to level of service  - I reviewed the following prior notes: OB/GYN including operative note from MUS, Labiaplasty  - I reviewed prior test results  ??? Labs reviewed:  ??? Imaging reviewed:   ??? Other tests reviewed:      Assessment:   Assessment    1. Lesion of labia        2. Labial irritation          Plan:   #Defect of labia; iatrogenic  -S/p labiaplasty in 2021 with large hole/defect in L labia minora causing vulvar irritation and hygienic concerns  -Discussed management options including expectant management versus surgical revision; prefers revision  -Discussed risks of labial surgery including infection, wound dehiscence, scarring,  dyspareunia, inferior cosmetic outcome; she voiced understanding  -Will schedule for August 7th; does not need preop. Will sign consent on DOS.     Thank you for the opportunity to evaluate and care for your patient, I look forward to working together to treat her to ensure she receives the very best care.  Please do not hesitate to contact  me if you have any questions or concerns. I will keep you up to date as to relevant evaluations, treatment options, and surgical intervention if indicated.    Kind regards,    Bron Snellings C. Zachery Dauer, MD  Division of Female Pelvic Medicine & Reconstructive Surgery

## 2022-06-04 ENCOUNTER — Encounter: Admit: 2022-06-05 | Payer: PRIVATE HEALTH INSURANCE

## 2022-06-04 DIAGNOSIS — F1020 Alcohol dependence, uncomplicated: Secondary | ICD-10-CM

## 2022-06-04 NOTE — Unmapped (Signed)
IOP Group Note    Date: 06/04/2022    Start Time: 6:00 PM    End Time: 9:00 PM    Duration (min): 180 Minutes    Clinician: Abisola Carrero D [place]    Program: IOP Outpatient Adult    Staff to Patient Ratio:  1: 8    Group Notes:  Grief responding to loss discussion concentrated on understanding what grief is, how people react to grief, including both normal and abnormal reactions. Understanding some of the things that influence the strength of people's feelings of grief.    Individual Note: Emotional/Attitudinal check in where each patient shared their drug of choice, last use recovery involvement, triggers, cravings, and therapist/medication.  Patient reports that she moved to an apartment , away from her home in order for her son to move back into the family home and she believes that this will help with the loss she felt when her son was court ordered to live with her eldest adult daughter. Patient reports attending a court hearing to have her drivers license reinstated today but the judge denied the request and is hoping that her next hearing in 2 months may assitt her in this reinstatement.     This was a video visit, including two-way audio and video communication, in lieu of an in-person visit. The patient provided verbal consent to use the video visit.    I spent 180M on this call conducting an interview, performing a limited exam by video, with >50% spent educating the patient on my assessment and plan.    Hour 1: Check In    Hour 2: Living and Balance Session 31 Grief Responding to loss    Hour 3: Living and Balance Session 31 Grief Responding to loss

## 2022-06-06 ENCOUNTER — Encounter: Admit: 2022-06-07 | Payer: PRIVATE HEALTH INSURANCE | Attending: Addiction (Substance Use Disorder)

## 2022-06-06 DIAGNOSIS — F1020 Alcohol dependence, uncomplicated: Secondary | ICD-10-CM

## 2022-06-06 NOTE — Unmapped (Addendum)
IOP Group Note    Date: 06/06/2022    Start Time: 6:00 PM    End Time: 9:00 PM    Duration (min): 180 Minutes    Clinician: Petra KubaWilliam Jason Docia Klar, LISW-S    Program: IOP Outpatient Adult    Staff to Patient Ratio:  1:7    Group Notes:This group focused on the Sprituality and personality change. Old using behaviors where reviewed and how they contradict recovering behaviors.  We examined values and beliefs and how spirituality can bring about a personality change that can assist with long-term abstinence.      This was a video visit, including two-way audio and video communication, in lieu of an in-person visit. The patient provided verbal consent to use the video visit.    I spent 110 or more minutes  on this call conducting an interview, performing a limited exam by video, with >50% spent educating the patient on my assessment and plan.     Individual Note:Emotional/attitudinal check in where each patient shared their drug of choice, last use, recovery involvement, triggers, cravings and therapist/medication.   Sherry Compton reports that her last date of use 11/14/2021.  Her meeting attendance is attending 3 meetings per week.  Some weeks she does all zoom other weeks she does in person and the other on zoom.  She doesn't have a sponsor (she has someone that said she would but she's not called her).  She doesn't have driving privileges.  She went to court on Monday.  Her biggest stressors are her mother and sister's health (Mother had a stroke and her sister has stage 4 ovarian cancer).  Sherry Compton is helping with both of them.  Also her son is not back in the home from being taken CPS due to Sherry Compton's use which has caused her great stress and depression.  She moved out of her house last week so her son could move back in.  This has cause her some depression and anxiety due to her son being with her 56 year old married daughter that doesn't want anything to do with Sherry Compton due to her use.  Sherry Compton had 25 years of sobriety at one time  and relapsed.  Sherry Compton is taking Sherry Compton 150MG  once per day in the AM, Sherry Compton is 50MG  in the AM. She reports some cravings and  urges at this time but has been in some high risk situation.  She has a lot of conflict in her marriage due to numerous affairs when she was drinking.  She's on probation.  Her MSE is not remarkable.  No suicidal ideations.  She is doing family counseling with her family and church.  She sees her therapist (Clinical Counselor) to process these stressors but is still overwhelmed.  Sherry Compton would like the accountability now of living in an apartment by herself, walking her sister through certain death and her son who's 56 years old (Sherry Compton's nephew).  She does have some cravings. Sherry Compton moved to from Sherry Compton to Sherry Compton twp to be close to her mother and sister to help them in addition to not being allowed in her home.      Hour 1: check in.     Hour 2: Finished packet 31 and began 3732 (spirituality and personality change up to 13 acceptance).     Hour 3: Finished packet 31 and began 4732 (spirituality and personality change up to 13 acceptance).

## 2022-06-07 ENCOUNTER — Encounter: Admit: 2022-06-08 | Payer: PRIVATE HEALTH INSURANCE | Attending: Addiction (Substance Use Disorder)

## 2022-06-07 DIAGNOSIS — F1020 Alcohol dependence, uncomplicated: Secondary | ICD-10-CM

## 2022-06-07 NOTE — Unmapped (Signed)
IOP Group Note    Date: 06/07/2022    Start Time: 6:00 PM    End Time: 9:00 PM    Duration (min): 180 Minutes    Clinician: Petra KubaWilliam Jason Takyla Kuchera, LISW-S    Program: IOP Outpatient Adult    Staff to Patient Ratio:  1:8    This was a video visit, including two-way audio and video communication, in lieu of an in-person visit. The patient provided verbal consent to use the video visit.    I spent 110 or more minutes  on this call conducting an interview, performing a limited exam by video, with >50% spent educating the patient on my assessment and plan.     Group Notes:This session  focused on basic aspects of relapse prevention plus a few more challenging aspects. The session begins with a review of the ways that triggers lead to relapse, different types of triggers, and defusing triggers. Next, the session provides techniques that are designed to avoid triggers and to stop thoughts from leading to relapses. Then you will be asked to identify areas in your life that can help you to visualize the building of a mental wall to prevent relapse. Finally, you will be asked to participate in an exercise to examine your trust of others in helping you with recovery.    Individual Note:Emotional/attitudinal check in where each patient shared their drug of choice, last use, recovery involvement, triggers, cravings and therapist/medication.   Sherry Compton reports  that getting reunited with her child and making restitution with her family along with staying sober are her most important goals.      Hour 1: check in.     Hour 2: Finished session 32 (spirituality and personality) and made it to part 2 of advanced relapse prevention (session 33).     Hour 3: Finished session 32 (spirituality and personality) and made it to part 2 of advanced relapse prevention (session 33).

## 2022-06-11 ENCOUNTER — Encounter: Admit: 2022-06-12 | Payer: PRIVATE HEALTH INSURANCE | Attending: Addiction (Substance Use Disorder)

## 2022-06-11 DIAGNOSIS — F1020 Alcohol dependence, uncomplicated: Secondary | ICD-10-CM

## 2022-06-11 NOTE — Unmapped (Signed)
IOP Group Note    Date: 06/11/2022    Start Time: 6:00 PM    End Time: 9:00 PM    Duration (min): 180 Minutes    Clinician: Petra Kuba, LISW-S    Program: IOP Outpatient Adult    Staff to Patient Ratio:  1:8    This was a video visit, including two-way audio and video communication, in lieu of an in-person visit. The patient provided verbal consent to use the video visit.    I spent 110 or more minutes  on this call conducting an interview, performing a limited exam by video, with >50% spent educating the patient on my assessment and plan.         Group Notes:This session  focused on basic aspects of relapse prevention plus a few more challenging aspects. The session begins with a review of the ways that triggers lead to relapse, different types of triggers, and defusing triggers. Next, the session provides techniques that are designed to avoid triggers and to stop thoughts from leading to relapses. Then you will be asked to identify areas in your life that can help you to visualize the building of a mental wall to prevent relapse. Finally, you will be asked to participate in an exercise to examine your trust of others in helping you with recovery.    Individual Note:Emotional/attitudinal check in where each patient shared their drug of choice, last use, recovery involvement, triggers, cravings and therapist/medication.   Sherry Compton reports  that for her she trust people too much.  She knows trust is an essential part of recovery.  She is trusting people too much, too soon.      Hour 1: check in.     Hour 2: Finished advanced relapse prevention packet and began session 38, Effects on Substance Use on Mental Health, up to part 1 and reviewed the glossary.     Hour 3: Finished advanced relapse prevention packet and began session 38, Effects on Substance Use on Mental Health, up to part 1 and reviewed the glossary.

## 2022-06-13 ENCOUNTER — Encounter: Admit: 2022-06-14 | Payer: PRIVATE HEALTH INSURANCE

## 2022-06-13 DIAGNOSIS — F1020 Alcohol dependence, uncomplicated: Secondary | ICD-10-CM

## 2022-06-13 NOTE — Unmapped (Signed)
IOP Group Note    Date: 06/13/2022    Start Time: 6:00 PM    End Time: 9:00 PM    Duration (min): 180 Minutes    Clinician: Marquist Binstock D [place]    Program: IOP Outpatient Adult    Staff to Patient Ratio:  1: 9    Group Notes: Session 38 of Living and Balance reviewed ideas about ways in which alcohol or other drug addiction can mask and mimic mental health disorders, as well as contribute to the severity of the problems.In addition mental health disorders can also affect and intensify substance use disorders.    Individual Note: Emotional/Attitudinal check in where each patient shared their drug of choice, last use recovery involvement, triggers, cravings, and therapist/medication.  Patient reports that she could relate to this evenings session, having experienced symptoms of depression and anxiety in use and withdrawal.    This was a video visit, including two-way audio and video communication, in lieu of an in-person visit. The patient provided verbal consent to use the video visit.    I spent 180M on this call conducting an interview, performing a limited exam by video, with >50% spent educating the patient on my assessment and plan.    Hour 1: Check In    Hour 2: Living and Balance Session 38 Effects of substance use on Mental Health    Hour 3: Living and Balance Session 38 Effects of substance use on Mental Health

## 2022-06-14 ENCOUNTER — Encounter: Admit: 2022-06-15 | Payer: PRIVATE HEALTH INSURANCE

## 2022-06-14 DIAGNOSIS — F1020 Alcohol dependence, uncomplicated: Secondary | ICD-10-CM

## 2022-06-14 NOTE — Unmapped (Signed)
IOP Group Note    Date: 06/14/2022    Start Time: 6:00 PM    End Time: 9:00 PM    Duration (min): 180 Minutes    Clinician: Jomarion Mish D [place]    Program: IOP Outpatient Adult    Staff to Patient Ratio:  1: 10    Group Notes:  Session 39 Living and Balance Co-occurring Disorders concentraetd on understanding terms and definitions of  Co-occurring disorders, important concepts about Co-occurring Disorders and common Mental Health Disorders    Individual Note: Emotional/Attitudinal check in where each patient shared their drug of choice, last use recovery involvement, triggers, cravings, and therapist/medication.  Patient reports that she is gaining a better understanding of her alcohol use disorder and treatment necessities for her recovery to be successful.    This was a video visit, including two-way audio and video communication, in lieu of an in-person visit. The patient provided verbal consent to use the video visit.    I spent 180M on this call conducting an interview, performing a limited exam by video, with >50% spent educating the patient on my assessment and plan.    Hour 1: Check In    Hour 2: Living and Balance Session 39 Co-occurring Disorders    Hour 3: Living and Balance Session 39 Co-occurring Disorders

## 2022-06-18 ENCOUNTER — Encounter: Admit: 2022-06-19 | Payer: PRIVATE HEALTH INSURANCE | Attending: Addiction (Substance Use Disorder)

## 2022-06-18 DIAGNOSIS — F1020 Alcohol dependence, uncomplicated: Secondary | ICD-10-CM

## 2022-06-18 NOTE — Unmapped (Signed)
IOP Group Note    Date: 06/18/2022    Start Time: 6:00 PM    End Time: 9:00 PM    Duration (min): 180 Minutes    Clinician: Petra Kuba, LISW-S    Program: IOP Outpatient Adult    Staff to Patient Ratio:  1:10    This was a video visit, including two-way audio and video communication, in lieu of an in-person visit. The patient provided verbal consent to use the video visit.    I spent 180M on this call conducting an interview, performing a limited exam by video, with >50% spent educating the patient on my assessment and plan.     Group Notes:This session reviews important terms, definitions, and concepts about co-occurring disorders and discusses mental health disorders common among people with co- occurring disorders. This session has three major parts: (1) Terms and Definitions, (2) Important Concepts about Co-occurring Disorders, and  (3) Common Mental Health Disorders.  After participating in part 1, you will be able to   describe the major features of a substance use disorder,  or addiction.   describe the major features of mental health disorders.  After participating in part 2, you will be able to   describe important concepts about co-occurring disorders.   understand that co-occurring disorders are independent brain diseases that vary in intensity.   understand that co-occurring disorders are treatable and need targeted treatment.    Individual Note:Emotional/attitudinal check in where each patient shared their drug of choice, last use, recovery involvement, triggers, cravings and therapist/medication.   Sherry Compton reports  that for her she has some of the symptoms but knows for her other than the depression and anxiety it's PAWS.      Hour 1: check in.     Hour 2: packet and processing    Hour 3: packet and processing

## 2022-06-20 ENCOUNTER — Other Ambulatory Visit: Admit: 2022-06-20 | Payer: PRIVATE HEALTH INSURANCE

## 2022-06-20 ENCOUNTER — Institutional Professional Consult (permissible substitution): Admit: 2022-06-20 | Discharge: 2022-06-20 | Payer: PRIVATE HEALTH INSURANCE | Attending: Family

## 2022-06-20 ENCOUNTER — Encounter: Admit: 2022-06-21 | Payer: PRIVATE HEALTH INSURANCE | Attending: Addiction (Substance Use Disorder)

## 2022-06-20 DIAGNOSIS — Z113 Encounter for screening for infections with a predominantly sexual mode of transmission: Secondary | ICD-10-CM

## 2022-06-20 DIAGNOSIS — F1020 Alcohol dependence, uncomplicated: Secondary | ICD-10-CM

## 2022-06-20 LAB — CHLAMYDIA / GONORRHOEAE DNA SWAB
Chlamydia Trachomatis DNA Swab: POSITIVE — AB
Neisseria gonorrhoeae DNA Swab: NEGATIVE

## 2022-06-20 LAB — BACTERIAL VAGINOSIS PANEL
Candida Species: POSITIVE
Gardnerella vaginosis: NEGATIVE
Trichomonas vaginosis: NEGATIVE

## 2022-06-20 NOTE — Unmapped (Signed)
Nurse Visit- Self Swab    Reason for Visit  Sherry Compton, verified with two identifiers, presents for self collected vaginal swab.   Patient complains of symptoms of creamy white discharge onset 10 days   Date of last provider visit: 05/16/22      Specimen collection  Patient provided with swab to collect for: BV Panel, GC/CH. Patient given instructions for vaginal swab self-collection:  Wash hands and remove collection tube and swab from kit  Use one hand to separate the labia to access the vaginal opening.   Use the other hand to insert the swab about 2 inches into the vaginal opening.  Gently turn the swab for about 30 seconds rubbing the swab against the wall of the vagina.  Remove the swab carefully and place it in the collection tube.     Discharge Instructions  Provider placed orders for lab processing of samples. Provider will review results upon completion of lab processing  Staff will follow up with treatment options, care plan.   Annual is due    Lynn Ito, [place]  [facility]  Vernona Rieger Power CNP, rendering provider, reviewed and agrees with this care plan.

## 2022-06-20 NOTE — Unmapped (Signed)
I have reviewed the patients chart and agree with the care plan.

## 2022-06-20 NOTE — Unmapped (Signed)
IOP Group Note    Date: 06/20/2022    Start Time: 6:00 PM    End Time: 9:00 PM    Duration (min): 180 Minutes    Clinician: Petra KubaWilliam Jason Dianna Deshler, LISW-S    Program: IOP Outpatient Adult    Staff to Patient Ratio:  1:9    This was a video visit, including two-way audio and video communication, in lieu of an in-person visit. The patient provided verbal consent to use the video visit.    I spent 180M on this call conducting an interview, performing a limited exam by video, with >50% spent educating the patient on my assessment and plan.     Group Notes: When Love is not Enough, the [employer] Story.  This film was a story about Devotion, Recovery and Hope.  It also showed the start of the mutual help support groups Alcoholics Anonymous and [facility].    Individual Note: Holley processed her concern about her son not getting to come back home.  She processed the court proceeding and the outcome and her discouragement with it but despite this isn't going to allow it to make her drink.     Hour 1: check in.     Hour 2: Film & processing also orientated a new patient to the group.    Hour 3: Film & processing also orientated a new patient to the group.

## 2022-06-21 ENCOUNTER — Encounter: Admit: 2022-06-22 | Payer: PRIVATE HEALTH INSURANCE | Attending: Addiction (Substance Use Disorder)

## 2022-06-21 DIAGNOSIS — F1021 Alcohol dependence, in remission: Secondary | ICD-10-CM

## 2022-06-21 MED ORDER — doxycycline (MONODOX) 100 MG capsule
100 | ORAL_CAPSULE | Freq: Two times a day (BID) | ORAL | 0 refills
Start: 2022-06-21 — End: 2022-07-09

## 2022-06-21 MED ORDER — fluconazole (DIFLUCAN) 150 MG tablet
150 | ORAL_TABLET | Freq: Once | ORAL | 0 refills | Status: AC
Start: 2022-06-21 — End: 2022-06-21

## 2022-06-21 NOTE — Unmapped (Addendum)
IOP Group Note    Date: 06/21/2022    Start Time: 6:00 PM    End Time: 9:00 PM    Duration (min): 180 Minutes    Clinician: Petra Kuba, LISW-S    Program: IOP Outpatient Adult    Staff to Patient Ratio:  1:8    This was a video visit, including two-way audio and video communication, in lieu of an in-person visit. The patient provided verbal consent to use the video visit.    I spent 180M on this call conducting an interview, performing a limited exam by video, with >50% spent educating the patient on my assessment and plan.     Group Notes: Phases of Dual Recovery session of the Living in Balance program. This session reviews the ways in which recovery for people with co-occurring disorders is a process with several different phases and the ways in which recovery affects your body, mind, relationships, and spirit.  You will be asked to explore various ways in which your recovery is a process that involves your body, mind, spirit, and social world. You will be asked to think about ways in which your co-occurring disorders have affected these areas of your life. You will be asked to consider that your recovery will likewise affect these areas of your life. These are deeply personal and emotional issues. Going through this session may be challenging and will take courage. However, working through this session can provide you with important information about your recovery and bring about improvement in multiple areas of your life. This information can help you to develop a stronger recovery and to live in balance.    Individual Note:Emotional/attitudinal check in where each patient shared their drug of choice, last use, recovery involvement, triggers, cravings and therapist/medication.   Margretta reports  that tonight his her last night in group.  She shared her lead and received supportive feedback.  She is attending aftercare and detailed the progression of her use.  Oneisha has successfully completed the  IOP.    Hour 1: check in.     Hour 2: Session 41, Phases of Dual Recovery up to part 2.     Hour 3: Session 41, Phases of Dual Recovery up to part 2.

## 2022-06-21 NOTE — Unmapped (Signed)
[place]  [facility]  Department of Psychiatry    IOP Discharge Summary      Sherry Compton  [id number]    Admission date:   03/28/2022    Discharge:   Discharge Date: 06/21/2022  Discharge Time: 9PM  Discharge Day of Week: Thursday  Length of Stay:  36  Type of Discharge: Successful completion   Discharge Practitioner: Mearl Latin, LISW-S, LICDC-CS  Discharge Remarks/Comments: Sherry Compton has successfully completed the IOP.      Reason for Admission: Due to her inability to stop or control her drinking.  She was in IOP and then relapsed and then went to residential treatment for 120 days.  She then came back to IOP to follow treatment recommendations.      Hospital Course:   Patient is a 56 y.o. female who presents with Alcohol use Disorder severe.  Patient was admitted on a involuntarily basis.  Patient was seen and evaluated by a multidisciplinary treatment team, in this evaluation patient was able to contribute freely. Patient was able to provide informed consent and outline the risks and benefits of medications, benefits of treatment , including but not limited to pros and cons of mutual support groups.      Primary complaints include: alcohol abuse.  Onset of symptoms was gradual starting several years ago with gradually worsening course since that time.  Progressed to a severe AUD.      Acute Intoxication and Withdrawal Potential: no    Biomedical Conditions and Complications: no    Readiness to change:  yes    Relapse/Continued use/Continued Problem Potential:  yes     Recovery Environment:  yes    Family or caregiver functioning (under age 69):  not applicable    Summary of Services provided: IOP assessment, treatment plan (2), 36 IOP group sessions.     Course of Treatment/Client's Response to Treatment: Sherry Compton has responded well to treatment and then private paid when he insurance refused additional services.  Sherry Compton has shown a commitment to her recovery and family.     If Discharge was Involuntary,  was client informed of right to appeal: not applicable    Medications Prescribed by an Outside Physician (if known): see below (She's on Naltrexone and Welbutrin)     List Discharge Medications:     Current Outpatient Medications   Medication Sig    buPROPion XL Take 1 tablet (150 mg total) by mouth daily.    doxycycline Take 1 capsule (100 mg total) by mouth 2 times a day.    estradioL Place 10 mcg vaginally every Monday and Thursday.    fluconazole Take 1 tablet (150 mg total) by mouth once for 1 dose.    naloxone Apply 1 spray in one nostril if needed. Call 911. May repeat dose in other nostril if no response in 3 minutes.    naltrexone Take 1 tablet (50 mg total) by mouth daily.    nitrofurantoin (macrocrystal-monohydrate) Take one capsule PO after intercourse. Max dose is one capsule per day. Indications: UTI prophylaxis (post-coital use)    valACYclovir Take two tablets PO BID at start of cold sore symptoms for 1 day     No current facility-administered medications for this visit.       Client interested in referral for further services (if so, please state reason): Yes aftercare services and possibly individual services.        Complications: Drinking despite intent not to.      Consults: None  Past Psychiatric History:   Drug/alcohol  Currently in treatment with Dr. Jonita Albee in [place] .    Substance Abuse History:  Alcohol   Last Use: 11/14/2021  Use of Alcohol: 4 out of 4 on CAGE  Use of Caffeine:  two pots of coffee per day  Use of OTC: none reported.     Past Medical History:   Diagnosis Date    Abnormal Pap smear of cervix     Alcoholism (CMS-HCC)     Anxiety     STD (sexually transmitted disease)     trch, GC, HSV    Wears glasses     readers      Past Surgical History:   Procedure Laterality Date    AUGMENTATION BREAST ENDOSCOPIC      CYSTOSCOPY N/A 11/08/2020    Procedure: CYSTOSCOPY;  Surgeon: Lamona Curl, MD;  Location: [facility] OR;  Service: Gynecology;  Laterality: N/A;    ENDOMETRIAL ABLATION       gastric sleeve bariatric surgeyr by dr Clementeen Graham      REDUCTION BREAST  01/18/2021    SUSPENSION BLADDER NECK TRANS-VAGINAL SLING N/A 11/08/2020    Procedure: PERINEORRHAPHY, TRANS-VAGINAL TAPE;;  Surgeon: Lamona Curl, MD;  Location: [facility] OR;  Service: Gynecology;  Laterality: N/A;    tummy tuck  04/16/2021    VULVA SURGERY N/A 11/08/2020    Procedure: LABIAPLASTY;  Surgeon: Lamona Curl, MD;  Location: [facility] OR;  Service: Gynecology;  Laterality: N/A;      Social History     Tobacco Use    Smoking status: Every Day     Packs/day: 0.50     Types: Cigarettes     Last attempt to quit: 11/07/1992     Years since quitting: 29.6    Smokeless tobacco: Never   Substance Use Topics    Alcohol use: No      Family History   Problem Relation Age of Onset    Cancer Mother         Cervical    Stroke Mother 71        05/13/22    Heart disease Mother         mi at age high 80    Cancer Sister 41        Ovarian Cancer    Migraines Sister     Diabetes Paternal Grandmother     Heart disease Paternal Grandmother     Miscarriages / Stillbirths Paternal Grandmother         Education: some college  Other Pertinent History: Arrests, Surveyor, quantity, Armed forces operational officer, Trauma, and Violence    (Not in a hospital admission)      Is the patient prescribed multiple antipsychotics? No      No Known Allergies     Medical Review Of Systems:  Pertinent items are noted in HPI.    Psychiatric Review Of Systems:  Sleep: fair  Interest/anhedonia: increased  Appetite Changes: increased  Weight Changes: No change  Energy: increased  Libido: increased  Anxiety/Panic:  varying  Guilt: varying   Hopeless: absent  S.I.B.s/risky behavior:  absent    GAF: 70    Objective:  Last Vital Signs:  Wt Readings from Last 3 Encounters:   No data found for Wt     Temp Readings from Last 3 Encounters:   No data found for Temp     BP Readings from Last 3 Encounters:   No data found for BP     Pulse Readings from Last  3 Encounters:   No data found for Pulse       No exam performed today, beyond  scope of this clinician.    Mental Status Evaluation:  Appearance:  age appropriate   Behavior:  normal   Speech:  articulation error   Mood:  normal   Affect:  normal   Thought Process:  normal   Thought Content:  normal   Sensorium:  person, place, time/date, and situation   Memory: intact   Cognition:  grossly intact   Insight:  fair   Judgment:  limited       [place] Suicide Severity Scale DC Screener  While you were in the hospital, have you wished you were dead or wished you could go to sleep and not wake up? NO  While you were in the hospital have you actually had thoughts about killing yourself? NO  If yes, it asks:  Have you been thinking about how you might kill yourself  Have you had these thoughts and had some intention of acting on them or do you have intention of acting on them after you leave the hospital?  Have you started to work out or worked out the details of how to kill yourself either for while you were here in the hospital or after you leave the hospital?  Do you intend to carry out this plan?  While in the hospital have you done anything, started to do anything or prepared to do anything to end your life? NO      Based on Sherry Compton's responses to the [place] Suicide risk assessment Sherry Compton is low risk for relapse.      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  Obtain from [employer].

## 2022-06-21 NOTE — Unmapped (Signed)
Addended by: Adonis Brook on: 06/21/2022 02:00 PM     Modules accepted: Orders

## 2022-06-21 NOTE — Unmapped (Signed)
Pt calling to request treatment for + chlamydia & + yeast results she received today.   Pharmacy verified.  Reviewed with pt that chlamydia is an STI so all partners will need treated as well.   Advised that all parties should abstain from intercourse for at least 7 days, preferably 14 days, after completion of treatment.   Pt verbalized understanding.

## 2022-06-21 NOTE — Unmapped (Signed)
Pt calling re: test results  Informed BV panel + for yeast, GC/CT still in process  Pt verbalized understanding will await other result for tx

## 2022-06-27 MED ORDER — fluconazole (DIFLUCAN) 150 MG tablet
150 | ORAL_TABLET | Freq: Once | ORAL | 0 refills | Status: AC
Start: 2022-06-27 — End: 2022-06-27

## 2022-06-27 NOTE — Unmapped (Signed)
Requested Prescriptions     Pending Prescriptions Disp Refills    doxycycline (MONODOX) 100 MG capsule 14 capsule 0     Sig: Take 1 capsule (100 mg total) by mouth 2 times a day.     Pt asking for refill. Pt still has sx of discharge and itching. +chlamydia and + yeast on 06/20/22.

## 2022-07-03 ENCOUNTER — Ambulatory Visit: Payer: PRIVATE HEALTH INSURANCE

## 2022-07-05 ENCOUNTER — Ambulatory Visit: Admit: 2022-07-05 | Payer: PRIVATE HEALTH INSURANCE

## 2022-07-05 ENCOUNTER — Other Ambulatory Visit: Admit: 2022-07-06 | Payer: PRIVATE HEALTH INSURANCE

## 2022-07-05 ENCOUNTER — Ambulatory Visit
Admit: 2022-07-05 | Discharge: 2022-07-09 | Payer: PRIVATE HEALTH INSURANCE | Attending: Student in an Organized Health Care Education/Training Program

## 2022-07-05 DIAGNOSIS — A64 Unspecified sexually transmitted disease: Secondary | ICD-10-CM

## 2022-07-05 LAB — TREPONEMA PALLIDUM AB WITH REFLEX: Treponema Pallidum: NEGATIVE

## 2022-07-05 LAB — HIV 1+2 ANTIBODY/ANTIGEN WITH REFLEX: HIV 1+2 AB/AGN: NONREACTIVE

## 2022-07-05 LAB — CHLAMYDIA / GONORRHOEAE DNA URINE
Chlamydia Trachomatis DNA Urine: NEGATIVE
Neisseria gonorrhoeae DNA Urine: NEGATIVE

## 2022-07-05 LAB — BACTERIAL VAGINOSIS PANEL
Candida Species: NEGATIVE
Gardnerella vaginosis: POSITIVE — AB
Trichomonas vaginosis: POSITIVE — AB

## 2022-07-05 LAB — HEPATITIS B SURFACE ANTIGEN: Hep B Surface Ag: NONREACTIVE

## 2022-07-05 LAB — HEPATITIS C ANTIBODY: HCV Ab: NONREACTIVE

## 2022-07-05 MED ORDER — hydrocortisone 1 % cream
1 | TOPICAL | 0 refills | 15.00000 days | Status: AC
Start: 2022-07-05 — End: ?

## 2022-07-05 NOTE — Unmapped (Signed)
Phoned patient to inform we had sooner available time/date for surgical procedure with Dr. Zachery Dauer. Patient confirmed availability for 7/25 at [facility] for 2:15PM start time 12:15PM arrival.

## 2022-07-05 NOTE — Unmapped (Signed)
GYN PROBLEM VISIT    CC: Pt is a 56 y.o. [id number] who presents for vaginitis.     Patient was found to have yeast and chlamydia at end of June, received tx for both including repeat dose of diflucan at end of doxy course. She states still having excessive discharge and diffuse itching. She does want to confirm if any concerns for herpes.    Also desires additional STI testing.      Past Medical History:   Diagnosis Date    Abnormal Pap smear of cervix     Alcoholism (CMS-HCC)     Anxiety     STD (sexually transmitted disease)     trch, GC, HSV    Wears glasses     readers       Past Surgical History:   Procedure Laterality Date    AUGMENTATION BREAST ENDOSCOPIC      CYSTOSCOPY N/A 11/08/2020    Procedure: CYSTOSCOPY;  Surgeon: Lamona Curl, MD;  Location: [facility] OR;  Service: Gynecology;  Laterality: N/A;    ENDOMETRIAL ABLATION      gastric sleeve bariatric surgeyr by dr Clementeen Graham      REDUCTION BREAST  01/18/2021    SUSPENSION BLADDER NECK TRANS-VAGINAL SLING N/A 11/08/2020    Procedure: PERINEORRHAPHY, TRANS-VAGINAL TAPE;;  Surgeon: Lamona Curl, MD;  Location: [facility] OR;  Service: Gynecology;  Laterality: N/A;    tummy tuck  04/16/2021    VULVA SURGERY N/A 11/08/2020    Procedure: LABIAPLASTY;  Surgeon: Lamona Curl, MD;  Location: [facility] OR;  Service: Gynecology;  Laterality: N/A;       Social History     Substance and Sexual Activity   Sexual Activity Yes    Partners: Male       Current Outpatient Medications   Medication Sig    buPROPion XL Take 1 tablet (150 mg total) by mouth daily.    doxycycline Take 1 capsule (100 mg total) by mouth 2 times a day.    estradioL Place 10 mcg vaginally every Monday and Thursday.    hydrocortisone Apply to vulvar area of itching as needed. Once itching no longer significant, stop use. Do not exceed two weeks maximum unless instructed to do so by your physician.    naloxone Apply 1 spray in one nostril if needed. Call 911. May repeat dose in other nostril if no response in 3 minutes.    naltrexone  Take 1 tablet (50 mg total) by mouth daily.    nitrofurantoin (macrocrystal-monohydrate) Take one capsule PO after intercourse. Max dose is one capsule per day. Indications: UTI prophylaxis (post-coital use)    valACYclovir Take two tablets PO BID at start of cold sore symptoms for 1 day     No current facility-administered medications for this visit.       Review of Systems - per HPI    Physical Exam:  BP 110/62   Pulse 75   Ht 5' 9.5 (1.765 m)   Wt 174 lb 6.4 oz (79.1 kg)   SpO2 99%   BMI 25.39 kg/m     OBJECTIVE   Gen: NAD, no acute distress  Neuro/Psych: no gross neurological deficits, appropriate affect and mood  Lungs: normal work of breathing  Extremities: normal range of motion, grossly intact strength in extremities    Pelvic: Normal appearing external genitalia, vagina, cervix.  No palpable enlarged uterus or adnexal masses  No palpable inguinal lymphadenopathy  Discharge: moderate thin white discharge, concerning for either residual yeast or BV  Diffuse excoriations from scratching, no evidence of herpetic lesions.  Exam conducted with chaperone present.       A/P: Pt is a 56 y.o. [id number] who presents for vaginitis  1. Reviewed with pt symptoms are more consistent with vaginitis, no evidence of herpes. Discussed typical herpes symptoms to monitor for  2. Vaginitis panel recollected, will await results to see if yeast and/or BV and treat as appropriate. Anticipate results within 24h. Will given low dose steroid cream in meantime for itching relief. If persistent yeast, consider terconazole vaginal cream instead given lack of response to diflucan. Pt agreeable to plan.   3. STI blood panel ordered. Pt cautioned regarding false neg window in early exposure, recommend 2 month repeat if all negative testing initially. Too soon for GCCT retest but orders placed for urine TOC. Pt instructed to repeat in 3 month as well once treated. Pt affirms partner was notified to obtain treatment himself as well.      RTO as needed    Orders Placed This Encounter    Vaginitis Panel DNA Amplified Probe    HIV 1+2 Antibody/Antigen with Reflex    Hepatitis B surface antigen    Hepatitis C Antibody    Syphilis Screening Daune Perch)    Chlamydia / Gonorrhoeae DNA Urine    HIV 1+2 Antibody/Antigen with Reflex    Hepatitis B surface antigen    Hepatitis C Antibody    Syphilis Screening Daune Perch)    hydrocortisone 1 % cream

## 2022-07-05 NOTE — Unmapped (Signed)
Pt calling to follow up on OV today   Informed hydrocortisone cream prescribed this morning, test results still in process  Pt verbalized   Updated [facility] location for future fills per request

## 2022-07-06 MED ORDER — metroNIDAZOLE (FLAGYL) 500 MG tablet
500 | ORAL_TABLET | Freq: Two times a day (BID) | ORAL | 0 refills | Status: AC
Start: 2022-07-06 — End: 2022-07-13

## 2022-07-06 NOTE — Unmapped (Addendum)
Pt calling re: +BV and Trichomonas  Requesting Rx for tx   Informed GC/CT is still pending   Pt verbalized understanding   States that she is experiencing significant vaginal itching, asking if a different cream can be sent in addition to the hydrocortisone, states it does not help

## 2022-07-06 NOTE — Unmapped (Signed)
Called pt regarding trich and BV. Flagyl Rx sent. Pt sober, cautioned against any alcohol use during this time. Condoms and safe sex discussed.     OF note, pt obtained urine GCCT yesterday. This is earlier than recommended. Chance of false pos due to inadequate time after treatment. If positive, should retest at 3-4 week mark to confirm if true persistence.     Michaelle Copas, MD

## 2022-07-09 NOTE — Unmapped (Addendum)
[facility] for Perioperative Care    We're pleased that you have chosen [facility] for your upcoming procedure.  The staff serving you is professionally trained to provide the highest quality care.  We encourage you to ask questions and to let the staff know your special needs.  We want your visit to be as comfortable as possible. Below you will find instructions for your upcoming procedure. Please read the instructions and follow closely, failure to follow instructions may lead to delay or cancellation of your surgery.     Your procedure is scheduled on July 25.    Please arrive at 12 pm .    Location: Come in the main entrance of the hospital and the registration desk will be on your right as soon as you enter the lobby.      [facility]        [address]  [place], [place] [address]    Surgery area phone number:   [phone]       OTHER USEFUL INFORMATION [facility])     If you have any questions about these instructions please call us at:    [facility] for Perioperative Care  Monday - Friday 8:00 am - 4:30 pm  [phone]    For questions regarding surgery, please call your surgeon.     GENERAL INFORMATION BEFORE SURGERY     ?STOP shaving in the area of the surgery for the 3 days prior.   If needed, a trained staff member will clip the area immediately before your surgery.    Contact your surgeon if you develop any of the following before surgery:  A fever or symptoms of an Upper Respiratory Infection.  A skin rash/wound/lesion.  A cold or are sick ANYTIME.    If you smoke, quit smoking as far in advance of surgery as possible.   Patients who quit at least 30 days before surgery may have better outcomes.          MEDICATIONS     ? OK TO TAKE     ACETAMINOPHEN (TYLENOL): This is the only over the counter pain medication that is ok to take before surgery.   Vitamins to treat a specific deficiency: ex. Vitamins B, C and D, Calcium, Folate, Iron,  Magnesium and Potassium.    ? ONE WEEK PRIOR TO YOUR PROCEDURE STOP/ DO NOT TAKE          (the following are held due to increased risk of bleeding).    MULTIVITAMINS  SUPPLEMENTS/HERBAL SUPPLEMENTS: Glucosamine-Chondroitin, Garlic, Ginseng, Ginko Biloba, Vitamin E, Echinacea, Turmeric, Saw Palmetto, St. John's Wort, Herbal tea  CANNABIS PRODUCTS: ex. CBD oil, Gummies or edibles, THC   ASPIRIN  NSAIDS (non-steroidal anti-inflammatories such as Ibuprofen, Advil, Aleve, Naproxen,                       Excedrin, Meloxicam/Mobic, Celoxib/Celebrex, oral Diclofenac/Voltaren)      BLOOD THINNERS     Aspirin: Continue to take if you have history of stroke, cardiac stents, peripheral vascular disease unless specifically directed by your provider otherwise.         DIABETIC PATIENTS     ?Pay close attention to your blood sugar. Try to keep it in the range your doctor recommends      STOP the following medications 3 days before surgery and day of surgery:   ? Empagliflozin (Jardiance), Empagliflozin+Metformin (Jardiomet), Dapagliflozin Marcelline Deist), Canagliflozin (  Invokana), Dapagliflozin+Metformin (Xigudo).          CONTINUE ALL OTHER PRESCRIBED HOME MEDICATIONS   UNLESS OTHERWISE INSTRUCTED.      MORNING OF THE PROCEDURE  EATING AND DRINKING INSTRUCTIONS       IF your physician has prescribed a special diet or bowel prep prior to your surgery or procedure, please follow the instructions from your physician's office.     IF your arrival time changes, the times below will need to be adjusted accordingly.     Nothing to eat or drink after midnight except:                    2 HOURS     Prior to hospital arrival.      Complete clear fluid (up to 16 oz) by - if you choose to do this you must complete all liquids by 10 am.  A clear fluid may have color, but you need to be able to see through it (examples: water, clear broth, Gatorade, apple juice). Coffee and tea should NOT contain milk, creamer or sugar.  Juice should NOT contain  pulp. No gum or mints beyond 2 hours.        MORNING OF SURGERY    DO NOT EAT OR DRINK ANYTHING (including gum, mints, water, etc) after midnight.    ?TAKE ONLY these medications the morning of surgery with a small sip of water     Wellbutrin  Naltrexone(  I will call you if you need to stop this before your surgery).     ADDENDUM 07/09/22 at 425 pm.  STOP Naltrexone(Depade) 2 days prior to surgery. Last dose will be 07/14/22.     PLEASE BRUSH YOUR TEETH    SHOWER: BOTH the night before and the morning of surgery with an antibacterial soap, such as Dial.    ?Remove all makeup, nail polish, jewelry, wristwatch, body piercings.  ?Do not use powder, lotions, deodorant, and perfume/cologne.     WHAT TO WEAR: Wear casual, loose fitting, and comfortable clothing.    Bring with you:  ? List of your medications, dose and how often you take the medication.  ? List of over-the-counter medications and supplements, dose and how often you take the       medication.    ? Photo ID  ? Insurance card     ? Glasses and case  ? Dentures and case   ? Hearing aids and case  ? Crutches, walker, cane (if you have the items and will need them after surgery)      ? Rescue Inhaler (albuterol)  ? CPAP machine and all associated equipment except water  ? Nerve Stimulator/ Remote  ? Other:     ? For OVERNIGHT stay - pack a small bag of items that you may need                                             (ex. change of clothing, toiletries, phone charger)             - Locker space is limited in the surgery area.    Leave at home:    We recommend that you leave valuables (ex. money, jewelry, credit cards) at home or     with your family.   ? Do not bring any medications to the hospital. (Exception:  rescue inhaler, transplant medications, Parkinson's or seizure medication.)  ? CONTACT LENSES- leave at home or bring a case for safe keeping.      TRANSPORTATION and VISITORS     Please make transportation arrangements and bring ONE responsible adult to  accompany you home and remain with you for 24 hours after having received anesthesia.              You will NOT be permitted to drive.  You may take a taxi, uber, etc., only if accompanied by a familiar adult.    VISITING HOURS: Same Day Surgery/Pre-Anesthesia: Open to two visitors per patient, from 5:30 a.m. to 7 p.m.      ICU and Stepdown units: Open to two visitors per patient at a time, from 2 p.m. to 7 p.m.    Inpatient units: Open to any number of visitors 24/7.        WHAT TO EXPECT     Make sure all of your health care givers are checking your ID bracelet and verifying your name and date of birth.  You will actively be involved in verifying the type of surgery you are having and the correct site.      Your health care givers should be cleaning their hands with soap and water or antibacterial foam before taking care of you and if they do not, it is ok to remind them to do so.  Antibacterial showering and good hand hygiene are essential to prevent surgical site infections and reduce the spread of MRSA.     In an effort to reduce the risks of blood clots after your surgery you may have compression sleeves on your lower legs.  These sleeves help facilitate circulation and decrease the chances of developing any blood clots.     You may be given an incentive spirometer after surgery to use every hour to help prevent pneumonia by having you take deep breaths in and out.  You will be given instructions about proper use after surgery.

## 2022-07-09 NOTE — Unmapped (Signed)
I spoke with Sherry Reining NP about this pts Depade(Naltrexone) which she takes to control her Alcoholism.      Should she stop it before surgery?      Yes, 2 days before , if Sherry Compton is agreeable.      I called Sherry Compton back. Informed her of above. She states she is fine holding it for 2 days. Reviewed that her last dose will be Saturday 07/14/22.      Sherry Compton agrees and has no questions for me at this time.

## 2022-07-11 NOTE — Unmapped (Signed)
Patient called in to return my phone call in regards to scheduling post op appointment. Patient confirmed availability for [facility]11 [facility] for their 2 week post op with Dr. Zachery DauerBarnes.

## 2022-07-13 ENCOUNTER — Encounter: Admit: 2022-07-13 | Discharge: 2022-07-13 | Payer: PRIVATE HEALTH INSURANCE | Attending: Sports Medicine

## 2022-07-13 DIAGNOSIS — S42021A Displaced fracture of shaft of right clavicle, initial encounter for closed fracture: Secondary | ICD-10-CM

## 2022-07-13 NOTE — Unmapped (Signed)
Phoned patient to confirm labiaplasty with Dr. Zachery Dauer for 7/25 at [facility] with 12PM arrival at [facility]. No answer left voicemail to call back into the office to confirm.

## 2022-07-13 NOTE — Unmapped (Signed)
Wrong appointment.  Should have been w/ PCP for pre-op physical

## 2022-07-16 NOTE — Unmapped (Signed)
Patient phoned in to cancel surgical case with Dr. Zachery Dauer for 7/25 due to patient is working with another physician in regards to her issues and did not wish to have an operation. Patient states she is not looking to reschedule at this time.

## 2022-07-25 ENCOUNTER — Ambulatory Visit: Admit: 2022-07-25 | Payer: PRIVATE HEALTH INSURANCE

## 2022-07-25 DIAGNOSIS — Z01818 Encounter for other preprocedural examination: Secondary | ICD-10-CM

## 2022-07-25 DIAGNOSIS — A64 Unspecified sexually transmitted disease: Secondary | ICD-10-CM

## 2022-07-25 LAB — CHLAMYDIA / GONORRHOEAE DNA URINE
Chlamydia Trachomatis DNA Urine: NEGATIVE
Neisseria gonorrhoeae DNA Urine: NEGATIVE

## 2022-07-25 LAB — HEPATITIS C ANTIBODY: HCV Ab: NONREACTIVE

## 2022-07-25 LAB — HEPATITIS B SURFACE ANTIGEN: Hep B Surface Ag: NONREACTIVE

## 2022-07-25 LAB — TREPONEMA PALLIDUM AB WITH REFLEX: Treponema Pallidum: NEGATIVE

## 2022-07-25 LAB — HIV 1+2 ANTIBODY/ANTIGEN WITH REFLEX: HIV 1+2 AB/AGN: NONREACTIVE

## 2022-07-25 MED ORDER — cyanocobalamin (VITAMIN B-12) 1000 MCG tablet
1000 | ORAL_TABLET | Freq: Every day | ORAL | 3 refills | Status: AC
Start: 2022-07-25 — End: ?

## 2022-07-25 MED ORDER — valACYclovir (VALTREX) 1000 MG tablet
1000 | ORAL_TABLET | ORAL | 3 refills | Status: AC
Start: 2022-07-25 — End: ?

## 2022-07-25 MED ORDER — nitrofurantoin, macrocrystal-monohydrate, (MACROBID) 100 MG capsule
100 | ORAL_CAPSULE | ORAL | 2 refills | Status: AC
Start: 2022-07-25 — End: ?

## 2022-07-25 MED ORDER — buPROPion XL (WELLBUTRIN XL) 150 MG 24 hr tablet
150 | ORAL_TABLET | Freq: Every day | ORAL | 2 refills | Status: AC
Start: 2022-07-25 — End: ?

## 2022-07-25 MED ORDER — naltrexone (DEPADE) 50 mg tablet
50 | ORAL_TABLET | Freq: Every day | ORAL | 3 refills | Status: AC
Start: 2022-07-25 — End: ?

## 2022-07-25 NOTE — Unmapped (Signed)
[facility] Pre-Operative Consultation     Date of Visit: 07/25/2022  Patient name:  Sherry Compton     Planned Surgery Date: 08/22/22  Planned Procedure: Breast augmentation and tummy tuck revision   Surgeon: Dr. Presley Raddle  Indications/Pre-op Diagnosis: cosmetic      HISTORY OF PRESENT ILLNESS:   Sherry Compton is a 56 y.o. yo female who presents for pre-operative consultation at the request of the surgeon listed above. Patient has no complaints today.    Anesthesia/sedation: general anesthesia      REVIEW OF SYMPTOMS/FUNCTIONAL STATUS:  Chest pain: denies  Shortness of breath: denies  Sleep apnea: denies  Bleeding disorder (h/o nose bleeds, excessive bleeding following a surgical procedure?): denies  Personal or Family history of blood clots?: denies  Personal or Family history of anesthesia complications?: denies  Dentition: reports no issues  Skin infections: reports no issues      PMH  Past Medical History:   Diagnosis Date    Abnormal Pap smear of cervix     Alcoholism (CMS-HCC)     Anxiety     STD (sexually transmitted disease)     trch, GC, HSV    Wears glasses     readers       Past Surgical History  Past Surgical History:   Procedure Laterality Date    AUGMENTATION BREAST ENDOSCOPIC      CYSTOSCOPY N/A 11/08/2020    Procedure: CYSTOSCOPY;  Surgeon: Lamona Curl, MD;  Location: [facility] OR;  Service: Gynecology;  Laterality: N/A;    ENDOMETRIAL ABLATION      gastric sleeve bariatric surgeyr by dr Clementeen Graham      REDUCTION BREAST  01/18/2021    SUSPENSION BLADDER NECK TRANS-VAGINAL SLING N/A 11/08/2020    Procedure: PERINEORRHAPHY, TRANS-VAGINAL TAPE;;  Surgeon: Lamona Curl, MD;  Location: [facility] OR;  Service: Gynecology;  Laterality: N/A;    tummy tuck  04/16/2021    VULVA SURGERY N/A 11/08/2020    Procedure: LABIAPLASTY;  Surgeon: Lamona Curl, MD;  Location: [facility] OR;  Service: Gynecology;  Laterality: N/A;       Personal hx of anesthesia complications: denies    Family History  Family History   Problem Relation Age  of Onset    Cancer Mother         Cervical    Stroke Mother 11        05/13/22    Heart disease Mother         mi at age high 86    Cancer Sister 14        Ovarian Cancer    Migraines Sister     Diabetes Paternal Grandmother     Heart disease Paternal Grandmother     Miscarriages / Stillbirths Paternal Grandmother        Family hx of:  Anesthesia complications: denies  Bleeding/clotting disorders: denies    Social History  Social History     Socioeconomic History    Marital status: Married     Spouse name: None    Number of children: None    Years of education: None    Highest education level: None   Occupational History    None   Tobacco Use    Smoking status: Every Day     Packs/day: 0.50     Types: Cigarettes     Last attempt to quit: 11/07/1992     Years since quitting: 29.7    Smokeless tobacco: Never   Vaping Use  Vaping Use: Never used   Substance and Sexual Activity    Alcohol use: No    Drug use: Yes     Types: Medical Marijuana    Sexual activity: Yes     Partners: Male   Other Topics Concern    Caffeine Use Yes    Occupational Exposure No    Exercise Yes     Comment: 2 -3 times a week riding bikes and walking    Seat Belt Yes   Social History Narrative    None     Social Determinants of Health     Financial Resource Strain: Not on file   Food Insecurity: Not on file   Transportation Needs: Not on file   Intimate Partner Violence: Not on file   Housing Stability: Not on file       Tobacco use: yes cigarettes; 1/2 ppd   Alcohol use: recovering alcoholic; on naltrexone currently was told by surgeons office to d/c 2 days prior to surgery; no alcohol use at this time   Recreational drug use: medical marijuana card    Allergies  No Known Allergies    Allergy to latex or betadine?: denies    Medications:  Outpatient Medications Marked as Taking for the 07/25/22 encounter (Office Visit) with Jeral Pinch, DO   Medication Sig Dispense Refill    buPROPion XL (WELLBUTRIN XL) 150 MG 24 hr tablet Take 1 tablet (150  mg total) by mouth daily. 90 tablet 2    naloxone (NARCAN) 4 mg/actuation Spry Apply 1 spray in one nostril if needed. Call 911. May repeat dose in other nostril if no response in 3 minutes. 2 each 1    naltrexone (DEPADE) 50 mg tablet Take 1 tablet (50 mg total) by mouth daily. 90 tablet 3    nitrofurantoin, macrocrystal-monohydrate, (MACROBID) 100 MG capsule Take one capsule PO after intercourse. Max dose is one capsule per day. Indications: UTI prophylaxis (post-coital use) 6 capsule 2    valACYclovir (VALTREX) 1000 MG tablet Take two tablets PO BID at start of cold sore symptoms for 1 day 4 tablet 3    [DISCONTINUED] buPROPion XL (WELLBUTRIN XL) 150 MG 24 hr tablet Take 1 tablet (150 mg total) by mouth daily. 90 tablet 2    [DISCONTINUED] naltrexone (DEPADE) 50 mg tablet Take 1 tablet (50 mg total) by mouth daily. 90 tablet 3    [DISCONTINUED] nitrofurantoin, macrocrystal-monohydrate, (MACROBID) 100 MG capsule Take one capsule PO after intercourse. Max dose is one capsule per day. Indications: UTI prophylaxis (post-coital use) 6 capsule 2    [DISCONTINUED] valACYclovir (VALTREX) 1000 MG tablet Take two tablets PO BID at start of cold sore symptoms for 1 day 4 tablet 3         Objective:  BP 138/86 (BP Location: Right arm, Patient Position: Sitting)   Pulse 74   Temp 98.3 F (36.8 C) (Temporal)   Ht 5' 9 (1.753 m)   Wt 168 lb 14 oz (76.6 kg)   SpO2 97%   BMI 24.94 kg/m   Body mass index is 24.94 kg/m.  Physical Exam  Constitutional:       General: She is not in acute distress.     Appearance: Normal appearance. She is normal weight. She is not ill-appearing.   HENT:      Head: Normocephalic and atraumatic.      Mouth/Throat:      Mouth: Mucous membranes are moist.      Pharynx: Oropharynx is clear.  Eyes:      General: No scleral icterus.     Extraocular Movements: Extraocular movements intact.      Conjunctiva/sclera: Conjunctivae normal.      Pupils: Pupils are equal, round, and reactive to light.    Cardiovascular:      Rate and Rhythm: Normal rate and regular rhythm.      Pulses: Normal pulses.      Heart sounds: Normal heart sounds. No murmur heard.  Pulmonary:      Effort: Pulmonary effort is normal. No respiratory distress.      Breath sounds: Normal breath sounds. No wheezing.   Abdominal:      General: Abdomen is flat. There is no distension.      Palpations: Abdomen is soft.      Tenderness: There is no abdominal tenderness. There is no guarding.   Musculoskeletal:      Right lower leg: No edema.      Left lower leg: No edema.   Skin:     General: Skin is warm and dry.   Neurological:      General: No focal deficit present.      Mental Status: She is alert and oriented to person, place, and time.   Psychiatric:         Mood and Affect: Mood normal.         Behavior: Behavior normal.     Mallampati score:  2                      Assessment & Plan:    Sherry Compton  is a 56 y.o. year old here for pre-operative examination.     2014 Preop Guidelines  Type of surgery:    Not emergent / elective surgery      No active cardiac disease to be managed acutely, including none of the following:   -  STEMI (ACS), angina, CHF exacerbation, EF < 40%, uncontrolled arrhythmias --> if yes, treat heart problem first    Low risk surgery:   ex: cataract surgery, EGD/Colonoscopy, lumpectomy, skin procedure   - if low risk --> proceed to surgery   - if not low risk, continue to perioperative risk    Estimated perioperative risk:    Calculated METS: [facility] Activity Status Index: 8.978  >10 METS, outstanding functional capacity --> proceed to OR  4-10 METS, adequate functional capacity --> proceed to OR  <4 METS  management would change with testing, so ordered stress test  Management would NOT change with testing, so proceed to OR with medical optimization    1. Preoperative clearance  - PCN ECG 12-Lead (Non-MUSE); normal EKG with no evidence of ischemia or abnormalities  -CBC, CMP, and Mammogram orders signed    2. Vitamin  B12 deficiency due to intestinal malabsorption  - cyanocobalamin (VITAMIN B-12) 1000 MCG tablet; Take 1 tablet (1,000 mcg total) by mouth daily.  Dispense: 90 tablet; Refill: 3    3. Herpes labialis  - valACYclovir (VALTREX) 1000 MG tablet; Take two tablets PO BID at start of cold sore symptoms for 1 day  Dispense: 4 tablet; Refill: 3    4. History of recurrent UTIs  - nitrofurantoin, macrocrystal-monohydrate, (MACROBID) 100 MG capsule; Take one capsule PO after intercourse. Max dose is one capsule per day. Indications: UTI prophylaxis (post-coital use)  Dispense: 6 capsule; Refill: 2    5. Current moderate episode of major depressive disorder, unspecified whether recurrent (CMS-HCC)  - buPROPion XL [facility]  XL) 150 MG 24 hr tablet; Take 1 tablet (150 mg total) by mouth daily.  Dispense: 90 tablet; Refill: 2    6. Alcohol abuse  - naltrexone (DEPADE) 50 mg tablet; Take 1 tablet (50 mg total) by mouth daily.  Dispense: 90 tablet; Refill: 3       Medical history increasing risk: tobacco use disorder    Social history increasing risk: smoking    In summary, the patient is a 56 y.o. Female scheduled for breast augmentation and tummy tuck revision on 08/22/22. T  he patient's surgical risk for this procedure is outlined above. The patient understood and is willing to proceed with the surgery. All questions were answered.    Thank you for this consult and if you have any additional questions, please do not hesitate to contact me.    A copy of this evaluation was transmitted the referring physician.         Jeral PinchLindsy Maxen Rowland, DO  Family Medicine  PGY-1

## 2022-07-25 NOTE — Unmapped (Signed)
Preop physical      EKG: NSR ,rate 65, normal axis, no LVH, no signs of ischemia or infarction    A/P:   (Plan based on AHA/ACC guideline:   Proceed with surgery without further testing     Medication changes prior to surgery : naltrexone to be stopped 2 days before to surgery  Medications to be taken the day of surgery: none

## 2022-08-01 ENCOUNTER — Inpatient Hospital Stay: Admit: 2022-08-01 | Discharge: 2022-08-06 | Payer: PRIVATE HEALTH INSURANCE

## 2022-08-01 DIAGNOSIS — Z1231 Encounter for screening mammogram for malignant neoplasm of breast: Secondary | ICD-10-CM

## 2022-08-02 ENCOUNTER — Encounter

## 2022-08-03 ENCOUNTER — Ambulatory Visit: Payer: PRIVATE HEALTH INSURANCE

## 2022-08-07 ENCOUNTER — Ambulatory Visit: Payer: PRIVATE HEALTH INSURANCE

## 2022-08-13 ENCOUNTER — Ambulatory Visit: Payer: PRIVATE HEALTH INSURANCE | Attending: Student in an Organized Health Care Education/Training Program

## 2022-08-21 ENCOUNTER — Other Ambulatory Visit: Admit: 2022-08-21 | Payer: PRIVATE HEALTH INSURANCE

## 2022-08-21 DIAGNOSIS — Z0000 Encounter for general adult medical examination without abnormal findings: Secondary | ICD-10-CM

## 2022-08-21 LAB — COMPREHENSIVE METABOLIC PANEL
ALT: 8 U/L (ref 7–52)
AST: 12 U/L (ref 13–39)
Albumin: 4.1 g/dL (ref 3.5–5.7)
Alkaline Phosphatase: 53 U/L (ref 36–125)
Anion Gap: 5 mmol/L (ref 3–16)
BUN: 7 mg/dL (ref 7–25)
CO2: 28 mmol/L (ref 21–33)
Calcium: 9.1 mg/dL (ref 8.6–10.3)
Chloride: 109 mmol/L (ref 98–110)
Creatinine: 0.8 mg/dL (ref 0.60–1.30)
EGFR: 86
Glucose: 107 mg/dL (ref 70–100)
Osmolality, Calculated: 292 mOsm/kg (ref 278–305)
Potassium: 4.1 mmol/L (ref 3.5–5.3)
Sodium: 142 mmol/L (ref 133–146)
Total Bilirubin: 0.3 mg/dL (ref 0.0–1.5)
Total Protein: 5.9 g/dL (ref 6.4–8.9)

## 2022-08-21 LAB — CBC
Hematocrit: 34.8 % (ref 35.0–45.0)
Hemoglobin: 11.7 g/dL (ref 11.7–15.5)
MCH: 30.4 pg (ref 27.0–33.0)
MCHC: 33.6 g/dL (ref 32.0–36.0)
MCV: 90.6 fL (ref 80.0–100.0)
MPV: 8 fL (ref 7.5–11.5)
Platelets: 279 10*3/uL (ref 140–400)
RBC: 3.84 10*6/uL (ref 3.80–5.10)
RDW: 18.3 % (ref 11.0–15.0)
WBC: 5.6 10*3/uL (ref 3.8–10.8)

## 2022-08-21 LAB — HEMOGLOBIN A1C: Hemoglobin A1C: 4.9 % (ref 4.0–5.6)

## 2022-08-21 LAB — LIPID PANEL
Cholesterol, Total: 162 mg/dL (ref 0–200)
HDL: 48 mg/dL — ABNORMAL LOW (ref 60–92)
LDL Cholesterol: 87 mg/dL
Non-HDL Cholesterol, Calculated: 114 mg/dL (ref 0–129)
Triglycerides: 133 mg/dL (ref 10–149)

## 2022-08-21 LAB — CHLAMYDIA / GONORRHOEAE DNA URINE
Chlamydia Trachomatis DNA Urine: NEGATIVE
Neisseria gonorrhoeae DNA Urine: NEGATIVE

## 2022-08-21 LAB — VITAMIN B12: Vitamin B-12: 316 pg/mL (ref 180–914)

## 2022-08-21 NOTE — Unmapped (Signed)
Your protein level is low,please increase protein intake in your diet.  Take Otc vit b12 500 units daily if you are not on b12 supplement.

## 2022-11-08 NOTE — Unmapped (Signed)
Ordered gc/c,pls inform pt.

## 2022-11-08 NOTE — Unmapped (Signed)
Patient called the office stating that she was exposed to an STD recently.     Patient states specifically chlamydia/ gonorrhea.     She is requesting labs to be ordered for her as soon as possible.     Thank you!    [facility]  [phone]

## 2022-11-08 NOTE — Unmapped (Signed)
Left detailed msg informing pt that labs were ordered

## 2022-11-09 ENCOUNTER — Other Ambulatory Visit: Admit: 2022-11-09 | Payer: PRIVATE HEALTH INSURANCE

## 2022-11-09 DIAGNOSIS — Z202 Contact with and (suspected) exposure to infections with a predominantly sexual mode of transmission: Secondary | ICD-10-CM

## 2022-11-09 LAB — CHLAMYDIA / GONORRHOEAE DNA URINE
Chlamydia Trachomatis DNA Urine: NEGATIVE
Neisseria gonorrhoeae DNA Urine: NEGATIVE

## 2022-11-09 NOTE — Unmapped (Signed)
Test for chlamydia dn gonorrhea is normal.

## 2022-12-03 MED ORDER — naltrexone (DEPADE) 50 mg tablet
50 | ORAL_TABLET | Freq: Every day | ORAL | 0 refills | 30.00000 days | Status: AC
Start: 2022-12-03 — End: 2023-02-11

## 2022-12-11 MED ORDER — nitrofurantoin, macrocrystal-monohydrate, (MACROBID) 100 MG capsule
100 | ORAL_CAPSULE | ORAL | 2 refills | Status: AC
Start: 2022-12-11 — End: ?

## 2022-12-11 MED ORDER — valACYclovir (VALTREX) 1000 MG tablet
1000 | ORAL_TABLET | ORAL | 3 refills | Status: AC
Start: 2022-12-11 — End: ?

## 2023-01-21 MED ORDER — buPROPion XL (WELLBUTRIN XL) 150 MG 24 hr tablet
150 | ORAL_TABLET | Freq: Every day | ORAL | 0 refills | 30.00000 days | Status: AC
Start: 2023-01-21 — End: 2023-02-11

## 2023-02-11 ENCOUNTER — Ambulatory Visit: Admit: 2023-02-11 | Discharge: 2023-02-11 | Payer: PRIVATE HEALTH INSURANCE

## 2023-02-11 DIAGNOSIS — M810 Age-related osteoporosis without current pathological fracture: Secondary | ICD-10-CM

## 2023-02-11 MED ORDER — naltrexone (DEPADE) 50 mg tablet
50 | ORAL_TABLET | Freq: Every day | ORAL | 0 refills | Status: AC
Start: 2023-02-11 — End: ?

## 2023-02-11 MED ORDER — buPROPion XL (WELLBUTRIN XL) 300 MG 24 hr tablet
300 | ORAL_TABLET | Freq: Every day | ORAL | 0 refills | Status: AC
Start: 2023-02-11 — End: ?

## 2023-02-11 MED ORDER — meloxicam (MOBIC) 15 MG tablet
15 | ORAL_TABLET | Freq: Every day | ORAL | 0 refills | Status: AC
Start: 2023-02-11 — End: ?

## 2023-02-11 NOTE — Progress Notes (Signed)
[facility] M3 Note    Sherry Compton  is a 57 y.o. female  with a past medical history of anxiety, alcohol abuse, who presents to the office for medication refill and menopause concerns.     HPI:  Med refill:  - needs wellbutrin, naltrexone, also sleep med (benzodrine? Unsure of name)  - feels her wellbutrin is helping  - Rx'd aripiprazole by psych [facility] online) but not taking much, made things worse; thinks bipolar dx was not correct bc was dx while drinking    Menopause:   - curious about hormones or other options  - still having hot flashes, had taken Amberin but no longer helping  - everything hurts in her her body for past 4-6 months, getting worse, feels like her bones are deteriorating  - left arm pain/can't raise very high, unsure if bone or muscle pain, main pain concern today  - bilateral wrists with sharp pains up thumb/lateral forearm when picking things up  - clavicles hurt especially while side-sleeping  - has taken Advil & cyclobenzaprine, helps with sleep but not much with pain     Sister w/ ovarian cancer, genetics cleared not hereditary    Wants shingles shot    Stopped smoking in December, has gained 15 pounds since. Goes to [employer].    Pertinent review of Systems:  Review of Systems   Constitutional:  Positive for diaphoresis (hot flashes).   Musculoskeletal:  Positive for joint pain, myalgias and neck pain.   Neurological:  Negative for tingling.         Past Medical History:  Past Medical History:   Diagnosis Date    Abnormal Pap smear of cervix     Alcoholism (CMS-HCC)     Anxiety     STD (sexually transmitted disease)     trch, GC, HSV    Wears glasses     readers       Medications:  Outpatient Encounter Medications as of 02/11/2023   Medication Sig Dispense Refill    buPROPion XL (WELLBUTRIN XL) 300 MG 24 hr tablet Take 1 tablet (300 mg total) by mouth daily. 90 tablet 0    cyanocobalamin (VITAMIN B-12) 1000 MCG tablet Take 1 tablet (1,000 mcg total) by mouth daily.  90 tablet 3    naltrexone (DEPADE) 50 mg tablet Take 1 tablet (50 mg total) by mouth daily. 90 tablet 0    nitrofurantoin, macrocrystal-monohydrate, (MACROBID) 100 MG capsule TAKE 1 CAPSULE BY MOUTH AFTER INTERCOURSE. MAX DOSE IS 1 CAPSULE DAILY. INDICATIONS: UTI PROPHYLAXIS POST-COITAL USE 6 capsule 2    valACYclovir (VALTREX) 1000 MG tablet TAKE 2 TABLETS BY MOUTH TWICE DAILY AT START OF COLD SORE SYMPTOMS FOR 1 DAY 4 tablet 3    [DISCONTINUED] buPROPion XL (WELLBUTRIN XL) 150 MG 24 hr tablet TAKE 1 TABLET(150 MG) BY MOUTH DAILY 90 tablet 0    [DISCONTINUED] naltrexone (DEPADE) 50 mg tablet TAKE 1 TABLET(50 MG) BY MOUTH DAILY 90 tablet 0    meloxicam (MOBIC) 15 MG tablet Take 1 tablet (15 mg total) by mouth daily. 30 tablet 0    [DISCONTINUED] hydrocortisone 1 % cream Apply to vulvar area of itching as needed. Once itching no longer significant, stop use. Do not exceed two weeks maximum unless instructed to do so by your physician. 14.2 g 0    [DISCONTINUED] naloxone (NARCAN) 4 mg/actuation Spry Apply 1 spray in one nostril if needed. Call 911. May repeat dose in other nostril if no response in  3 minutes. 2 each 1     No facility-administered encounter medications on file as of 02/11/2023.       Allergies:  No Known Allergies       Physical Exam:  Vitals:    02/11/23 0809   BP: 136/84   Pulse: 75   SpO2: 98%   Weight: 175 lb 7.8 oz (79.6 kg)   Height: 5' 9 (1.753 m)      Wt Readings from Last 3 Encounters:   02/11/23 175 lb 7.8 oz (79.6 kg)   07/25/22 168 lb 14 oz (76.6 kg)   07/13/22 174 lb (78.9 kg)       Physical Exam  Constitutional:       Appearance: Normal appearance. She is not ill-appearing.   Eyes:      Extraocular Movements: Extraocular movements intact.      Conjunctiva/sclera: Conjunctivae normal.   Cardiovascular:      Rate and Rhythm: Normal rate.   Pulmonary:      Effort: Pulmonary effort is normal.   Musculoskeletal:      Right shoulder: No swelling.      Left shoulder: Tenderness present. No  swelling.      Left upper arm: Tenderness present.      Right wrist: Tenderness present. No swelling.      Left wrist: Tenderness present. No swelling.   Skin:     General: Skin is warm and dry.   Neurological:      General: No focal deficit present.      Mental Status: She is alert and oriented to person, place, and time.   Psychiatric:         Mood and Affect: Mood normal.         Behavior: Behavior normal.         Labs/ Data:   Specimen on 11/09/2022   Component Date Value Ref Range Status    Neisseria gonorrhoeae DNA Urine 11/09/2022 Negative  Negative Final    Chlamydia Trachomatis DNA Urine 11/09/2022 Negative  Negative Final   Specimen on 08/21/2022   Component Date Value Ref Range Status    Non-HDL Cholesterol, Calculated 08/21/2022 114  0 - 129 mg/dL Final    Cholesterol, Total 08/21/2022 162  0 - 200 mg/dL Final    Triglycerides 08/21/2022 133  10 - 149 mg/dL Final    HDL 25/36/6440 48 (L)  60 - 92 mg/dL Final    LDL Cholesterol 08/21/2022 87  mg/dL Final    Sodium 34/74/2595 142  133 - 146 mmol/L Final    Potassium 08/21/2022 4.1  3.5 - 5.3 mmol/L Final    Chloride 08/21/2022 109  98 - 110 mmol/L Final    CO2 08/21/2022 28  21 - 33 mmol/L Final    Anion Gap 08/21/2022 5  3 - 16 mmol/L Final    BUN 08/21/2022 7  7 - 25 mg/dL Final    Creatinine 63/87/5643 0.80  0.60 - 1.30 mg/dL Final    Glucose 32/95/1884 107 (H)  70 - 100 mg/dL Final    Calcium 16/60/6301 9.1  8.6 - 10.3 mg/dL Final    Total Bilirubin 08/21/2022 0.3  0.0 - 1.5 mg/dL Final    AST 60/09/9322 12 (L)  13 - 39 U/L Final    ALT 08/21/2022 8  7 - 52 U/L Final    Alkaline Phosphatase 08/21/2022 53  36 - 125 U/L Final    Total Protein 08/21/2022 5.9 (L)  6.4 - 8.9 g/dL  Final    Albumin 08/21/2022 4.1  3.5 - 5.7 g/dL Final    Osmolality, Calculated 08/21/2022 292  278 - 305 mOsm/kg Final    EGFR 08/21/2022 86   Final    WBC 08/21/2022 5.6  3.8 - 10.8 10E3/uL Final    RBC 08/21/2022 3.84  3.80 - 5.10 10E6/uL Final    Hemoglobin 08/21/2022 11.7   11.7 - 15.5 g/dL Final    Hematocrit 23/55/7322 34.8 (L)  35.0 - 45.0 % Final    MCV 08/21/2022 90.6  80.0 - 100.0 fL Final    MCH 08/21/2022 30.4  27.0 - 33.0 pg Final    MCHC 08/21/2022 33.6  32.0 - 36.0 g/dL Final    RDW 02/54/2706 18.3 (H)  11.0 - 15.0 % Final    Platelets 08/21/2022 279  140 - 400 10E3/uL Final    MPV 08/21/2022 8.0  7.5 - 11.5 fL Final    Hemoglobin A1C 08/21/2022 4.9  4.0 - 5.6 % Final    Vitamin B-12 08/21/2022 316  180 - 914 pg/mL Final    Neisseria gonorrhoeae DNA Urine 08/21/2022 Negative  Negative Final    Chlamydia Trachomatis DNA Urine 08/21/2022 Negative  Negative Final     PHQ9:  Little interest or pleasure in doing things: Several days  Feeling down, depressed, or hopeless: Several days  Trouble falling or staying asleep, or sleeping too much: More than half the days  Feeling tired or having little energy: Nearly every day  Poor appetite or overeating: More than half the days  Feeling bad about yourself - or that you are a failure or have let yourself or your family down: Several days  Trouble concentrating on things, such as reading the newspaper or watching television: Several days  Moving or speaking so slowly that other people could have noticed. Or the opposite - being so fidgety or restless that you have been moving around a lot more than usual: Not at all  Thoughts that you would be better off dead, or of hurting yourself in some way: Not at all  PHQ-9 Total Score: 11  If you checked off any problems, how difficult have these problems made it for you to do your work, take care of things at home, or get along with other people?: Somewhat difficult    GAD7:      02/07/2015    10:00 AM 06/16/2015     3:00 PM 08/23/2021    10:00 AM 02/11/2023     9:21 AM   GAD-7 (Oncology Visit Questionnaire)   Feeling nervous, anxious or on edge: 2 3 3  0   Not being able to stop or control worrying: 1 2 3 1    Worrying too much about different things: 2 3 3 1    Trouble relaxing: 1 2 3 2    Being so  restless that it is hard to sit still: 0 2 3 0   Becoming easily annoyed or irritable: 1 3 2 1    Feeling afraid as if something awful might happen: 0 1 1 0   GAD-7 Total Score 7 16 18 5    If you checked off any problems, how difficult have these problems made it for you to do your work, take care of things at home, or get along with other people? Somewhat difficult Somewhat difficult  Somewhat difficult         Assessment and Plan:  1. Anxiety/Depression med refill  - feels her medications are helping her anx/dep  -  Referral for new in-person psychiatrist  - will fill wellbutrin 300 and naltrexone 50    2. MSK pain  - will get XR of both shoulders  - potential for PT    3. Health maintenance  - will get shingles & pneumonia vaccines today    Sherry Compton  02/11/2023 9:09 AM  M3 [employer]  Family Medicine Clerks[facility]      I personally performed a history and physical exam of the patient and discussed his management with the student . I reviewed the students note and agree with the documented findings and plan of care.     A/p    Bil shoulder pain,no known injury or trigger ,tender on palpation of left shoulder anteriorly.  Shoulder Rom decreased   Decreased abduction,external rotation.    Decreased abduction right shoulder    Xrays ordered   Will need PT,wait for xray results    Meloxicam for pain      Depression /anxiety  with h/o alcohol abuse on wellbutrin    See phq9 and gad7   Refilled wellbutrin    Referral placed to see [facility] psychiatrist    Alcohol use ,sober naloxone refilled    Continue AA program  Ordered bone density   Updated with vaccinations  Follow-up in 2-3 mths for annual exam

## 2023-02-11 NOTE — Patient Instructions (Addendum)
Get xrays   Mobic for pain    Follow-up in 7mths

## 2023-02-11 NOTE — Progress Notes (Signed)
Little interest or pleasure in doing things: Several days  Feeling down, depressed, or hopeless: Several days  Trouble falling or staying asleep, or sleeping too much: More than half the days  Feeling tired or having little energy: Nearly every day  Poor appetite or overeating: More than half the days  Feeling bad about yourself - or that you are a failure or have let yourself or your family down: Several days  Trouble concentrating on things, such as reading the newspaper or watching television: Several days  Moving or speaking so slowly that other people could have noticed. Or the opposite - being so fidgety or restless that you have been moving around a lot more than usual: Not at all  Thoughts that you would be better off dead, or of hurting yourself in some way: Not at all  PHQ-9 Total Score: 11  If you checked off any problems, how difficult have these problems made it for you to do your work, take care of things at home, or get along with other people?: Somewhat difficult      02/07/2015    10:00 AM 06/16/2015     3:00 PM 08/23/2021    10:00 AM 02/11/2023     9:21 AM   GAD-7 (Oncology Visit Questionnaire)   Feeling nervous, anxious or on edge: 2 3 3  0   Not being able to stop or control worrying: 1 2 3 1    Worrying too much about different things: 2 3 3 1    Trouble relaxing: 1 2 3 2    Being so restless that it is hard to sit still: 0 2 3 0   Becoming easily annoyed or irritable: 1 3 2 1    Feeling afraid as if something awful might happen: 0 1 1 0   GAD-7 Total Score 7 16 18 5    If you checked off any problems, how difficult have these problems made it for you to do your work, take care of things at home, or get along with other people? Somewhat difficult Somewhat difficult  Somewhat difficult

## 2023-02-13 ENCOUNTER — Inpatient Hospital Stay: Admit: 2023-02-13 | Discharge: 2023-03-11 | Payer: PRIVATE HEALTH INSURANCE

## 2023-02-13 DIAGNOSIS — M25511 Pain in right shoulder: Secondary | ICD-10-CM

## 2023-02-13 NOTE — Progress Notes (Signed)
Your xray of both shoulders showed mild osteoarthritis,referred to see ortho  Pls call [phone] for appt.

## 2023-04-15 ENCOUNTER — Ambulatory Visit: Payer: PRIVATE HEALTH INSURANCE

## 2023-04-22 ENCOUNTER — Ambulatory Visit: Admit: 2023-04-22 | Payer: PRIVATE HEALTH INSURANCE

## 2023-04-22 DIAGNOSIS — L650 Telogen effluvium: Secondary | ICD-10-CM

## 2023-04-22 LAB — IRON STUDIES
% Iron Saturation: 16.1 % (ref 15.0–55.0)
Iron: 66 ug/dL (ref 50–212)
TIBC: 410 ug/dL (ref 265–497)

## 2023-04-22 LAB — COMPREHENSIVE METABOLIC PANEL
ALT: 8 U/L (ref 7–52)
AST: 13 U/L (ref 13–39)
Albumin: 4.3 g/dL (ref 3.5–5.7)
Alkaline Phosphatase: 72 U/L (ref 36–125)
Anion Gap: 9 mmol/L (ref 3–16)
BUN: 11 mg/dL (ref 7–25)
CO2: 27 mmol/L (ref 21–33)
Calcium: 8.9 mg/dL (ref 8.6–10.3)
Chloride: 104 mmol/L (ref 98–110)
Creatinine: 0.67 mg/dL (ref 0.60–1.30)
EGFR: >90
Glucose: 98 mg/dL (ref 70–100)
Osmolality, Calculated: 289 mOsm/kg (ref 278–305)
Potassium: 4.8 mmol/L (ref 3.5–5.3)
Sodium: 140 mmol/L (ref 133–146)
Total Bilirubin: 0.7 mg/dL (ref 0.0–1.5)
Total Protein: 6.6 g/dL (ref 6.4–8.9)

## 2023-04-22 LAB — DIFFERENTIAL
Basophils Absolute: 26 /uL (ref 0–200)
Basophils Relative: 0.6 % (ref 0.0–1.0)
Eosinophils Absolute: 56 /uL (ref 15–500)
Eosinophils Relative: 1.3 % (ref 0.0–8.0)
Lymphocytes Absolute: 1415 /uL (ref 850–3900)
Lymphocytes Relative: 32.9 % (ref 15.0–45.0)
Monocytes Absolute: 331 /uL (ref 200–950)
Monocytes Relative: 7.7 % (ref 0.0–12.0)
Neutrophils Absolute: 2473 /uL (ref 1500–7800)
Neutrophils Relative: 57.5 % (ref 40.0–80.0)
nRBC: 0 /100 WBC (ref 0–0)

## 2023-04-22 LAB — CBC
Hematocrit: 35.2 % (ref 35.0–45.0)
Hemoglobin: 11.7 g/dL (ref 11.7–15.5)
MCH: 30.1 pg (ref 27.0–33.0)
MCHC: 33.3 g/dL (ref 32.0–36.0)
MCV: 90.3 fL (ref 80.0–100.0)
MPV: 8.8 fL (ref 7.5–11.5)
Platelets: 258 10*3/uL (ref 140–400)
RBC: 3.9 10*6/uL (ref 3.80–5.10)
RDW: 17.1 % — ABNORMAL HIGH (ref 11.0–15.0)
WBC: 4.3 10*3/uL (ref 3.8–10.8)

## 2023-04-22 LAB — T4, FREE: Free T4: 0.85 ng/dL (ref 0.61–1.76)

## 2023-04-22 LAB — FERRITIN: Ferritin: 4.4 ng/mL — ABNORMAL LOW (ref 11.0–306.8)

## 2023-04-22 LAB — TSH: TSH: 0.55 u[IU]/mL (ref 0.45–4.12)

## 2023-04-22 LAB — VITAMIN D 25 HYDROXY: Vit D, 25-Hydroxy: 25.7 ng/mL — ABNORMAL LOW (ref 30.0–100.0)

## 2023-07-19 ENCOUNTER — Ambulatory Visit: Admit: 2023-07-19 | Discharge: 2023-07-19 | Payer: PRIVATE HEALTH INSURANCE

## 2023-07-19 ENCOUNTER — Ambulatory Visit: Admit: 2023-07-19 | Payer: PRIVATE HEALTH INSURANCE

## 2023-07-19 DIAGNOSIS — Z113 Encounter for screening for infections with a predominantly sexual mode of transmission: Secondary | ICD-10-CM

## 2023-07-19 LAB — CHLAMYDIA / GONORRHOEAE DNA URINE
Chlamydia Trachomatis DNA Urine: NEGATIVE
Neisseria gonorrhoeae DNA Urine: NEGATIVE

## 2023-07-19 LAB — TREPONEMA PALLIDUM AB WITH REFLEX: Treponema Pallidum: NEGATIVE

## 2023-07-19 LAB — HIV 1+2 ANTIBODY/ANTIGEN WITH REFLEX: HIV 1+2 AB/AGN: NONREACTIVE

## 2023-07-19 MED ORDER — valACYclovir (VALTREX) 1000 MG tablet
1000 | ORAL_TABLET | ORAL | 3 refills | Status: AC
Start: 2023-07-19 — End: ?

## 2023-07-19 MED ORDER — buPROPion HCL (WELLBUTRIN XL) 300 MG 24 hr tablet
300 | ORAL_TABLET | Freq: Every day | ORAL | 3 refills | Status: AC
Start: 2023-07-19 — End: ?

## 2023-07-19 MED ORDER — nitrofurantoin, macrocrystal-monohydrate, (MACROBID) 100 MG capsule
100 | ORAL_CAPSULE | ORAL | 2 refills | Status: AC
Start: 2023-07-19 — End: ?

## 2023-07-19 MED ORDER — naltrexone (DEPADE) 50 mg tablet
50 | ORAL_TABLET | Freq: Every day | ORAL | 3 refills | Status: AC
Start: 2023-07-19 — End: ?

## 2023-07-19 MED ORDER — cyanocobalamin (VITAMIN B-12) 1000 MCG tablet
1000 | ORAL_TABLET | Freq: Every day | ORAL | 3 refills | Status: AC
Start: 2023-07-19 — End: ?

## 2023-07-19 NOTE — Progress Notes (Signed)
Cures Act Rule Information: The [employer] of this note has indicated they intend to limit the ability of other clinicians and staff to see this note by marking the note sensitive in the electronic health record.  The [employer] has documented that limited access to the contents of this note are allowable under the Cures Rule because a Privacy Exception exists.  Clinicians and staff who can view the note will find additional information in the Notes tab of Chart Review.

## 2023-07-19 NOTE — Progress Notes (Signed)
[facility]  [address], [address]  [place] [address]    Subjective:  HPI: Sherry Compton is a(n) 58 y.o. female here for   Chief Complaint   Patient presents with    Medication Refill     STD test, dermatologist recommended iron infusions.     Rx Refill:  - Needing refills of prescriptions.    Iron Deficiency:  - Had bad hair loss. Dermatologist did blood test and found that she was low in Iron and Vitamin D. Started on 50 and given iron.   - Sister is on an iron infusion.  - She is s/p Gastric Sleeve about 20 years ago (by Dr. Delos Haring, at private office).   - Having terrible constipation so far on oral iron, as she hasn't stooled in a week.    STI Testing:  - Has had unprotected sex. Has had some discharge. Unsure if has odor, as she cannot smell. Denies any rashes.   - Takes one Welbutrin and Naltrexone. Struggles with alcohol.   - Doing AA and CR.    ROS: Please see HPI.     Past medical history and past surgical history were reviewed prior to visit:  Past Medical History:   Diagnosis Date    Abnormal Pap smear of cervix     Alcoholism (CMS-HCC)     Anxiety     STD (sexually transmitted disease)     trch, GC, HSV    Wears glasses     readers     Past Surgical History:   Procedure Laterality Date    AUGMENTATION BREAST ENDOSCOPIC      CYSTOSCOPY N/A 11/08/2020    Procedure: CYSTOSCOPY;  Surgeon: Lamona Curl, MD;  Location: [facility] OR;  Service: Gynecology;  Laterality: N/A;    ENDOMETRIAL ABLATION      gastric sleeve bariatric surgeyr by dr Clementeen Graham      REDUCTION BREAST  01/18/2021    SUSPENSION BLADDER NECK TRANS-VAGINAL SLING N/A 11/08/2020    Procedure: PERINEORRHAPHY, TRANS-VAGINAL TAPE;;  Surgeon: Lamona Curl, MD;  Location: [facility] OR;  Service: Gynecology;  Laterality: N/A;    tummy tuck  04/16/2021    VULVA SURGERY N/A 11/08/2020    Procedure: LABIAPLASTY;  Surgeon: Lamona Curl, MD;  Location: [facility] OR;  Service: Gynecology;  Laterality: N/A;       Home Meds:   Reviewed  medications, see medication list.  Current Outpatient Medications on File Prior to Visit   Medication Sig Dispense Refill    [DISCONTINUED] buPROPion XL (WELLBUTRIN XL) 300 MG 24 hr tablet Take 1 tablet (300 mg total) by mouth daily. 90 tablet 0    [DISCONTINUED] cyanocobalamin (VITAMIN B-12) 1000 MCG tablet Take 1 tablet (1,000 mcg total) by mouth daily. 90 tablet 3    [DISCONTINUED] naltrexone (DEPADE) 50 mg tablet Take 1 tablet (50 mg total) by mouth daily. 90 tablet 0    [DISCONTINUED] nitrofurantoin, macrocrystal-monohydrate, (MACROBID) 100 MG capsule TAKE 1 CAPSULE BY MOUTH AFTER INTERCOURSE. MAX DOSE IS 1 CAPSULE DAILY. INDICATIONS: UTI PROPHYLAXIS POST-COITAL USE 6 capsule 2    [DISCONTINUED] valACYclovir (VALTREX) 1000 MG tablet TAKE 2 TABLETS BY MOUTH TWICE DAILY AT START OF COLD SORE SYMPTOMS FOR 1 DAY 4 tablet 3    meloxicam (MOBIC) 15 MG tablet Take 1 tablet (15 mg total) by mouth daily. 30 tablet 0     No current facility-administered medications on file prior to visit.      Reviewed allergies,  see allergy list.  No Known Allergies     Objective:  Vitals:    07/19/23 0918   BP: 133/86   BP Location: Left upper arm   Patient Position: Sitting   BP Cuff Size: Regular   Pulse: 72   Resp: 16   Temp: 96 F (35.6 C)   SpO2: 98%   Weight: 181 lb (82.1 kg)   Height: 5' 9 (1.753 m)     Body mass index is 26.73 kg/m.     Physical Exam:   General: Pleasant middle-age female in no apparent distress.    Psych: Euthymic mood/affect. Speech is normal/non-pressured.     Lab Results   Component Value Date    WBC 4.3 04/22/2023    HGB 11.7 04/22/2023    HCT 35.2 04/22/2023    MCV 90.3 04/22/2023    PLT 258 04/22/2023     Lab Results   Component Value Date    IRON 66 04/22/2023    TIBC 410 04/22/2023    FERRITIN 4.4 (L) 04/22/2023     Lab Results   Component Value Date    VITD25H 25.7 (L) 04/22/2023      Upcoming Health Maintenance       Comprehensive Physical Exam (Yearly)  Never done    Immunization: Hepatitis B (1  of 3 - 19+ 3-dose series)  Never done    Osteoporosis Screening (DXA Scan) (Every 5 Years)  Ordered on 02/11/2023    Depression Monitoring (PHQ-9) (Every 3 Months)  Overdue since 05/12/2023    Immunization: Influenza (MyChart) (1)  Next due on 08/25/2023    Cervical Cancer Screening/Pap Smear (MyChart) (Every 3 Years)  Next due on 11/15/2023    Immunization: DTaP/Tdap/Td (3 - Td or Tdap)  Next due on 11/01/2026    Hepatitis C Screening (MyChart)   Discontinued    Mammogram (MyChart)   Discontinued    Colorectal Cancer Screening (MyChart)   Discontinued              Assessment/Plan: Sherry Compton is a(n) 57 y.o. female, here for:    Sherry Compton was seen today for medication refill.    Diagnoses and all orders for this visit:    Encounter for screening examination for sexually transmitted disease  -     HIV 1+2 Antibody/Antigen with Reflex; Future  -     Syphilis Screening Daune Perch); Future  -     Chlamydia / Gonorrhoeae DNA Urine; Future    Alcohol abuse  -     naltrexone (DEPADE) 50 mg tablet; Take 1 tablet (50 mg total) by mouth daily. Indications: alcoholism    History of bariatric surgery  -     cyanocobalamin (VITAMIN B-12) 1000 MCG tablet; Take 1 tablet (1,000 mcg total) by mouth daily.    History of iron deficiency    Colon cancer screening  -     Open Access Colonoscopy    Current moderate episode of major depressive disorder, unspecified whether recurrent (CMS-HCC)  -     buPROPion HCL (WELLBUTRIN XL) 300 MG 24 hr tablet; Take 1 tablet (300 mg total) by mouth daily.    Herpes labialis  -     valACYclovir (VALTREX) 1000 MG tablet; TAKE 2 TABLETS BY MOUTH TWICE DAILY AT START OF COLD SORE SYMPTOMS FOR 1 DAY    History of recurrent UTIs  -     nitrofurantoin, macrocrystal-monohydrate, (MACROBID) 100 MG capsule; TAKE 1 CAPSULE BY MOUTH AFTER INTERCOURSE. MAX DOSE IS 1 CAPSULE  DAILY. INDICATIONS: UTI PROPHYLAXIS POST-COITAL USE    Vitamin B12 deficiency due to intestinal malabsorption       Iron deficiency: Patient only had  mildly low ferritin, but is status post bariatric surgery and thus has malabsorption issues.  She would do well with iron infusion for this reason, especially as she is having constipation from current repletion method.    Vitamin D/B12 deficiency: Patient currently on repletion for this.  See above.    History of alcohol abuse: Doing well on current naltrexone.  Recommend that she discuss other options with psychiatrist if interested (patient would benefit from formal evaluation to rule out underlying mood disorder given request for STI testing/chronic alcohol abuse - this was not explicitly discussed with patient during the visit). I am concerned that Wellbutrin may be inappropriate and patient requires mood stabilizer.    STI screening: Reports unprotected intercourse, but no active symptoms.  Ordered above screening labs.    Follow up: PRN      Vallery Ridge, MD  07/19/2023

## 2023-07-19 NOTE — Patient Instructions (Signed)
I have ordered the screening labs that we discussed at your visit today.  I will reach out to you if further action is necessary.    Please complete all of prescribed vitamin D.  You will only need to take daily over-the-counter vitamin D (at least 1000 IU daily).  I have refilled your vitamin D.  I will try and coordinate you getting iron infusions because of your history of bariatric surgery.    I have refilled your medications.  Please discuss alternatives to naltrexone with a psychiatrist.  I believe the naltrexone is working well for you though.

## 2023-07-23 NOTE — Telephone Encounter (Signed)
Patient called the colo line . Patient is scheduled for 10/07/2023 at 9:30 am to arrive at 8:00 am letter sent thank you

## 2023-07-23 NOTE — H&P (Signed)
Open Access Endoscopy Procedure History         Procedure: Colonoscopy    Date: 10/07/2023    Patient ID: Pt. Is a 57 y.o. year old female    Indication(s):  screening    Her primary care physician is Veatrice Kells, MD     Referring Provider: Veatrice Kells, MD    Prior colonoscopy? No    Type of sedation: Monitored Anesthesia Care (MAC)      Interpreter services needed: No    Family history of colonic polyps or cancer. No    Any limitations on blood transfusions? No    Can the patient give consent? Yes    Is the patient on anticoagulation or antiplatelet drugs? No    Prep given: SUPREP Bowel Prep Kit    Past Medical History:   Diagnosis Date    Abnormal Pap smear of cervix     Alcoholism (CMS-HCC)     Anxiety     STD (sexually transmitted disease)     trch, GC, HSV    Wears glasses     readers     Current Medications as of 07/23/2023 12:19 PM       Outpatient Medications         Quantity Refills Start End    buPROPion HCL (WELLBUTRIN XL) 300 MG 24 hr tablet 90 tablet 3 07/19/2023 --    cyanocobalamin (VITAMIN B-12) 1000 MCG tablet 90 tablet 3 07/19/2023 --    meloxicam (MOBIC) 15 MG tablet 30 tablet 0 02/11/2023 --    naltrexone (DEPADE) 50 mg tablet 90 tablet 3 07/19/2023 --    nitrofurantoin, macrocrystal-monohydrate, (MACROBID) 100 MG capsule 6 capsule 2 07/19/2023 --    valACYclovir (VALTREX) 1000 MG tablet 30 tablet 3 07/19/2023 --                  All Immunizations including Deferred   Administered Date(s) Administered    COVID-19, mRNA, Pfizer monovalent 06/18/2020, 07/26/2020    Pneumococcal conjugate, 20-valent 02/11/2023    Zoster, recombinant 02/11/2023    tdap 01/16/2010, 11/01/2016   Deferred Date(s) Deferred    Influenza, quadrivalent, preservative-free 09/28/2014, 11/15/2014     Past Surgical History:   Procedure Laterality Date    AUGMENTATION BREAST ENDOSCOPIC      CYSTOSCOPY N/A 11/08/2020    Procedure: CYSTOSCOPY;  Surgeon: Lamona Curl, MD;  Location: [facility] OR;  Service: Gynecology;  Laterality:  N/A;    ENDOMETRIAL ABLATION      gastric sleeve bariatric surgeyr by dr Clementeen Graham      REDUCTION BREAST  01/18/2021    SUSPENSION BLADDER NECK TRANS-VAGINAL SLING N/A 11/08/2020    Procedure: PERINEORRHAPHY, TRANS-VAGINAL TAPE;;  Surgeon: Lamona Curl, MD;  Location: [facility] OR;  Service: Gynecology;  Laterality: N/A;    tummy tuck  04/16/2021    VULVA SURGERY N/A 11/08/2020    Procedure: LABIAPLASTY;  Surgeon: Lamona Curl, MD;  Location: [facility] OR;  Service: Gynecology;  Laterality: N/A;     No Known Allergies    Social History     Tobacco Use    Smoking status: Former     Current packs/day: 0.00     Types: Cigarettes     Quit date: 11/07/1992     Years since quitting: 30.7    Smokeless tobacco: Never   Substance Use Topics    Alcohol use: No        BMI: Estimated body mass index is 26.73 kg/m as calculated from the following:  Height as of 07/19/23: 5' 9 (1.753 m).    Weight as of 07/19/23: 181 lb (82.1 kg).      Drewey Begue, MA  07/23/2023  12:19 PM

## 2023-07-24 MED ORDER — sodium,potassium,mag sulfates (SUPREP BOWEL PREP KIT) 17.5-3.13-1.6 gram SolR
17.5-3.13-1.6 | ORAL | 0 refills | PRN
Start: 2023-07-24 — End: 2023-09-03

## 2023-07-25 NOTE — Telephone Encounter (Signed)
CLM to schedule iron infusion.  Left call back number.

## 2023-07-26 ENCOUNTER — Ambulatory Visit: Admit: 2023-07-26 | Payer: PRIVATE HEALTH INSURANCE

## 2023-07-26 DIAGNOSIS — Z9884 Bariatric surgery status: Secondary | ICD-10-CM

## 2023-07-26 DIAGNOSIS — E611 Iron deficiency: Secondary | ICD-10-CM

## 2023-07-26 MED ORDER — sodium chloride 0.9 % IV infusion
Freq: Every day | INTRAVENOUS | PRN
Start: 2023-07-26 — End: 2023-07-26
  Administered 2023-07-26: 19:00:00 via INTRAVENOUS

## 2023-07-26 MED ORDER — EPINEPHrine injection 0.3 mg
1 | Freq: Every day | INTRAMUSCULAR | PRN
Start: 2023-07-26 — End: 2023-07-26

## 2023-07-26 MED ORDER — diphenhydrAMINE (BENADRYL) injection 25 mg
50 | Freq: Every day | INTRAMUSCULAR | PRN
Start: 2023-07-26 — End: 2023-07-26

## 2023-07-26 MED ORDER — iron dextran (INFED) 50 mg in sodium chloride 0.9% 10 mL Test Dose
Freq: Once | INTRAMUSCULAR | Status: AC
Start: 2023-07-26 — End: 2023-07-26
  Administered 2023-07-26: 19:00:00 via INTRAVENOUS

## 2023-07-26 MED ORDER — heparin lock flush 100 unit/mL syringe 500 Units
100 | Freq: Every day | INTRAVENOUS | PRN
Start: 2023-07-26 — End: 2023-07-26

## 2023-07-26 MED ORDER — sodium chloride 0.9 % IV infusion 500 mL
Freq: Every day | INTRAVENOUS | PRN
Start: 2023-07-26 — End: 2023-07-26

## 2023-07-26 MED ORDER — hydrocortisone sod succ (PF) (SOLU-CORTEF) injection 100 mg
100 | Freq: Every day | INTRAMUSCULAR | PRN
Start: 2023-07-26 — End: 2023-07-26

## 2023-07-26 MED ORDER — famotidine (PF) (PEPCID) injection 20 mg
20 | Freq: Every day | INTRAVENOUS | PRN
Start: 2023-07-26 — End: 2023-07-26

## 2023-07-26 MED ORDER — iron dextran (INFED) 950 mg in sodium chloride 0.9 % 250 mL IVPB
Freq: Once | INTRAVENOUS | Status: AC
Start: 2023-07-26 — End: 2023-07-26
  Administered 2023-07-26: 19:00:00 via INTRAVENOUS

## 2023-07-26 MED ORDER — albuterol (PROVENTIL-PROAIR-VENTOLIN) 90 mcg/actuation inhaler 2 puff
90 | RESPIRATORY_TRACT | PRN
Start: 2023-07-26 — End: 2023-07-26

## 2023-07-26 MED FILL — INFED 50 MG/ML INJECTION SOLUTION: 50 50 mg/mL | INTRAMUSCULAR | Qty: 19

## 2023-07-26 MED FILL — INFED 50 MG/ML INJECTION SOLUTION: 50 50 mg/mL | INTRAMUSCULAR | Qty: 1

## 2023-07-26 NOTE — Progress Notes (Signed)
Patient is here for Infed infusion. VSS. PIV started in [facility]  g22 with good blood return and flushes easily.Pt oriented to unit and infusion procedure. Infed test dose given. Monitored for unualities. Infed full dose given and  infused without reaction, followed by Normal saline flushed. Pt tolerated the infusion. PIV removed with tip intact. Patient declined AVS.Patient left in stable condition

## 2023-09-03 ENCOUNTER — Inpatient Hospital Stay: Admit: 2023-09-03 | Discharge: 2023-09-09 | Payer: PRIVATE HEALTH INSURANCE

## 2023-09-03 DIAGNOSIS — Z1231 Encounter for screening mammogram for malignant neoplasm of breast: Secondary | ICD-10-CM

## 2023-09-03 MED ORDER — sodium,potassium,mag sulfates (SUPREP) 17.5-3.13-1.6 gram SolR
17.5-3.13-1.6 | ORAL | 0 refills | Status: AC
Start: 2023-09-03 — End: 2023-11-29

## 2023-09-27 NOTE — Progress Notes (Signed)
Voice-mail left for Pt trying to confirm colo appointment on 10/14 with arrival time of 0800. Left details to pick up prep as soon as possible, follow the clear liquid diet, to have Pt arrange a ride for after procedure with responsible adult, and call back number of [phone] for any questions about appointment.

## 2023-10-07 ENCOUNTER — Ambulatory Visit: Payer: PRIVATE HEALTH INSURANCE | Attending: Gastroenterology

## 2023-10-07 NOTE — Telephone Encounter (Signed)
Pt is sick and will not be making her appt today(10/14) at 9:30am. Pt stated Sherry Compton is aware she will not be coming in. Pt wanted to wait until she is feeling better to reschedule.

## 2023-10-29 ENCOUNTER — Ambulatory Visit: Admit: 2023-10-30 | Payer: PRIVATE HEALTH INSURANCE

## 2023-10-29 ENCOUNTER — Other Ambulatory Visit: Admit: 2023-10-29 | Payer: PRIVATE HEALTH INSURANCE

## 2023-10-29 ENCOUNTER — Ambulatory Visit: Admit: 2023-10-29 | Payer: PRIVATE HEALTH INSURANCE

## 2023-10-29 DIAGNOSIS — Z202 Contact with and (suspected) exposure to infections with a predominantly sexual mode of transmission: Secondary | ICD-10-CM

## 2023-10-29 DIAGNOSIS — K6289 Other specified diseases of anus and rectum: Secondary | ICD-10-CM

## 2023-10-29 LAB — CHLAMYDIA / GONORRHOEAE DNA URINE
Chlamydia Trachomatis DNA Urine: NEGATIVE
Neisseria gonorrhoeae DNA Urine: NEGATIVE

## 2023-10-29 LAB — CHLAMYDIA / GONORRHOEAE DNA SWAB
Chlamydia Trachomatis DNA Swab: NEGATIVE
Chlamydia Trachomatis DNA Swab: NEGATIVE
Neisseria gonorrhoeae DNA Swab: NEGATIVE
Neisseria gonorrhoeae DNA Swab: NEGATIVE

## 2023-10-29 LAB — HIV 1+2 ANTIBODY/ANTIGEN WITH REFLEX: HIV 1+2 AB/AGN: NONREACTIVE

## 2023-10-29 LAB — TREPONEMA PALLIDUM AB WITH REFLEX: Treponema Pallidum: NEGATIVE

## 2023-10-29 MED ORDER — triamcinolone (KENALOG) 0.1 % cream
0.1 | Freq: Two times a day (BID) | TOPICAL | 0 refills | 1.00000 days | Status: AC
Start: 2023-10-29 — End: 2023-11-29

## 2023-10-29 NOTE — Progress Notes (Signed)
[facility]  FAMILY MEDICINE  [address]   [place] [address]    This patient chart was reviewed as part of pre-visit planning prior to the office visit.    HPI:   Chief Complaint   Patient presents with    Establish Care     Std testing     Sherry Compton is a 57 y.o. female (DOB: [birth year]) who presents to the [facility] clinic today for STI testing.    Rash today. Unprotected sex -- oral, anal, and vaginal, oct 26, 31, nov 1. Rash 2-3 days, worse since yesterday. Diarrhea overnight. Stomach pain. Throat sore. Coughing. Also has a rash on her low back and upper glutes, as well on the left breast sparing the areola. Recently had nipples pierced. Denies urinary sx. Denies fevers.      Upcoming Health Maintenance       Tobacco Cessation Readiness (Yearly)  Never done    Comprehensive Physical Exam (Yearly)  Never done    Immunization: Hepatitis B (1 of 3 - 19+ 3-dose series)  Never done    Osteoporosis Screening (DXA Scan) (Every 5 Years)  Never done    Lung Cancer Screening (Yearly)  Never done    Alcohol Misuse Screening (Yearly)  Overdue since 07/26/2023    Immunization: Influenza (MyChart) (1)  Overdue since 08/25/2023    Cervical Cancer Screening/Pap Smear (MyChart) (Every 3 Years)  Due soon on 11/15/2023    Depression Monitoring (PHQ-9) (Yearly)  Next due on 10/28/2024    Immunization: DTaP/Tdap/Td (3 - Td or Tdap)  Next due on 11/01/2026    Hepatitis C Screening (MyChart)   Discontinued    Mammogram (MyChart)   Discontinued    Colorectal Cancer Screening (MyChart)   Discontinued            Review of Systems:   ROS is WNL, except for as documented in the HPI.    I have reviewed the patient's medical history in detail in the electronic medical record.  Past Medical History:   Diagnosis Date    Abnormal Pap smear of cervix     Alcoholism (CMS-HCC)     Anxiety     STD (sexually transmitted disease)     trch, GC, HSV    Wears glasses     readers     Past Surgical History:    Procedure Laterality Date    AUGMENTATION BREAST ENDOSCOPIC      CYSTOSCOPY N/A 11/08/2020    Procedure: CYSTOSCOPY;  Surgeon: Lamona Curl, MD;  Location: [facility] OR;  Service: Gynecology;  Laterality: N/A;    ENDOMETRIAL ABLATION      gastric sleeve bariatric surgeyr by dr Clementeen Graham      REDUCTION BREAST  01/18/2021    SUSPENSION BLADDER NECK TRANS-VAGINAL SLING N/A 11/08/2020    Procedure: PERINEORRHAPHY, TRANS-VAGINAL TAPE;;  Surgeon: Lamona Curl, MD;  Location: [facility] OR;  Service: Gynecology;  Laterality: N/A;    tummy tuck  04/16/2021    VULVA SURGERY N/A 11/08/2020    Procedure: LABIAPLASTY;  Surgeon: Lamona Curl, MD;  Location: [facility] OR;  Service: Gynecology;  Laterality: N/A;     No Known Drug Allergies or Adverse Reactions  Current Outpatient Medications on File Prior to Visit   Medication Sig Dispense Refill    buPROPion HCL (WELLBUTRIN XL) 300 MG 24 hr tablet Take 1 tablet (300 mg total) by mouth daily. 90 tablet 3    cyanocobalamin (VITAMIN B-12) 1000 MCG  tablet Take 1 tablet (1,000 mcg total) by mouth daily. 90 tablet 3    meloxicam (MOBIC) 15 MG tablet Take 1 tablet (15 mg total) by mouth daily. 30 tablet 0    naltrexone (DEPADE) 50 mg tablet Take 1 tablet (50 mg total) by mouth daily. Indications: alcoholism 90 tablet 3    nitrofurantoin, macrocrystal-monohydrate, (MACROBID) 100 MG capsule TAKE 1 CAPSULE BY MOUTH AFTER INTERCOURSE. MAX DOSE IS 1 CAPSULE DAILY. INDICATIONS: UTI PROPHYLAXIS POST-COITAL USE 6 capsule 2    sodium,potassium,mag sulfates (SUPREP) 17.5-3.13-1.6 gram SolR TAKE 354 ML BY MOUTH AS DIRECTED FOR UP TO 1 DAY. 354 mL 0    valACYclovir (VALTREX) 1000 MG tablet TAKE 2 TABLETS BY MOUTH TWICE DAILY AT START OF COLD SORE SYMPTOMS FOR 1 DAY 30 tablet 3     No current facility-administered medications on file prior to visit.        Physical Exam:  BP 144/90   Pulse 82   Temp 97.6 F (36.4 C)   Ht 5' 9 (1.753 m)   Wt 195 lb 8.8 oz (88.7 kg)   SpO2 97%   BMI 28.88 kg/m    Physical  Exam  Constitutional:       General: She is not in acute distress.     Appearance: She is not ill-appearing, toxic-appearing or diaphoretic.   HENT:      Head: Normocephalic and atraumatic.   Eyes:      General: No scleral icterus.  Cardiovascular:      Rate and Rhythm: Normal rate and regular rhythm.   Pulmonary:      Effort: Pulmonary effort is normal. No respiratory distress.   Musculoskeletal:      Right lower leg: No edema.      Left lower leg: No edema.   Skin:     General: Skin is warm and dry.      Coloration: Skin is not jaundiced.      Comments: Maculopapular rash over low back and upper buttocks without drainage. Bilateral newly placed nipple piercings with a red, raised rash at 6 o'clock on the breast appearing to spare the areola.   Neurological:      General: No focal deficit present.      Mental Status: She is alert and oriented to person, place, and time.      Cranial Nerves: No cranial nerve deficit.   Psychiatric:         Mood and Affect: Mood normal.         Behavior: Behavior normal.         Thought Content: Thought content normal.         Judgment: Judgment normal.         Assessment/Plan:    1. Possible exposure to STI  Chlamydia / Gonorrhoeae DNA Urine    HIV 1+2 Antibody/Antigen with Reflex    Syphilis Screening Daune Perch)    Chlamydia / Gonorrhoeae DNA Swab    Chlamydia / Gonorrhoeae DNA Swab      2. Skin rash  triamcinolone (KENALOG) 0.1 % cream      3. Sore throat  Chlamydia / Gonorrhoeae DNA Swab    Chlamydia / Gonorrhoeae DNA Swab      4. Anal pain  Chlamydia / Gonorrhoeae DNA Swab    Chlamydia / Gonorrhoeae DNA Swab           Orders Placed This Encounter   Procedures    Chlamydia / Gonorrhoeae DNA Urine  Standing Status:   Future     Number of Occurrences:   1     Standing Expiration Date:   10/28/2024    HIV 1+2 Antibody/Antigen with Reflex     Standing Status:   Future     Number of Occurrences:   1     Standing Expiration Date:   05/12/2024    Syphilis Screening Daune Perch)     Standing  Status:   Future     Number of Occurrences:   1     Standing Expiration Date:   05/12/2024    Chlamydia / Gonorrhoeae DNA Swab     Standing Status:   Future     Standing Expiration Date:   10/28/2024    Chlamydia / Gonorrhoeae DNA Swab     Standing Status:   Future     Standing Expiration Date:   10/28/2024       - discussed the timeline for STI testing -- patient would like to proceed with testing knowing that we may need to retest. Because of timeline of throat sx and diarrhea, will swab for GC as well.  - Triamcinolone cream for rash. Rec patient contact piercer or manufacturer and enquire as to the metals in the bar as this may be an allergic rxn as well.    Follow-up pending testing. Please call the office or reach out via MyChart with any questions, concerns, or worsening symptoms as needed.    Starleen Blue, DO, PGY-2  Family Medicine Resident  10/29/2023, 8:38 PM

## 2023-10-29 NOTE — Progress Notes (Signed)
[name]  DEPARTMENT OF FAMILY MEDICINE  DAILY PROGRESS NOTE     ATTESTATION  Patient seen on this date  Discussed with the resident physician & agree with evaluation  See my summary below    My summary:  Concern re STI screening: ck for GC/C and also labs for others, like HIV/syphilis etc  Rash on lower back.buttock: TAC cream    Melissa Montane, MD, FAAFP   Associate Professor of Family Medicine [facility])   [facility]

## 2023-11-20 NOTE — Progress Notes (Signed)
Formatting of this note is different from the original.  Visit Type: New patient    Consulting Provider: OTHER, SELF-REFERRAL    Reason for Visit:   Chief Complaint   Patient presents with    NEW PATIENT VISIT    Ear Problem     Right ear pressure     HPI       Ear Problem     Additional comments: Right ear pressure        Last edited by Tiana Loft, MA on 11/20/2023  2:46 PM.       History of present Illness: Sherry Compton is a 57 year old female who presents at the request of OTHER, SELF-REFERRAL for evaluation and consultation of ear pressure and fullness.  Patient said the symptoms started about 3 weeks ago.  She said she is not sure how she started she does not recall an obvious upper respiratory infection but she feels like there is been persistent pressure and fullness.  She has tried auto insufflation without significant improvement..    Past Medical History:   Diagnosis Date    Nondependent alcohol abuse     Overweight and obesity      Past Surgical History:   Procedure Laterality Date    BREAST PROSTHESIS, SILICONE      LABIOPLASTY  11/08/2020    MAM DIAG BILAT W IMP      PERINEORRHAPHY  11/08/2020    SLING OPER STRES INCONTINENCE  11/08/2020    STOMACH SURGERY      gastric sleeve     No Known Allergies    Medications:  No outpatient medications have been marked as taking for the 11/20/23 encounter (Office Visit) with Lamarr Lulas, MD.     History   Alcohol Use No     Comment: prior abuse (daily use)     Social History     Tobacco Use   Smoking Status Former    Current packs/day: 1.00    Average packs/day: 1 pack/day for 10.0 years (10.0 ttl pk-yrs)    Types: Cigarettes   Smokeless Tobacco Never   Tobacco Comments    none 01/13/2021     Family History:  Family History   Problem Relation Age of Onset    Alcohol/Drugs Father     Thyroid Disease Sister     Diabetes Paternal Grandmother     Heart Disease Paternal Grandmother      Review of Systems    Review of Systems   Constitutional:  Negative for fever.    HENT:  Positive for congestion (right ear) and ear pain (right ear). Negative for ear discharge, hearing loss, nosebleeds, postnasal drip, sinus pressure, sore throat and trouble swallowing.    Respiratory:  Negative for apnea.    Cardiovascular:  Negative for chest pain.   Gastrointestinal:  Negative for abdominal pain.   Musculoskeletal:  Negative for gait problem.   Skin:  Negative for wound.   Allergic/Immunologic: Negative for environmental allergies and food allergies.   Neurological:  Negative for dizziness and headaches.   Hematological:  Does not bruise/bleed easily.   Psychiatric/Behavioral:  Negative for sleep disturbance.      Physical Examination    General:  The patient is a well nourished female in no acute distress.  Mental Status:  Alert and oriented  Psychiatric: Normal mood and appropriate affect  Respiratory: Chest expanding symmetrically, no stridor, no retraction, Breathing is even and unlabored.  Head: Normocephalic and atraumatic  Cranial Nerves: II-IX, XI,  XII intact, EOMI, PERRLA, FN 1/6 HB bilaterally, hearing is grossly functional, 5/5 shoulder shrug, tongue protrudes midline  Face:  Symmetrically Activating, no worrisome skin lesions, no sinus tenderness  Voice:  normal  Speech and Communication:  Speaking in English, rate is normal, diadokinesia normal, dysarthria not present  Eyes:  Extra-ocular movement intact, sclerae anicteric  Ears:  External pinna normal in appearance, external auditory canals intact and tympanic membrane intact and clear  Nose:  External nose without deformity, no worrisome skin lesions, mucosa healthy and pink, no crusting, no purulence, septum midline   Oral Cavity: Fair dentition, moist mucosa, no lesions on anterior tongue, good movement of anterior tongue  Oropharynx: no mucosal lesions in vallecula or base of tongue, symmetric palatal rise  Neck:  Supple, no tenderness over thyrohyoid area, no tension at rest and with phonation in thyrohyoid space or  tongue base, no masses, trachea midline  Lymphatics:  No palpable enlarged lymphadenopathy  Larynx: - indirect laryngoscopy with mirror yielded and inadequate view of the hypopharynx and larynx due to limitation of the mirror and the lighting system.    Procedures:  Binocular microscopy-  Using an operating microscope, bilateral ears examined using an ear speculum.  The ear canal was without obstruction.  The tympanic membranes were examined, and noticed to be clear without evidence of fluid or effusion on the left ear.  The right ear.  To have an Vinessa effusion that is present.  No evidence of redness or infection.  No retraction    Impression/Summary:  This is a 57 year old y.o old female who presents with middle ear effusion.  This is what is likely creating ear pressure and fullness.  We did discuss that this may improve with time as well as a steroid.  If it does not improve then we would suggest an audio exam and considerations of tube placement in the office.    Plan/Recommendations:  The above findings have been discussed with the patient at length.   She does appear to understand.  The patient will follow up with failure to improve with audio    An After Visit Summary was printed and given to the patient.    Electronically signed by Lamarr Lulas, MD at 11/20/2023  3:16 PM EST

## 2023-11-27 ENCOUNTER — Ambulatory Visit: Payer: PRIVATE HEALTH INSURANCE

## 2023-11-29 ENCOUNTER — Ambulatory Visit: Admit: 2023-11-29 | Discharge: 2023-11-29 | Payer: PRIVATE HEALTH INSURANCE

## 2023-11-29 ENCOUNTER — Ambulatory Visit: Admit: 2023-11-30 | Discharge: 2023-11-30 | Payer: PRIVATE HEALTH INSURANCE

## 2023-11-29 DIAGNOSIS — N898 Other specified noninflammatory disorders of vagina: Secondary | ICD-10-CM

## 2023-11-29 DIAGNOSIS — R102 Pelvic and perineal pain: Secondary | ICD-10-CM

## 2023-11-29 NOTE — Progress Notes (Signed)
Gynecology Annual Exam    HPI:  57 y.o. [phone] postmenopausal female who presents for annual exam. Concerns include sister who is currently being treated for ovarian cancer with metastasis. Pt is having some R-sided pelvic pain starting 1-59mos ago which is what motivated pt to come to the office. A pelvic US 18mos ago showed 2 small ovarian cyst and a nl ems. +Trich a year ago.    No bleeding since menopause. No HRT  No hot flashes, night sweats or vaginal dryness.    Sexually active. No trouble with intercourse.   No loss of bladder/bowel function   No breast concerns    Preventive History:  Last pap: 2022, prior 2017 nl  Last mammogram: 08/2023 nl  Last colonoscopy: has been ordered, pt hasn't done  Last DEXA: na  Immunizations: up to date    OB History   Gravida Para Term Preterm AB Living   6 4 4   2 4    SAB IAB Ectopic Multiple Live Births   2       4      # Outcome Date GA Lbr Len/2nd Weight Sex Type Anes PTL Lv   6 Term 08/22/05 [redacted]w[redacted]d  4054 g (8 lb 15 oz) M Vag-Spont  N LIV   5 Term 02/20/96 [redacted]w[redacted]d  4252 g (9 lb 6 oz) F Vag-Spont  N LIV   4 Term 10/04/92 [redacted]w[redacted]d  4252 g (9 lb 6 oz) F Vag-Spont  N LIV   3 Term 06/15/90 [redacted]w[redacted]d  3714 g (8 lb 3 oz) M Vag-Spont  N LIV   2 SAB  [redacted]w[redacted]d    SAB      1 SAB  [redacted]w[redacted]d    SAB            Past Medical History:   Diagnosis Date    Abnormal Pap smear of cervix     Alcoholism (CMS-HCC)     Anxiety     STD (sexually transmitted disease)     trch, GC, HSV    Wears glasses     readers       Past Surgical History:   Procedure Laterality Date    AUGMENTATION BREAST ENDOSCOPIC      CYSTOSCOPY N/A 11/08/2020    Procedure: CYSTOSCOPY;  Surgeon: Lamona Curl, MD;  Location: [facility] OR;  Service: Gynecology;  Laterality: N/A;    ENDOMETRIAL ABLATION      gastric sleeve bariatric surgeyr by dr Clementeen Graham      REDUCTION BREAST  01/18/2021    SUSPENSION BLADDER NECK TRANS-VAGINAL SLING N/A 11/08/2020    Procedure: PERINEORRHAPHY, TRANS-VAGINAL TAPE;;  Surgeon: Lamona Curl, MD;  Location: [facility] OR;  Service:  Gynecology;  Laterality: N/A;    tummy tuck  04/16/2021    VULVA SURGERY N/A 11/08/2020    Procedure: LABIAPLASTY;  Surgeon: Lamona Curl, MD;  Location: [facility] OR;  Service: Gynecology;  Laterality: N/A;       Social History     Socioeconomic History    Marital status: Married     Spouse name: Not on file    Number of children: Not on file    Years of education: Not on file    Highest education level: Not on file   Occupational History    Not on file   Tobacco Use    Smoking status: Every Day     Current packs/day: 0.00     Types: Cigarettes     Last attempt to quit: 11/07/1992  Years since quitting: 31.0    Smokeless tobacco: Never   Vaping Use    Vaping status: Never Used   Substance and Sexual Activity    Alcohol use: Yes    Drug use: Yes     Types: Medical Marijuana    Sexual activity: Yes     Partners: Male   Other Topics Concern    Caffeine Use Yes    Occupational Exposure No    Exercise Yes     Comment: 2 -3 times a week riding bikes and walking    Seat Belt Yes   Social History Narrative    Not on file     Social Drivers of Health     Financial Resource Strain: Not on file   Food Insecurity: No Food Insecurity (10/29/2023)    Yearly Questionnaire     Do you need any assistance with obtaining housing, meals, medication, transportation or medical equipment?: No     Assistance needed for:: Not on file   Transportation Needs: No Transportation Needs (10/29/2023)    Yearly Questionnaire     Do you need any assistance with obtaining housing, meals, medication, transportation or medical equipment?: No     Assistance needed for:: Not on file   Physical Activity: Not on file   Stress: Not on file   Social Connections: Not on file   Intimate Partner Violence: Unknown (01/13/2023)    Received from [facility] and [employer], [employer] and [employer]    Interpersonal Safety     Feel physically or emotionally unsafe where currently live: Not on file     Harm by anyone: Not on file     Emotionally  Harmed: Not on file   Housing Stability: Low Risk  (10/29/2023)    Yearly Questionnaire     Do you need any assistance with obtaining housing, meals, medication, transportation or medical equipment?: No     Assistance needed for:: Not on file       Family History   Problem Relation Age of Onset    Cancer Mother         Cervical    Stroke Mother 45        05/13/22    Heart disease Mother         mi at age high 65    Cancer Sister 66        Ovarian Cancer    Migraines Sister     Diabetes Paternal Grandmother     Heart disease Paternal Grandmother     Miscarriages / Stillbirths Paternal Grandmother        Medications: reviewed medication list in the chart  Allergies: reviewed allergy section in the chart    Review of Systems - negative except per HPI    Physical Exam:  BP 130/80   Pulse 89   Ht 5' 9 (1.753 m)   Wt 187 lb (84.8 kg)   SpO2 99%   BMI 27.62 kg/m     OBJECTIVE:   Gen: AOx3, NAD.   HEENT: Neck supple. No thyromegaly or supraclavicular/cervical lymphadenopathy  Lungs: No increased work of breathing  Abd: soft, mildly tend to deep palpation RLQ, non distended. No guarding, rebound, hernias, masses, or hepatosplenomegaly  Ext: No LE edema, non tender  Neuro: grossly normal, no focal findings.    BREAST EXAM: breasts appear normal, no suspicious masses, no skin or nipple changes or axillary nodes. Well-healed scars from breast augmentation  PELVIC EXAM: Normal external genitalia  without lesions, including bartholins and skenes glands and urethra. Pink vaginal mucosa with thick mucusy discharge. Cervix grossly normal in appearance. On bimanual exam, 8wk uterus, no CMT or adnexal masses. Mild tend RLQ. Normal appearing anus.   No inguinal lymphadenopathy bilaterally.     A/P:  57 y.o. postmenopausal female for annual exam. Pt reports sister neg for BRCA. Nevertheless I will refer to genetics counseling in light of ov cancer in her sister, order might end up being cancelled. Pelvic US due to pain x 2mos, h/o  ov cysts, RTC after [place].     Swabs for vag d/c with h/o trich, will treat based on result.

## 2023-11-29 NOTE — Progress Notes (Signed)
 I was present throughout and served as a chaperone for the intimate part of this patient's examination.

## 2023-11-30 LAB — VAGINITIS PANEL DNA AMPLIFIED PROBE
Bacterial Vaginosis: POSITIVE — AB
Candida Species: NOT DETECTED
Candida glabrata/krusei: NOT DETECTED
Trichomonas vaginalis: NOT DETECTED

## 2023-11-30 LAB — CHLAMYDIA / GONORRHOEAE DNA SWAB
Chlamydia Trachomatis DNA Swab: NEGATIVE
Neisseria gonorrhoeae DNA Swab: POSITIVE — AB

## 2023-12-02 MED ORDER — metroNIDAZOLE (FLAGYL) 500 MG tablet
500 | ORAL_TABLET | Freq: Two times a day (BID) | ORAL | 0 refills | Status: AC
Start: 2023-12-02 — End: 2023-12-09

## 2023-12-02 MED ORDER — cefTRIAXone (ROCEPHIN) injection 500 mg
1 | Freq: Once | INTRAMUSCULAR | Status: AC
Start: 2023-12-02 — End: 2023-12-03

## 2023-12-02 NOTE — Telephone Encounter (Signed)
-----   Message from Dr. Corie Chiquito sent at 12/02/2023 12:18 PM EST -----  Positive for gonorrhea which is a sexually transmitted infection from a recent sex partner. She needs an injection of ceftriaxone, please schedule RN injection visit. No sex for 2 weeks following treatment. Sex partner also requires treatment. I have placed an order which need to be redone when she comes in. She should continue the other antibiotic for BV

## 2023-12-02 NOTE — Telephone Encounter (Signed)
 [facility] number provided.

## 2023-12-02 NOTE — Telephone Encounter (Signed)
-----   Message from Dr. Corie Chiquito sent at 12/02/2023 12:20 AM EST -----  Positive for bacterial vaginosis which is a very common mild vaginal infection that is not sexually transmitted. Antibiotic escribed, please inform pt

## 2023-12-02 NOTE — Telephone Encounter (Signed)
 Patient is aware of appointment time and date.    [employer]

## 2023-12-03 NOTE — Telephone Encounter (Addendum)
Patient is returning call.  Reviewed results from testing of +BV and +GC.  Discussed that BV is not sexually transmitted.  Reviewed with patient that gonorrhea is an STI, so all partners will need to be treated as well. Advised that all parties should abstain from intercourse for at least 14 days after completion of treatment to avoid reinfection.   Reviewed that virus is treated with IM injection of rocephin.  Appointment made for nurse visit as seen below.  Patient verbalizes understanding and comfort with plan. She denies further questions, needs or concerns at this time.

## 2023-12-04 ENCOUNTER — Inpatient Hospital Stay: Admit: 2023-12-04 | Discharge: 2023-12-08 | Payer: PRIVATE HEALTH INSURANCE

## 2023-12-04 ENCOUNTER — Ambulatory Visit: Admit: 2023-12-04 | Payer: PRIVATE HEALTH INSURANCE

## 2023-12-04 DIAGNOSIS — A549 Gonococcal infection, unspecified: Secondary | ICD-10-CM

## 2023-12-04 DIAGNOSIS — R102 Pelvic and perineal pain: Secondary | ICD-10-CM

## 2023-12-04 MED ORDER — cefTRIAXone (ROCEPHIN) injection 500 mg
1 | Freq: Once | INTRAMUSCULAR | Status: AC
Start: 2023-12-04 — End: 2023-12-04
  Administered 2023-12-04: 21:00:00 via INTRAMUSCULAR

## 2023-12-04 NOTE — Progress Notes (Signed)
Sherry Compton, Patient presents to nurse visit for Ceftriaxone (Rocephin G) injection for treatment of Gonorrhea; pt verified with two patient identifiers. Verified order and plan of care. Pt denies any questions or concerns. Positive gonorrhea test on 11/29/23. Education provided by Dr. Bertrum Sol.    Medication Administration  Area cleansed using alcohol prep pad. Ceftriaxone (Rocephin G) injection ordered, administered in Left Ventrogluteal, and documented in the [facility]. Band-Aid applied. Patient tolerated well. Patient to wait for 15 mins for adverse reaction; no adverse reaction noted. Patient advised to schedule appt for TOC in 3 months.  Understanding voiced.    Discharge Instructions  Reviewed common side effects (tenderness at injection site, GI upset)  Counseled on STI care instructions.  Advised to abstain from intercourse for at least 2 weeks after completing treatment.  Advised that any sexual partners will need to be treated.  Advised on safe sex practices.      Arletha Pili, RN  [facility]  Dr. Bertrum Sol, rendering provider, reviewed and agrees with this care plan.      Ceftriaxone Injection    What is this medication?  CEFTRIAXONE (sef try AX one) treats infections caused by bacteria. It belongs to a group of medications called cephalosporin antibiotics. It will not treat colds, the flu, or infections caused by viruses.  This medicine may be used for other purposes; ask your health care provider or pharmacist if you have questions.  COMMON BRAND NAME(S): Ceftrisol Plus, Rocephin    What should I tell my care team before I take this medication?  They need to know if you have any of these conditions:  Bleeding disorder  High bilirubin level in newborn patients  Kidney disease  Liver disease  Poor nutrition  An unusual or allergic reaction to ceftriaxone, other penicillin or cephalosporin antibiotics, other medicines, foods, dyes, or preservatives  Pregnant or trying to get pregnant  Breast-feeding    How  should I use this medication?  This medication is injected into a vein or into a muscle. It is usually given by a health care provider in a hospital or clinic setting. It may also be given at home.  If you get this medication at home, you will be taught how to prepare and give it. Use exactly as directed. Take it as directed on the prescription label at the same time every day. Take all of this medication unless your care team tells you to stop it early. Keep taking it even if you think you are better.  It is important that you put your used needles and syringes in a special sharps container. Do not put them in a trash can. If you do not have a sharps container, call your care team to get one.  Talk to your care team about the use of this medication in children. While it may be prescribed for children as young as newborns for selected conditions, precautions do apply.  Overdosage: If you think you have taken too much of this medicine contact a poison control center or emergency room at once.  NOTE: This medicine is only for you. Do not share this medicine with others.    What if I miss a dose?  If you get this medication at the hospital or clinic: It is important not to miss your dose. Call your care team if you are unable to keep an appointment.  If you give yourself this medication at home: If you miss a dose, take it as soon as you  can. Then continue your normal schedule. If it is almost time for your next dose, take only that dose. Do not take double or extra doses. Call your care team with questions.    What may interact with this medication?  Birth control pills  Intravenous calcium  This list may not describe all possible interactions. Give your health care provider a list of all the medicines, herbs, non-prescription drugs, or dietary supplements you use. Also tell them if you smoke, drink alcohol, or use illegal drugs. Some items may interact with your medicine.    What should I watch for while using this  medication?  Tell your care team if your symptoms do not start to get better or if they get worse.  Do not treat diarrhea with over the counter products. Contact your care team if you have diarrhea that lasts more than 2 days or if it is severe and watery.  If you have diabetes, you may get a false-positive result for sugar in your urine. Check with your care team.  If you are being treated for a sexually transmitted disease (STD), avoid sexual contact until you have finished your treatment. Your sexual partner may also need treatment.    What side effects may I notice from receiving this medication?  Side effects that you should report to your care team as soon as possible:  Allergic reactions--skin rash, itching, hives, swelling of the face, lips, tongue, or throat  Confusion  Drowsiness  Gallbladder problems--severe stomach pain, nausea, vomiting, fever  Kidney injury--decrease in the amount of urine, swelling of the ankles, hands, or feet  Kidney stones--blood in the urine, pain or trouble passing urine, pain in the lower back or sides  Low red blood cell count--unusual weakness or fatigue, dizziness, headache, trouble breathing  Pancreatitis--severe stomach pain that spreads to your back or gets worse after eating or when touched, fever, nausea, vomiting  Seizures  Severe diarrhea, fever  Unusual weakness or fatigue  Side effects that usually do not require medical attention (report to your care team if they continue or are bothersome):  Diarrhea  This list may not describe all possible side effects. Call your doctor for medical advice about side effects. You may report side effects to FDA at 1-800-FDA-1088.    Where should I keep my medication?  Keep out of the reach of children and pets.  You will be instructed on how to store this medication. Get rid of any unused medication after the expiration date.  To get rid of medications that are no longer needed or have expired:  Take the medication to a medication  take-back program. Check with your pharmacy or law enforcement to find a location.  If you cannot return the medication, ask your care team how to get rid of this medication safely.    NOTE: This sheet is a summary. It may not cover all possible information. If you have questions about this medicine, talk to your doctor, pharmacist, or health care provider.   2022 Elsevier/Gold Standard (2021-01-17 00:00:00)

## 2023-12-05 NOTE — Progress Notes (Signed)
Formatting of this note is different from the original.  Veatrice Kells., MD  [phone] (home) [phone] (Mobile)   Payor: [place] [employer] OR [facility] CARE / Plan: SUREST (FORMERLY BIND BENEFITS) / Product Type: *No Product type* /     Chief Complaint   Patient presents with    Ear Problem     LOV 11/20/23     Visit Type: Follow up    Consulting Provider: OTHER, SELF-REFERRAL    Reason for Visit:   Chief Complaint   Patient presents with    NEW PATIENT VISIT    Ear Problem     Right ear pressure     HPI       Ear Problem     Additional comments: Right ear pressure        Last edited by Tiana Loft, MA on 11/20/2023  2:46 PM.       History of present Illness: Sherry Compton is a 57 year old female who presents at the request of OTHER, SELF-REFERRAL for evaluation and consultation of ear pressure and fullness.  Patient said the symptoms started about 3 weeks ago.  She said she is not sure how she started she does not recall an obvious upper respiratory infection but she feels like there is been persistent pressure and fullness.  She has tried auto insufflation without significant improvement..    She did not improve feel improvement in the right ear with steroid.  She still feels like there is pressure and fullness.    Past Medical History:   Diagnosis Date    Nondependent alcohol abuse     Overweight and obesity      Past Surgical History:   Procedure Laterality Date    BREAST PROSTHESIS, SILICONE      LABIOPLASTY  11/08/2020    MAM DIAG BILAT W IMP      PERINEORRHAPHY  11/08/2020    SLING OPER STRES INCONTINENCE  11/08/2020    STOMACH SURGERY      gastric sleeve     No Known Allergies    Medications:  No outpatient medications have been marked as taking for the 11/20/23 encounter (Office Visit) with Lamarr Lulas, MD.     History   Alcohol Use No     Comment: prior abuse (daily use)     Social History     Tobacco Use   Smoking Status Former    Current packs/day: 1.00    Average packs/day: 1  pack/day for 10.0 years (10.0 ttl pk-yrs)    Types: Cigarettes   Smokeless Tobacco Never   Tobacco Comments    none 01/13/2021     Family History:  Family History   Problem Relation Age of Onset    Alcohol/Drugs Father     Thyroid Disease Sister     Diabetes Paternal Grandmother     Heart Disease Paternal Grandmother      Review of Systems    Review of Systems   Constitutional:  Negative for fever.   HENT:  Positive for congestion (right ear) and ear pain (right ear). Negative for ear discharge, hearing loss, nosebleeds, postnasal drip, sinus pressure, sore throat and trouble swallowing.    Respiratory:  Negative for apnea.    Cardiovascular:  Negative for chest pain.   Gastrointestinal:  Negative for abdominal pain.   Musculoskeletal:  Negative for gait problem.   Skin:  Negative for wound.   Allergic/Immunologic: Negative for environmental allergies and food allergies.   Neurological:  Negative for dizziness and headaches.   Hematological:  Does not bruise/bleed easily.   Psychiatric/Behavioral:  Negative for sleep disturbance.      Physical Examination    General:  The patient is a well nourished female in no acute distress.  Mental Status:  Alert and oriented  Psychiatric: Normal mood and appropriate affect  Respiratory: Chest expanding symmetrically, no stridor, no retraction, Breathing is even and unlabored.  Head: Normocephalic and atraumatic  Cranial Nerves: II-IX, XI, XII intact, EOMI, PERRLA, FN 1/6 HB bilaterally, hearing is grossly functional, 5/5 shoulder shrug, tongue protrudes midline  Face:  Symmetrically Activating, no worrisome skin lesions, no sinus tenderness  Voice:  normal  Speech and Communication:  Speaking in English, rate is normal, diadokinesia normal, dysarthria not present  Eyes:  Extra-ocular movement intact, sclerae anicteric  Ears:  External pinna normal in appearance, external auditory canals intact and tympanic membrane intact and clear  Nose:  External nose without deformity, no  worrisome skin lesions, mucosa healthy and pink, no crusting, no purulence, septum midline   Oral Cavity: Fair dentition, moist mucosa, no lesions on anterior tongue, good movement of anterior tongue  Oropharynx: no mucosal lesions in vallecula or base of tongue, symmetric palatal rise  Neck:  Supple, no tenderness over thyrohyoid area, no tension at rest and with phonation in thyrohyoid space or tongue base, no masses, trachea midline  Lymphatics:  No palpable enlarged lymphadenopathy  Larynx: - indirect laryngoscopy with mirror yielded and inadequate view of the hypopharynx and larynx due to limitation of the mirror and the lighting system.    Procedures:  Ear Microscopy with myringotomy and tube placement- Benefits, risks, and alternatives discussed with patient.  The right ear examined with microscope.  The external auditory canal was cleared of any debris.  The tympanic membrane was found to have an effusion.  Topical phenol was used to anesthetize the tympanic membrane.  A myringotomy knife was used to make a radial incision in the anterior/inferior aspect of the tympanic membrane.   Thin December fluid was removed using a 3 Fr suction. An Armstrong Grommet tube was placed in the myringotomy incision. There was no troublesome bleeding.  There were no complications.    Impression/Summary:  This is a 57 year old y.o old female who presents with middle ear effusion.  This is what is likely creating ear pressure and fullness.  We did discuss that this may improve with time as well as a steroid.  However with steroids in time it did not help.  She had persistent fluid.  We did discuss placement of ear tube in the office.  We did successfully do this.  We did discuss Dryer precautions for the next 24 hours.  If she does notice drainage that is continuous over the next few days she will let us know otherwise she does not need to be on topical drops.    Plan/Recommendations:  The above findings have been discussed with  the patient at length.   She does appear to understand.  The patient will follow up in 2 weeks    An After Visit Summary was printed and given to the patient.  Electronically signed by Lamarr Lulas, MD at 12/05/2023  3:42 PM EST

## 2023-12-26 ENCOUNTER — Ambulatory Visit
Admit: 2023-12-26 | Payer: PRIVATE HEALTH INSURANCE | Attending: Student in an Organized Health Care Education/Training Program

## 2023-12-26 DIAGNOSIS — H60391 Other infective otitis externa, right ear: Secondary | ICD-10-CM

## 2023-12-26 MED ORDER — fluticasone propionate (FLONASE) 50 mcg/actuation nasal spray
50 | Freq: Every day | NASAL | 1 refills | Status: AC
Start: 2023-12-26 — End: ?

## 2023-12-26 MED ORDER — AMOXicillin-clavulanate (AUGMENTIN) 875-125 mg per tablet
875-125 | ORAL_TABLET | Freq: Two times a day (BID) | ORAL | 0 refills | Status: AC
Start: 2023-12-26 — End: 2024-01-02

## 2023-12-26 NOTE — Progress Notes (Signed)
[facility]  [facility] AT [facility]  [address], [place] [address]  Subjective   Name:  Sherry Compton Date of Birth: [birth year] (58 y.o.)   MRN: [id number]    Date of Service:  12/26/2023      Chief Complaint   Patient presents with    Otalgia     Had a tube put in about 3 weeks ago. Since then it has been hurting, with the pain getting worse and worse.  - Still has lots of drainage, despite doc saying it should only drain for a week or so.  - pain has spread to entire right side of face. She can hear liquid moving around and an occasional popping sound.  - Dizzy spells     History of Present Illness:  Sherry Compton is a(n) 58 y.o. female here today for the following:   Otalgia       Patient presents for right ear pain.    Seen by ENT with a tube placed for middle ear effusion with minimal response to steroids.    Since that time, she has noted continued serous drainage from the ear and worsening pain with swelling of the ear. Ear is tender to touch, unable to lay on that side and having some radiation into the jaw. Denies hearing problem, fevers/chills, cough, nasal congestion or other illness. No pain with chewing and she has been tolerating PO well.    PRN Tylenol/ibuprofen for pain    Of note, blood pressure significantly elevated today, though asymptomatic. No prior dx of HTN. Not checking at home.    Current Outpatient Medications   Medication Sig Dispense Refill    buPROPion HCL (WELLBUTRIN XL) 300 MG 24 hr tablet Take 1 tablet (300 mg total) by mouth daily. 90 tablet 3    cyanocobalamin (VITAMIN B-12) 1000 MCG tablet Take 1 tablet (1,000 mcg total) by mouth daily. 90 tablet 3    naltrexone (DEPADE) 50 mg tablet Take 1 tablet (50 mg total) by mouth daily. Indications: alcoholism 90 tablet 3    valACYclovir (VALTREX) 1000 MG tablet TAKE 2 TABLETS BY MOUTH TWICE DAILY AT START OF COLD SORE SYMPTOMS FOR 1 DAY (Patient taking differently: if needed.  TAKE 2 TABLETS BY MOUTH TWICE DAILY AT START OF COLD SORE SYMPTOMS FOR 1 DAY) 30 tablet 3    AMOXicillin-clavulanate (AUGMENTIN) 875-125 mg per tablet Take 1 tablet by mouth 2 times a day for 7 days. 14 tablet 0    fluticasone propionate (FLONASE) 50 mcg/actuation nasal spray Use 1 spray into each nostril daily. 16 g 1    meloxicam (MOBIC) 15 MG tablet Take 1 tablet (15 mg total) by mouth daily. (Patient not taking: Reported on 12/26/2023) 30 tablet 0     No current facility-administered medications for this visit.      Review of Systems   HENT:  Positive for ear pain.    All other systems reviewed and are negative.          Objective   Vitals:    12/26/23 0918   BP: (!) 166/101   Pulse: 85   SpO2: 97%   Weight: 186 lb 15.2 oz (84.8 kg)     Body mass index is 27.61 kg/m.        Physical Exam  General: in no apparent distress.   HEENT:   - Head: Normocephalic and atraumatic.   - Ears: right ear external swelling  with erythema of the canal. Canal swollen. No effusion behind TM.  - Eyes: Conjunctivae are normal. Anicteric sclerae.  - Nose: Nose normal  - Mouth: MMM, clear posterior oropharynx  Pulmonary/Chest: No respiratory distress on room air. Lungs clear to auscultation bilaterally  CV: RRR, well perfused, peripheral pulses 2+ bil  GI: nontender, nondistended  MSK: gait and station wnl  Neurological: no focal deficits noted  Psychiatric: normal mood and affect. Behavior is normal. Judgment and thought content normal.            Assessment & Plan     Darnisha was seen today for otalgia.    Diagnoses and all orders for this visit:    Other infective acute otitis externa of right ear (Primary)  -     AMOXicillin-clavulanate (AUGMENTIN) 875-125 mg per tablet; Take 1 tablet by mouth 2 times a day for 7 days.  -     fluticasone propionate (FLONASE) 50 mcg/actuation nasal spray; Use 1 spray into each nostril daily.    Elevated blood pressure reading    Will monitor pressures at home and report readings to Dr.  August Saucer  Antibiotics as above, daily flonase for chronic sinusitis  Scheduled Tylenol q6hr with ibuprofen PRN for breakthrough pain    ED and return precautions provided  Follow-up with ENT next week as directed      Return in about 1 week (around 01/02/2024).         Grethel Zenk, DO

## 2024-01-08 ENCOUNTER — Ambulatory Visit: Admit: 2024-01-09 | Payer: PRIVATE HEALTH INSURANCE

## 2024-01-08 ENCOUNTER — Ambulatory Visit: Admit: 2024-01-08 | Discharge: 2024-01-12 | Payer: PRIVATE HEALTH INSURANCE

## 2024-01-08 DIAGNOSIS — A549 Gonococcal infection, unspecified: Secondary | ICD-10-CM

## 2024-01-08 LAB — VAGINITIS PANEL DNA AMPLIFIED PROBE
Bacterial Vaginosis: NEGATIVE
Candida Species: DETECTED — AB
Candida glabrata/krusei: NOT DETECTED
Trichomonas vaginalis: NOT DETECTED

## 2024-01-08 LAB — CHLAMYDIA / GONORRHOEAE DNA SWAB
Chlamydia Trachomatis DNA Swab: NEGATIVE
Neisseria gonorrhoeae DNA Swab: NEGATIVE

## 2024-01-08 NOTE — Progress Notes (Signed)
 Recent +GC, presents for TOC. Also having some mild R mid-abd pain. Recent nl pelvic u/s reviewed w/pt in detail, ovary not visible. She has follow-up scheduled for h/o ov cancer in her sister. Nl pap 2022 visible in care everywhere    Wt 193 lb (87.5 kg)   SpO2 99%   BMI 28.50 kg/m   BP 140/80   Pulse 96   Wt 193 lb (87.5 kg)   SpO2 100%   BMI 28.50 kg/m     Spec: NEFG, no lesions. Vag nl, white d/c, swabs done  BME: uterus small, mobile, NT, no adnexal tend or mass    A/p: Swabs done, RTC for annual gyn, sooner PRN. Mid- abd pain on the R does not seem related to any gyn etiology.

## 2024-01-08 NOTE — Progress Notes (Signed)
 I was present throughout and served as a chaperone for the intimate part of this patient's examination.

## 2024-01-09 MED ORDER — fluconazole (DIFLUCAN) 150 MG tablet
150 | ORAL_TABLET | ORAL | 0 refills | Status: AC
Start: 2024-01-09 — End: 2024-01-16

## 2024-01-10 NOTE — Telephone Encounter (Signed)
-----   Message from Dr. Corie Chiquito sent at 01/09/2024  8:13 PM EST -----  Diflucan escribed for yeast. No STD

## 2024-02-20 ENCOUNTER — Ambulatory Visit: Payer: PRIVATE HEALTH INSURANCE

## 2024-02-20 NOTE — Progress Notes (Deleted)
 [facility]  New Visit Clinic Note      Patient Name: Sherry Compton             Date of Birth: Jun 26, 1966  MRN: [id number]  Date of Visit: 02/20/2024      Sherry Compton was referred by Georgann Housekeeper, MD for genetic counseling due to their {HCP Referral Reason:32625}.  Sherry Compton was seen by [employer] [id number]}, Licensed Dentist, {HCP Student:30897} in the [facility] at the {HCP Location:34397} on 02/20/2024.    {HCP Note Additions:33727}    Clinical History: Sherry Compton is a 58 y.o. female with ***    Subjective   Cancer Surveillance:   Mammogram: ***  Mammography Screening Bilateral incl CAD 09/03/2023    Narrative  Exam: Digital bilateral breast screening mammogram with tomosynthesis and CAD on 09/03/2023 2:45 PM EDT.    Indication: Screening mammography. History of bilateral breast implants in 2021.    Comparison: Multiple prior mammograms.    Breast Density: There are scattered areas of fibroglandular density.    Findings:  There are no suspicious masses, calcifications, or architectural distortions. Bilateral subpectoral silicone implants are in place.    Impression  No mammographic evidence of malignancy.    Recommendations:  Annual screening mammography.    ACR BI-RADS Category: 2 (benign findings)    Approved by Michaelle Birks, DO on 09/05/2023 11:33 AM EDT    I have personally reviewed the images and I agree with this report.    Report Verified by: Waymon Budge, MD at 09/05/2023 11:48 AM EDT     Breast MRI: ***  No results found for this or any previous visit from the past 8760 hours.     Previous breast biopsies: {Yes/No:22760} ***   TAH/BSO:  ***     Colonoscopy: ***  No results found for: HMCOLON, SRCOLONOSCOP, COLOGUARD  No results found for this or any previous visit from the past 8760 hours.     Polyps:  {Yes/No:22760} ***   Upper endoscopy: {Yes/No:22760} ***  No results found for this or any previous visit from the past 8760 hours.         Psychosocial  concerns: ***    Family History by patient report:  Family History   Problem Relation Age of Onset    Cancer Mother         Cervical    Stroke Mother 26        05/13/22    Heart disease Mother         mi at age high 64    Cancer Sister 5        Ovarian Cancer    Migraines Sister     Diabetes Paternal Grandmother     Heart disease Paternal Grandmother     Miscarriages / Stillbirths Paternal Grandmother        No saved pedigree images were found    Ashkenazi Jewish ancestry: ***     Objective   Cancer Risk Assessment and Genetic Testing:   General Information:  We had a detailed discussion surrounding the concepts of hereditary, familial and sporadic cancer. Not all cancers are caused by a hereditary pathogenic variant (harmful genetic change). Only around 5% to 10% of cancers are hereditary, meaning the individual is born with a genetic variant that increases their lifetime risk to develop cancer. An additional 10% to 20% of cancers are familial. Cancers are defined as familial when a close relative also has cancer, suggesting that there  could be a familial increased risk for cancer but no hereditary variant has been identified. Finally, most cancers (roughly 70%) are sporadic. Sporadic cancers are those that do not appear to be linked to an inherited genetic variant and could be caused by many different factors such as sun exposure, smoking, radiation, etc.      Regarding the hereditary forms of cancer, autosomal dominant inheritance was reviewed. To review, we have two copies of almost every gene in our body. One copy of the gene is inherited from our biological mother and one copy is inherited from our biological father. A trait/disease is referred to as dominant when having a mutation (or pathogenic variant) in only one of the two copies of a gene is enough to have the disease or trait. When an individual has a mutation in a gene that is inherited in a dominant manner, all first-degree relatives (parents, full  siblings, children) will have a 50% chance of also having that same mutation and therefore having the same risk for disease.    Testing Criteria:   After reviewing the medical and family histories and risk assessment, we discussed genetic testing options. Specifically, we talked about currently available single-gene and multi-gene panel testing options, including benefits and limitations.    We discussed that there are standard criteria used to assess an individual's risk to carry a genetic variant that increases their risk for hereditary cancer. In general, some of the criteria we are looking for are: cancer diagnoses under age 23 (excluding childhood cancers), multiple people affected with cancer, multiple generations affected with cancer, certain types of cancer seen on the same side of the family (for example breast and ovarian cancer or colon and uterine cancer). When these factors are present, we are more suspicious of hereditary cancer in a family. However, it is also important to note that a hereditary cancer genetic variant can still be present in a family when these factors are not present, or these criteria are not met.     {HCP Criteria:33729}    {NCCN Criteria:34932}    Genetic Testing  {HCP Better Person to Test:34750}    We discussed the possible results of genetic testing including positive, negative or a variant of uncertain significance, and briefly discussed the implications of each. A positive result means that one or more pathogenic variants were identified in the genes that were evaluated on the panel. A negative result means that no genetic changes were identified with this testing. However, a negative genetic testing result does not completely rule out a genetic cause, as there may be a genetic change that was not able to be detected with this level of testing. A variant of uncertain significance (VUS) is a genetic change that was identified, but there is currently not enough information  about this change for Sherry Compton to determine if it is disease-causing or not. The classification of a VUS may change (upgraded to pathogenic or downgraded to benign) in the future as we find out more information about the specific change. It is important to note that changes in clinical management should not be made based on a VUS, nor should unaffected relatives be tested for variants of uncertain significance. However, testing affected family members may be helpful in clarifying the significance of the VUS.    We discussed that if Berneice Leonor's test results reveal and pathogenic or likely pathogenic variant in any of the genes tested we will review specific screening and management recommendations with her. We will discuss  gene specific cancer risks if they are available and review management recommendations which may include: additional cancer imaging, preventative surgical measures, or other risk reducing measures.     Genetic Information Nondiscrimination Act (GINA):  We discussed insurance discrimination after genetic testing.  It was reviewed that there was federal legislation known as The [employer] Non-Discrimination Act (GINA) which went into effect in 2008.  GINA created new protections against the misuse of genetic information by health insurance companies and employers. GINA makes it illegal for health insurance companies to deny coverage or to charge a higher rate or premium to an otherwise healthy individual based on genetic test results or family health history.  GINA applies to group health insurance, individual health insurance and Medicare.  GINA does not apply to long term care insurance, disability insurance, life insurance or insurance for [employer] personnel/V.A. Beneficiaries, the [employer] and [employer] employees who receive care through the [employer].  GINA also makes it illegal for employers to use this information when making employment decisions  such as hiring, firing, promotions, or any other terms of employment.  GINA does not apply to employers with less than 15 employees.            Plan:  {HCP Plan:33728}    Resources provided: Information about {NWG:95621}, including billing information, and my contact information were given to [facility].    I remain available to [facility] and their healthcare providers should they have any questions or concerns. I may be reached in the Division of Human Genetics at {Phone Number:29943}(direct) or [phone].    Sincerely,    {GC Name:29940}, MS, LGC  Licensed Dentist  [facility]      {GC GEXB:28413}, Licensed Dentist, spent *** minutes {HCP GC Roles:34398::preparing to see the patient by reviewing previous testing and documentation,reviewing or updating patient's medical information,obtaining or updating a structured family genetic history,pedigree construction,providing genetic risk assessment and evaluation,counseling and educating the patient and/ or family/ caregiver,ordering genetic testing,independently interpreting results and communicating results to patient and/ or family/ caregiver,psychosocial assessment,referring to and/or communicating with other healthcare professionals,care coordination,documenting clinical information in the electronic medical record}.

## 2024-03-30 ENCOUNTER — Ambulatory Visit: Admit: 2024-03-30 | Payer: PRIVATE HEALTH INSURANCE

## 2024-03-30 ENCOUNTER — Other Ambulatory Visit: Admit: 2024-03-30 | Payer: PRIVATE HEALTH INSURANCE

## 2024-03-30 DIAGNOSIS — N760 Acute vaginitis: Secondary | ICD-10-CM

## 2024-03-30 DIAGNOSIS — Z113 Encounter for screening for infections with a predominantly sexual mode of transmission: Secondary | ICD-10-CM

## 2024-03-30 LAB — CBC
Hematocrit: 42.2 % (ref 35.0–45.0)
Hemoglobin: 14.6 g/dL (ref 11.7–15.5)
MCH: 39 pg — ABNORMAL HIGH (ref 27.0–33.0)
MCHC: 34.5 g/dL (ref 32.0–36.0)
MCV: 113.1 fL — ABNORMAL HIGH (ref 80.0–100.0)
MPV: 7.7 fL (ref 7.5–11.5)
Platelets: 264 10*3/uL (ref 140–400)
RBC: 3.74 10*6/uL — ABNORMAL LOW (ref 3.80–5.10)
RDW: 17.9 % — ABNORMAL HIGH (ref 11.0–15.0)
WBC: 6.3 10*3/uL (ref 3.8–10.8)

## 2024-03-30 LAB — COMPREHENSIVE METABOLIC PANEL, SERUM
ALT: 26 U/L (ref 7–52)
AST (SGOT): 37 U/L (ref 13–39)
Albumin: 4.2 g/dL (ref 3.5–5.7)
Alkaline Phosphatase: 97 U/L (ref 36–125)
Anion Gap: 12 mmol/L (ref 3–16)
BUN: 8 mg/dL (ref 7–25)
CO2: 25 mmol/L (ref 21–33)
Calcium: 9.5 mg/dL (ref 8.6–10.3)
Chloride: 104 mmol/L (ref 98–110)
Creatinine: 0.69 mg/dL (ref 0.60–1.30)
EGFR: >90
Glucose: 99 mg/dL (ref 70–100)
Osmolality, Calculated: 290 mosm/kg (ref 278–305)
Potassium: 4.7 mmol/L (ref 3.5–5.3)
Sodium: 141 mmol/L (ref 133–146)
Total Bilirubin: 0.5 mg/dL (ref 0.0–1.5)
Total Protein: 6.8 g/dL (ref 6.4–8.9)

## 2024-03-30 LAB — PROTIME-INR
INR: 0.9 (ref 0.9–1.1)
Protime: 12.2 s (ref 12.1–15.1)

## 2024-03-30 LAB — VAGINITIS PANEL DNA AMPLIFIED PROBE
Bacterial Vaginosis: POSITIVE — AB
Candida Species: NOT DETECTED
Candida glabrata/krusei: NOT DETECTED
Trichomonas vaginalis: DETECTED — AB

## 2024-03-30 LAB — LIPID PANEL
Cholesterol, Total: 233 mg/dL — ABNORMAL HIGH (ref 0–200)
HDL: 71 mg/dL (ref 60–92)
LDL Cholesterol: 130 mg/dL
Non-HDL Cholesterol, Calculated: 162 mg/dL — ABNORMAL HIGH (ref 0–129)
Triglycerides: 161 mg/dL — ABNORMAL HIGH (ref 10–149)

## 2024-03-30 LAB — HEPATITIS B SURFACE ANTIGEN: Hep B Surface Ag: NONREACTIVE

## 2024-03-30 LAB — HEPATITIS C ANTIBODY: HCV Ab: NONREACTIVE

## 2024-03-30 LAB — HIV 1+2 ANTIBODY/ANTIGEN WITH REFLEX: HIV 1+2 AB/AGN: NONREACTIVE

## 2024-03-30 LAB — VITAMIN B12: Vitamin B-12: 274 pg/mL (ref 180–914)

## 2024-03-30 LAB — HEMOGLOBIN A1C: Hemoglobin A1C: 5 % (ref 4.0–5.6)

## 2024-03-30 LAB — TREPONEMA PALLIDUM AB WITH REFLEX: Treponema Pallidum: NEGATIVE

## 2024-03-30 LAB — FOLATE: Folic Acid: 2.5 ng/mL — ABNORMAL LOW (ref 5.90–24.80)

## 2024-03-30 LAB — CHLAMYDIA / GONORRHOEAE DNA SWAB
Chlamydia Trachomatis DNA Swab: NEGATIVE
Neisseria gonorrhoeae DNA Swab: NEGATIVE

## 2024-03-30 MED ORDER — fluconazole (DIFLUCAN) 150 MG tablet
150 | ORAL_TABLET | Freq: Once | ORAL | 0 refills | Status: AC
Start: 2024-03-30 — End: 2024-03-30

## 2024-03-30 NOTE — Progress Notes (Signed)
 [facility]    Name: Sherry Compton  DOB: July 31, 1966 (58 y.o.)  MRN: [id number]    Date of Service: 03/30/2024      Chief Complaint:     Chief Complaint   Patient presents with    Vaginal Discharge     And itching for 3 days  - no polyuria or pain when urinating  - worried it may be an STD     History of Present Illness:     Sherry Compton is a(n) 58 y.o. female with a past medical history of herpes labialis, hx of chlamydia here today for the following:     Acute Concerns:    Vaginal Discharge and Itching   - Started 3 days ago   - Clear discharge, copious amounts   - Incredibly itchy   - Tried OTC yeast cream which did help mildly but symptoms still persisting.   - Last sexual partner 2 weeks ago, no barrier protection   - Does have hx of chlamydia/gonorrhea and trichomonas in the past   - No associated dysuria   - No fevers, chills, lower abdominal pain, pain with sex     Alcohol Use Disorder   - Wants to have liver function tests done   - In AA, has a sponsor  - Intermittently drinking, bottle of day of wine when she does (for 1 week at a time, then stops for a couple of weeks, then will restart again)   - Wishes to set up further appointment to discuss alcohol use     The patient's past medical history, medications, allergies, social and family history were reviewed and updated if necessary as part of today's visit.            ROS:     Per HPI       Past Medical History:     Past Medical History:   Diagnosis Date    Abnormal Pap smear of cervix     Alcoholism (CMS-HCC)     Anxiety     STD (sexually transmitted disease)     trch, GC, HSV    Wears glasses     readers       Past Surgical History:   Procedure Laterality Date    AUGMENTATION BREAST ENDOSCOPIC      CYSTOSCOPY N/A 11/08/2020    Procedure: CYSTOSCOPY;  Surgeon: Lamona Curl, MD;  Location: [facility] OR;  Service: Gynecology;  Laterality: N/A;    ENDOMETRIAL ABLATION      gastric sleeve bariatric surgeyr by dr Clementeen Graham      REDUCTION BREAST   01/18/2021    SUSPENSION BLADDER NECK TRANS-VAGINAL SLING N/A 11/08/2020    Procedure: PERINEORRHAPHY, TRANS-VAGINAL TAPE;;  Surgeon: Lamona Curl, MD;  Location: [facility] OR;  Service: Gynecology;  Laterality: N/A;    tummy tuck  04/16/2021    VULVA SURGERY N/A 11/08/2020    Procedure: LABIAPLASTY;  Surgeon: Lamona Curl, MD;  Location: [facility] OR;  Service: Gynecology;  Laterality: N/A;       Social History     Socioeconomic History    Marital status: Married     Spouse name: Not on file    Number of children: Not on file    Years of education: Not on file    Highest education level: Not on file   Occupational History    Not on file   Tobacco Use    Smoking status: Every Day     Current packs/day: 0.00  Types: Cigarettes     Last attempt to quit: 11/07/1992     Years since quitting: 31.4    Smokeless tobacco: Never   Vaping Use    Vaping status: Never Used   Substance and Sexual Activity    Alcohol use: Yes    Drug use: Yes     Types: Medical Marijuana    Sexual activity: Yes     Partners: Male   Other Topics Concern    Caffeine Use Yes    Occupational Exposure No    Exercise Yes     Comment: 2 -3 times a week riding bikes and walking    Seat Belt Yes   Social History Narrative    Not on file     Social Drivers of Health     Financial Resource Strain: Not on file   Food Insecurity: No Food Insecurity (10/29/2023)    Yearly Questionnaire     Do you need any assistance with obtaining housing, meals, medication, transportation or medical equipment?: No     Assistance needed for:: Not on file   Transportation Needs: No Transportation Needs (10/29/2023)    Yearly Questionnaire     Do you need any assistance with obtaining housing, meals, medication, transportation or medical equipment?: No     Assistance needed for:: Not on file   Physical Activity: Not on file   Stress: Not on file   Social Connections: Not on file   Intimate Partner Violence: Unknown (01/13/2023)    Received from [facility] and [employer], [employer]  and [employer]    Interpersonal Safety     Feel physically or emotionally unsafe where currently live: Not on file     Harm by anyone: Not on file     Emotionally Harmed: Not on file   Housing Stability: Low Risk  (10/29/2023)    Yearly Questionnaire     Do you need any assistance with obtaining housing, meals, medication, transportation or medical equipment?: No     Assistance needed for:: Not on file       Current Medications as of 03/30/2024  2:43 PM       Outpatient Medications         Quantity Refills Start End    buPROPion HCL (WELLBUTRIN XL) 300 MG 24 hr tablet 90 tablet 3 07/19/2023 --    cyanocobalamin (VITAMIN B-12) 1000 MCG tablet 90 tablet 3 07/19/2023 --    fluticasone propionate (FLONASE) 50 mcg/actuation nasal spray 16 g 1 12/26/2023 --    meloxicam (MOBIC) 15 MG tablet 30 tablet 0 02/11/2023 --    naltrexone (DEPADE) 50 mg tablet 90 tablet 3 07/19/2023 --    valACYclovir (VALTREX) 1000 MG tablet 30 tablet 3 07/19/2023 --                    Allergies[1]        Objective:   BP 130/87 (BP Location: Left upper arm, Patient Position: Sitting, BP Cuff Size: Large)   Pulse 87   Wt 197 lb 5 oz (89.5 kg)   SpO2 97%   BMI 29.14 kg/m     Physical Exam  Exam conducted with a chaperone present.   Constitutional:       Appearance: She is normal weight.   Cardiovascular:      Rate and Rhythm: Normal rate and regular rhythm.   Pulmonary:      Effort: Pulmonary effort is normal.   Genitourinary:  General: Normal vulva.      Pubic Area: No rash.       Comments: Thick yellow discharge from cervical os. No adnexal tenderness or cervical motion tenderness   Musculoskeletal:      Right lower leg: No edema.      Left lower leg: No edema.   Neurological:      General: No focal deficit present.      Mental Status: She is alert and oriented to person, place, and time.         Wt Readings from Last 3 Encounters:   03/30/24 197 lb 5 oz (89.5 kg)   01/08/24 193 lb (87.5 kg)   12/26/23 186 lb 15.2 oz (84.8 kg)      Assessment/Plan:     Sherry Compton is a(n) 58 y.o. female here today most likely presenting with vaginitis.     Acute vaginitis  Vaginal Discharge   Routine screening for STI (sexually transmitted infection)  Differential diagnosis includes candidal infection, BV, gonorrhea, chlamydia, trichomonas. On exam, patient with thick yellow vaginal discharge from cervical os without cervical motion/adnexal tenderness. Given unprotected sexual intercourse, will test for G/C, vaginitis panel, and screen for other sexually transmitted diseases. Given that patient had partial response to OTC yeast cream, will prescribe fluconazole for presumed yeast infection.   -     Vaginitis Panel DNA Amplified Probe; Future  -     Chlamydia / Gonorrhoeae DNA Swab; Future  -     Syphilis Screening Daune Perch); Future  -     HIV 1+2 Antibody/Antigen with Reflex; Future  -     Hepatitis B surface antigen; Future  -     Hepatitis C Antibody; Future  -   fluconazole (DIFLUCAN) 150 MG tablet; Take 1 tablet (150 mg total) by mouth once for 1 dose. If no response in 72 hours, take second dose.    History of Alcohol abuse  Patient previously sober for 25 years, has recently started drinking again in the setting of her children being out of the house and her husband retiring soon. Follows with AA, has sponsor, still intermittently drinking. Drink of choice is wine, previously tequila. Wants to have labs checked, will follow-up with me in clinic to discuss alcohol use further. Also ordered other yearly labs since patient getting lab work done.   -     Comprehensive Metabolic Panel, Serum; Future  -     Protime-INR; Future  -     CBC; Future  -     Hemoglobin A1c; Future  -     Lipid Profile; Future    Dorothe Pea, MD  She/her/hers  PGY-1  Family Medicine         [1] No Known Drug Allergies or Adverse Reactions

## 2024-03-31 MED ORDER — metroNIDAZOLE (FLAGYL) 500 MG tablet
500 | ORAL_TABLET | Freq: Three times a day (TID) | ORAL | 0 refills | Status: AC
Start: 2024-03-31 — End: ?

## 2024-04-02 MED ORDER — folic acid (FOLVITE) 1 MG tablet
1 | ORAL_TABLET | Freq: Every day | ORAL | 2 refills | Status: AC
Start: 2024-04-02 — End: ?

## 2024-05-06 MED ORDER — fluticasone propionate (FLONASE) 50 mcg/actuation nasal spray
50 | Freq: Every day | NASAL | 1 refills | 30.00000 days | Status: AC
Start: 2024-05-06 — End: ?

## 2024-07-22 MED ORDER — buPROPion HCL (WELLBUTRIN XL) 300 MG 24 hr tablet
300 | ORAL_TABLET | Freq: Every day | ORAL | 0 refills | 30.00000 days | Status: AC
Start: 2024-07-22 — End: 2024-09-01

## 2024-08-25 MED ORDER — cyanocobalamin (VITAMIN B-12) 1000 MCG tablet
1000 | ORAL_TABLET | Freq: Every day | ORAL | 3 refills | 30.00000 days | Status: AC
Start: 2024-08-25 — End: 2024-09-01

## 2024-08-25 MED ORDER — naltrexone (DEPADE) 50 mg tablet
50 | ORAL_TABLET | Freq: Every day | ORAL | 3 refills | 30.00000 days | Status: AC
Start: 2024-08-25 — End: 2024-09-01

## 2024-08-25 NOTE — Telephone Encounter (Signed)
 Rx Refill Request  Patient's PCP: HECTOR GORMAN NERI   Last office visit with encounter provider: 02/11/2023 HECTOR GORMAN NERI, MD  Last office visit at this office and with whom: 03/30/2024 Tinnie Gaunt, MD  Next scheduled appointment: Visit date not found    Wellness Visit   Topic Date Due    Physical visit  Never done     Follow-up Instruction (From Last 3 Office Visits)              03/30/2024 Return in about 1 month (around 04/29/2024) for To discuss alcohol use .      Tinnie Gaunt, MD    10/29/2023 No Follow-up on file.    Dale Bleak, DO    07/25/2022 No Follow-up on file.    Morley Lars, DO            Clinician Assessment of Risk (since 09/12/1952)       None          Lab Results   Component Value Date    HGBA1C 5.0 03/30/2024    HGBA1C 4.9 08/21/2022    HGBA1C 5.1 12/07/2015     Lab Results   Component Value Date    TSH 0.55 04/22/2023    FREET4 0.85 04/22/2023     BP Readings from Last 3 Encounters:   03/30/24 130/87   01/08/24 140/80   12/26/23 (!) 166/101     Health Maintenance Due   Topic Date Due    Tobacco Cessation Readiness  Never done    ASCVD Assessment  Never done    Comprehensive Physical Exam  Never done    Immunization: Hepatitis B (1 of 3 - 19+ 3-dose series) Never done    Osteoporosis Screening (DXA Scan)  Never done    Lung Cancer Screening  Never done    Alcohol Misuse Screening  07/26/2023    Cervical Cancer Screening/Pap Smear (MyChart)  11/15/2023    Immunization: Influenza (MyChart) (1) 08/24/2024       Lab Results   Component Value Date    CHOLTOT 233 (H) 03/30/2024    TRIG 161 (H) 03/30/2024    HDL 71 03/30/2024    LDL 130 03/30/2024       [facility] ED/ Hospital Admissions last 365 days:  Total [facility] ED Count: 0     Total [facility] Hospital Admission Count: 0

## 2024-08-26 NOTE — Telephone Encounter (Signed)
 Pt has been scheduled.

## 2024-09-01 ENCOUNTER — Ambulatory Visit: Admit: 2024-09-01 | Discharge: 2024-09-07 | Payer: PRIVATE HEALTH INSURANCE

## 2024-09-01 MED ORDER — naltrexone (DEPADE) 50 mg tablet
50 | ORAL_TABLET | Freq: Every day | ORAL | 3 refills | 30.00000 days | Status: AC
Start: 2024-09-01 — End: ?

## 2024-09-01 MED ORDER — buPROPion HCL (WELLBUTRIN XL) 300 MG 24 hr tablet
300 | ORAL_TABLET | Freq: Every day | ORAL | 0 refills | 30.00000 days | Status: AC
Start: 2024-09-01 — End: ?

## 2024-09-01 MED ORDER — cyanocobalamin (VITAMIN B-12) 1000 MCG tablet
1000 | ORAL_TABLET | Freq: Every day | ORAL | 3 refills | 30.00000 days | Status: AC
Start: 2024-09-01 — End: ?

## 2024-09-01 NOTE — Progress Notes (Signed)
 Based off of a review of the patient's medical/smoking history, she does not currently qualify for the Lung Cancer Screening Program.  Patient reported she stopped smoking at age 58 (33 years ago) and her pack years equate to 23.    Current insurance guidelines indicate that a patient must be between the ages of 33-80 and smoked at least one pack of cigarettes per day for 20 years or more (Note: Some insurance does not provide coverage after the age of 61). Additionally, former smokers must have quit within the last 15 years in order to qualify for lung cancer screening.

## 2024-09-01 NOTE — Progress Notes (Signed)
 [facility]  [facility]  [address], [place] [address]  Subjective   Name:  Sherry Compton Date of Birth: [birth year] (58 y.o.)   MRN: [id number]    Date of Service:  09/01/2024      Chief Complaint   Patient presents with    Annual Exam    Medication Refill     History of Present Illness:  Sherry Compton  is a(n) 58 y.o. female here today for the following:   HPI  Chief Complaint   Patient presents with    Annual Exam    Medication Refill     History of Present Illness  Sherry Compton is a 58 year old female who presents for a routine follow-up visit.    She has gained approximately twenty pounds and is uncertain about her diet. She has recently started walking two to three miles a day. She reports good sleep, getting nine to ten hours per night, and feels rested in the morning without snoring or breathing issues.    She smokes about five cigarettes a day and has been smoking for the past seven years after a 25-year hiatus. She started smoking as a teenager and smoked for about ten years before quitting. She has recently joined a support group to quit smoking.    She has a history of alcohol use with periods of abstinence and relapse. Her last drink was about 25 days ago, and she describes her drinking pattern as binge drinking. She is currently involved in Alcoholics Anonymous and a women's support group online, and she feels positive about her progress. She takes Wellbutrin  and naltrexone  daily, which help with her mood and cravings.    No recent fractures or bone density issues, although she had a sore shoulder or arm a few years ago that resolved without intervention. She has not had a lung cancer screening before.    Her vision is worsening, and she has a prescription for glasses but has not filled it, opting to use over-the-counter readers instead. No headaches, dizziness, chest pain, shortness of breath, or urinary issues.      Current  Outpatient Medications   Medication Sig Dispense Refill    buPROPion  HCL (WELLBUTRIN  XL) 300 MG 24 hr tablet Take 1 tablet (300 mg total) by mouth daily. 90 tablet 0    cyanocobalamin  (VITAMIN B-12) 1000 MCG tablet TAKE 1 TABLET BY MOUTH ONCE DAILY 90 tablet 3    folic acid  (FOLVITE ) 1 MG tablet Take 1 tablet (1 mg total) by mouth daily. 90 tablet 2    naltrexone  (DEPADE) 50 mg tablet Take 1 tablet (50 mg total) by mouth daily. Indications: alcoholism 90 tablet 3    valACYclovir  (VALTREX ) 1000 MG tablet TAKE 2 TABLETS BY MOUTH TWICE DAILY AT START OF COLD SORE SYMPTOMS FOR 1 DAY 30 tablet 3    fluticasone  propionate (FLONASE ) 50 mcg/actuation nasal spray SHAKE LIQUID AND USE 1 SPRAY IN EACH NOSTRIL DAILY (Patient not taking: Reported on 09/01/2024) 16 g 1    meloxicam  (MOBIC ) 15 MG tablet Take 1 tablet (15 mg total) by mouth daily. (Patient not taking: Reported on 09/01/2024) 30 tablet 0    metroNIDAZOLE  (FLAGYL ) 500 MG tablet Take 0.5 tablets (250 mg total) by mouth 3 times a day. Indications: trichomoniasis (Patient not taking: Reported on 09/01/2024) 10 tablet 0     No current facility-administered medications for this visit.  Review of Systems   Constitutional:  Negative for activity change, appetite change and fatigue.   Respiratory:  Negative for chest tightness and shortness of breath.    Cardiovascular:  Negative for leg swelling.   Gastrointestinal:  Negative for abdominal pain.   Neurological:  Negative for dizziness.   Psychiatric/Behavioral:  Negative for depression and sleep disturbance. The patient is not nervous/anxious.            Objective   Vitals:    09/01/24 1133   BP: 134/87   Pulse: 73   Temp: 98.4 F (36.9 C)   SpO2: 98%   Height: 5' 9 (1.753 m)   Weight: 186 lb 11.7 oz (84.7 kg)   BMI (Calculated): 27.56     Body mass index is 27.58 kg/m.        Physical Exam  Constitutional:       Appearance: Normal appearance.   Cardiovascular:      Rate and Rhythm: Normal rate and regular rhythm.       Pulses: Normal pulses.      Heart sounds: Normal heart sounds.   Pulmonary:      Effort: Pulmonary effort is normal.      Breath sounds: Normal breath sounds.   Abdominal:      Palpations: Abdomen is soft.   Neurological:      General: No focal deficit present.      Mental Status: She is alert and oriented to person, place, and time.   Psychiatric:         Mood and Affect: Mood normal.         Behavior: Behavior normal.         Thought Content: Thought content normal.         Judgment: Judgment normal.            Lab Results   Component Value Date    WBC 6.3 03/30/2024    HGB 14.6 03/30/2024    HCT 42.2 03/30/2024    MCV 113.1 (H) 03/30/2024    PLT 264 03/30/2024     Lab Results   Component Value Date    HGBA1C 5.0 03/30/2024     Lab Results   Component Value Date    TSH 0.55 04/22/2023     Lab Results   Component Value Date    VITD25H 25.7 (L) 04/22/2023     Lab Results   Component Value Date    GLUCOSE 99 03/30/2024    BUN 8 03/30/2024    CREATININE 0.69 03/30/2024    K 4.7 03/30/2024    ALBUMIN 4.2 03/30/2024    EGFR >90 03/30/2024     Lab Results   Component Value Date    ALT 26 03/30/2024    AST 13 04/22/2023    ALKPHOS 97 03/30/2024    BILITOT 0.5 03/30/2024     Lab Results   Component Value Date    VITD25H 25.7 (L) 04/22/2023     Lab Results   Component Value Date    VITAMINB12 274 03/30/2024     Lab Results   Component Value Date    CHOLTOT 233 (H) 03/30/2024    TRIG 161 (H) 03/30/2024    HDL 71 03/30/2024    LDL 130 03/30/2024           Assessment & Plan   ASCVD Risk Assessment  The 10-year ASCVD risk score (Arnett DK, et al., 2019) is: 6.1%    Values used to calculate  the score:      Age: 58 years      Sex: Female      Is Non-Hispanic African American: No      Diabetic: No      Tobacco smoker: Yes      Systolic Blood Pressure: 134 mmHg      Is BP treated: No      HDL Cholesterol: 71 mg/dL      Total Cholesterol: 233 mg/dL    I spent between 4-84 minutes in assessing for atherosclerotic cardiovascular  disease (ASCVD) risk.              Assessment & Plan  Alcohol use disorder, in remission    Alcohol use disorder is not in remission, as the patient drank alcohol 25 days ago and reports a binge drinking pattern. She is engaged in naltrexone  therapy and support groups, showing a strong commitment to sobriety. Continue naltrexone  daily and participation in AA and women's support groups. Attend the planned retreat in [place]  from October 5th to 10th. Refill naltrexone  prescription.    Nicotine dependence, current    She has a history of smoking and is currently dependent on nicotine. Actively attempting cessation and aware of potential lung damage. Refer to lung cancer screening to assess eligibility and provide contact information. Encourage continued participation in the smoking cessation support group.    Major depressive disorder, in remission with ongoing pharmacotherapy    Major depressive disorder is in remission with Wellbutrin . She reports no depression or anxiety. Continue Wellbutrin  and B12 supplementation. Refill prescriptions for both.    Smoker not ready to quit  General Health Maintenance    She is due for a Pap smear, declined the flu shot, and has not had a recent bone density test. Uses OTC readers but has not filled her glasses prescription. Schedule Pap smear and discuss the flu shot at the next visit. Consider bone density screening if indicated and encourage filling the prescription for glasses.                        HECTOR GORMAN NERI, MD

## 2024-09-18 ENCOUNTER — Other Ambulatory Visit: Admit: 2024-09-18 | Payer: PRIVATE HEALTH INSURANCE

## 2024-09-18 DIAGNOSIS — Z0000 Encounter for general adult medical examination without abnormal findings: Secondary | ICD-10-CM

## 2024-09-18 LAB — CBC
Hematocrit: 42.9 % (ref 35.0–45.0)
Hemoglobin: 14.7 g/dL (ref 11.7–15.5)
MCH: 37.9 pg — ABNORMAL HIGH (ref 27.0–33.0)
MCHC: 34.4 g/dL (ref 32.0–36.0)
MCV: 110.3 fL — ABNORMAL HIGH (ref 80.0–100.0)
MPV: 8.8 fL (ref 7.5–11.5)
Platelets: 222 10E3/uL (ref 140–400)
RBC: 3.89 10E6/uL (ref 3.80–5.10)
RDW: 16 % — ABNORMAL HIGH (ref 11.0–15.0)
WBC: 4.7 10E3/uL (ref 3.8–10.8)

## 2024-09-18 LAB — LIPID PANEL
Cholesterol, Total: 216 mg/dL — ABNORMAL HIGH (ref 0–200)
HDL: 56 mg/dL — ABNORMAL LOW (ref 60–92)
LDL Cholesterol: 136 mg/dL
Non-HDL Cholesterol, Calculated: 160 mg/dL — ABNORMAL HIGH (ref 0–129)
Triglycerides: 119 mg/dL (ref 10–149)

## 2024-09-18 LAB — COMPREHENSIVE METABOLIC PANEL
ALT: 25 U/L (ref 7–52)
AST: 27 U/L (ref 13–39)
Albumin: 4 g/dL (ref 3.5–5.7)
Alkaline Phosphatase: 60 U/L (ref 36–125)
Anion Gap: 7 mmol/L (ref 3–16)
BUN: 10 mg/dL (ref 7–25)
CO2: 27 mmol/L (ref 21–33)
Calcium: 9.5 mg/dL (ref 8.6–10.3)
Chloride: 109 mmol/L (ref 98–110)
Creatinine: 0.78 mg/dL (ref 0.60–1.30)
EGFR: 88
Glucose: 88 mg/dL (ref 70–100)
Osmolality, Calculated: 294 mosm/kg (ref 278–305)
Potassium: 4.7 mmol/L (ref 3.5–5.3)
Sodium: 143 mmol/L (ref 133–146)
Total Bilirubin: 0.5 mg/dL (ref 0.0–1.5)
Total Protein: 6.8 g/dL (ref 6.4–8.9)

## 2024-09-18 LAB — IRON STUDIES
% Iron Saturation: 39.1 % (ref 15.0–55.0)
Iron: 113 ug/dL (ref 50–212)
TIBC: 289 ug/dL (ref 265–497)

## 2024-09-18 LAB — VITAMIN D 25 HYDROXY: Vit D, 25-Hydroxy: 52.6 ng/mL (ref 30.0–100.0)

## 2024-09-18 LAB — HEMOGLOBIN A1C: Hemoglobin A1C: 4.9 % (ref 4.0–5.6)

## 2024-09-18 LAB — FERRITIN: Ferritin: 166.6 ng/mL (ref 11.0–306.8)

## 2024-09-18 LAB — VITAMIN B12: Vitamin B-12: 467 pg/mL (ref 180–914)

## 2024-09-18 LAB — FOLATE: Folic Acid: >24.8 ng/mL — ABNORMAL HIGH (ref 5.90–24.80)

## 2024-09-18 LAB — TSH: TSH: 3.36 u[IU]/mL (ref 0.45–4.12)

## 2024-09-27 NOTE — Progress Notes (Signed)
 Are you taking vit b12 daily?  How much alcohol are you presently consuming?

## 2024-12-08 ENCOUNTER — Encounter

## 2025-01-07 ENCOUNTER — Other Ambulatory Visit: Admit: 2025-01-07 | Payer: PRIVATE HEALTH INSURANCE

## 2025-01-07 DIAGNOSIS — L659 Nonscarring hair loss, unspecified: Principal | ICD-10-CM

## 2025-01-07 LAB — CBC
Hematocrit: 43.6 % (ref 35.0–45.0)
Hemoglobin: 15 g/dL (ref 11.7–15.5)
MCH: 34.2 pg — ABNORMAL HIGH (ref 27.0–33.0)
MCHC: 34.4 g/dL (ref 32.0–36.0)
MCV: 99.5 fL (ref 80.0–100.0)
MPV: 8.5 fL (ref 7.5–11.5)
Platelets: 217 10E3/uL (ref 140–400)
RBC: 4.39 10E6/uL (ref 3.80–5.10)
RDW: 15.2 % — ABNORMAL HIGH (ref 11.0–15.0)
WBC: 4.1 10E3/uL (ref 3.8–10.8)

## 2025-01-07 LAB — T4, FREE: Free T4: 0.91 ng/dL (ref 0.61–1.76)

## 2025-01-07 LAB — POTASSIUM: Potassium: 4 mmol/L (ref 3.5–5.3)

## 2025-01-07 LAB — VITAMIN B12: Vitamin B-12: 622 pg/mL (ref 180–914)

## 2025-01-07 LAB — ANA (IFA) WITH TITER
ANA Titer by IFA: POSITIVE — AB
Homogeneous Pattern: 1:80 {titer}

## 2025-01-07 LAB — FERRITIN: Ferritin: 135 ng/mL (ref 11.0–306.8)

## 2025-01-07 LAB — IRON STUDIES
% Iron Saturation: 54.4 % (ref 15.0–55.0)
Iron: 147 ug/dL (ref 50–212)
TIBC: 270 ug/dL (ref 265–497)

## 2025-01-07 LAB — CORTISOL: Cortisol (Immunoassay): 10.7 ug/dL

## 2025-01-07 LAB — ZINC: Zinc: 78 ug/dL (ref 44–115)

## 2025-01-07 LAB — TESTOSTERONE, FREE, TOTAL
Testosterone, Free Pct: 1.23 % (ref 0.50–2.80)
Testosterone, Free: 0.62 ng/dL (ref 0.10–0.85)
Testosterone: 50 ng/dL (ref 4–50)

## 2025-01-07 LAB — PROGESTERONE: Progesterone: 0.8 ng/mL

## 2025-01-07 LAB — FOLATE: Folic Acid: 30 ng/mL — ABNORMAL HIGH (ref 5.90–24.80)

## 2025-01-07 LAB — VITAMIN D 25 HYDROXY: Vit D, 25-Hydroxy: 62 ng/mL (ref 30.0–100.0)

## 2025-01-07 LAB — DHEA-SULFATE: DHEA Sulfate: 255 ug/dL — ABNORMAL HIGH (ref 8–188)

## 2025-01-07 LAB — TSH: TSH: 2.26 u[IU]/mL (ref 0.45–4.12)
